# Patient Record
Sex: Female | Born: 1935 | Race: Black or African American | Hispanic: No | Smoking: Never smoker
Health system: Southern US, Community
[De-identification: ages and names within clinical notes are randomized; demographics above are authoritative.]

## PROBLEM LIST (undated history)

## (undated) DIAGNOSIS — T884XXA Failed or difficult intubation, initial encounter: Secondary | ICD-10-CM

## (undated) DIAGNOSIS — T4145XA Adverse effect of unspecified anesthetic, initial encounter: Secondary | ICD-10-CM

## (undated) DIAGNOSIS — I2510 Atherosclerotic heart disease of native coronary artery without angina pectoris: Secondary | ICD-10-CM

## (undated) DIAGNOSIS — I10 Essential (primary) hypertension: Secondary | ICD-10-CM

## (undated) DIAGNOSIS — I509 Heart failure, unspecified: Secondary | ICD-10-CM

## (undated) DIAGNOSIS — K219 Gastro-esophageal reflux disease without esophagitis: Secondary | ICD-10-CM

## (undated) DIAGNOSIS — H8110 Benign paroxysmal vertigo, unspecified ear: Secondary | ICD-10-CM

## (undated) DIAGNOSIS — E669 Obesity, unspecified: Secondary | ICD-10-CM

## (undated) DIAGNOSIS — T8859XA Other complications of anesthesia, initial encounter: Secondary | ICD-10-CM

## (undated) DIAGNOSIS — F419 Anxiety disorder, unspecified: Secondary | ICD-10-CM

## (undated) DIAGNOSIS — M47812 Spondylosis without myelopathy or radiculopathy, cervical region: Secondary | ICD-10-CM

## (undated) DIAGNOSIS — R0602 Shortness of breath: Secondary | ICD-10-CM

## (undated) HISTORY — DX: Essential (primary) hypertension: I10

## (undated) HISTORY — DX: Benign paroxysmal vertigo, unspecified ear: H81.10

## (undated) HISTORY — PX: APPENDECTOMY: SHX54

## (undated) HISTORY — DX: Atherosclerotic heart disease of native coronary artery without angina pectoris: I25.10

## (undated) HISTORY — PX: ABDOMINAL HYSTERECTOMY: SHX81

## (undated) HISTORY — DX: Obesity, unspecified: E66.9

## (undated) HISTORY — PX: OTHER SURGICAL HISTORY: SHX169

## (undated) HISTORY — PX: CATARACT EXTRACTION W/ INTRAOCULAR LENS  IMPLANT, BILATERAL: SHX1307

## (undated) HISTORY — PX: CHOLECYSTECTOMY: SHX55

## (undated) HISTORY — DX: Spondylosis without myelopathy or radiculopathy, cervical region: M47.812

## (undated) HISTORY — DX: Gastro-esophageal reflux disease without esophagitis: K21.9

---

## 1998-01-13 ENCOUNTER — Inpatient Hospital Stay (HOSPITAL_COMMUNITY): Admission: EM | Admit: 1998-01-13 | Discharge: 1998-01-15 | Payer: Self-pay | Admitting: Emergency Medicine

## 1998-08-11 ENCOUNTER — Encounter: Admission: RE | Admit: 1998-08-11 | Discharge: 1998-10-21 | Payer: Self-pay | Admitting: Anesthesiology

## 1998-11-04 ENCOUNTER — Encounter: Admission: RE | Admit: 1998-11-04 | Discharge: 1999-02-02 | Payer: Self-pay | Admitting: Anesthesiology

## 1999-01-12 ENCOUNTER — Ambulatory Visit (HOSPITAL_COMMUNITY): Admission: RE | Admit: 1999-01-12 | Discharge: 1999-01-12 | Payer: Self-pay | Admitting: Orthopedic Surgery

## 1999-01-12 ENCOUNTER — Encounter: Payer: Self-pay | Admitting: Orthopedic Surgery

## 1999-04-20 ENCOUNTER — Encounter: Admission: RE | Admit: 1999-04-20 | Discharge: 1999-06-08 | Payer: Self-pay

## 1999-05-12 ENCOUNTER — Encounter: Payer: Self-pay | Admitting: Orthopedic Surgery

## 1999-05-19 ENCOUNTER — Encounter: Payer: Self-pay | Admitting: Orthopedic Surgery

## 1999-05-19 ENCOUNTER — Inpatient Hospital Stay (HOSPITAL_COMMUNITY): Admission: RE | Admit: 1999-05-19 | Discharge: 1999-05-23 | Payer: Self-pay | Admitting: Orthopedic Surgery

## 1999-06-13 ENCOUNTER — Encounter: Admission: RE | Admit: 1999-06-13 | Discharge: 1999-09-11 | Payer: Self-pay | Admitting: Orthopedic Surgery

## 1999-09-26 ENCOUNTER — Encounter: Admission: RE | Admit: 1999-09-26 | Discharge: 1999-12-25 | Payer: Self-pay | Admitting: Orthopedic Surgery

## 2000-01-18 ENCOUNTER — Emergency Department (HOSPITAL_COMMUNITY): Admission: EM | Admit: 2000-01-18 | Discharge: 2000-01-18 | Payer: Self-pay | Admitting: Emergency Medicine

## 2000-01-18 ENCOUNTER — Encounter: Payer: Self-pay | Admitting: Emergency Medicine

## 2000-06-06 ENCOUNTER — Encounter: Admission: RE | Admit: 2000-06-06 | Discharge: 2000-06-06 | Payer: Self-pay | Admitting: Orthopedic Surgery

## 2000-06-06 ENCOUNTER — Encounter: Payer: Self-pay | Admitting: Orthopedic Surgery

## 2000-08-27 HISTORY — PX: LUMBAR LAMINECTOMY: SHX95

## 2000-09-04 ENCOUNTER — Encounter: Admission: RE | Admit: 2000-09-04 | Discharge: 2000-09-04 | Payer: Self-pay | Admitting: Family Medicine

## 2000-09-04 ENCOUNTER — Encounter: Payer: Self-pay | Admitting: Family Medicine

## 2001-05-29 ENCOUNTER — Encounter: Payer: Self-pay | Admitting: Specialist

## 2001-05-29 ENCOUNTER — Ambulatory Visit (HOSPITAL_COMMUNITY): Admission: RE | Admit: 2001-05-29 | Discharge: 2001-05-29 | Payer: Self-pay | Admitting: Specialist

## 2001-07-09 ENCOUNTER — Emergency Department (HOSPITAL_COMMUNITY): Admission: EM | Admit: 2001-07-09 | Discharge: 2001-07-09 | Payer: Self-pay | Admitting: Emergency Medicine

## 2001-07-10 ENCOUNTER — Encounter: Payer: Self-pay | Admitting: Emergency Medicine

## 2001-08-27 HISTORY — PX: ROTATOR CUFF REPAIR: SHX139

## 2001-09-21 ENCOUNTER — Inpatient Hospital Stay (HOSPITAL_COMMUNITY): Admission: RE | Admit: 2001-09-21 | Discharge: 2001-09-22 | Payer: Self-pay | Admitting: Specialist

## 2002-11-12 ENCOUNTER — Encounter: Admission: RE | Admit: 2002-11-12 | Discharge: 2002-11-12 | Payer: Self-pay | Admitting: Family Medicine

## 2002-11-12 ENCOUNTER — Encounter: Payer: Self-pay | Admitting: Family Medicine

## 2003-06-22 ENCOUNTER — Encounter
Admission: RE | Admit: 2003-06-22 | Discharge: 2003-06-22 | Payer: Self-pay | Admitting: Physical Medicine and Rehabilitation

## 2003-07-04 ENCOUNTER — Inpatient Hospital Stay (HOSPITAL_COMMUNITY): Admission: EM | Admit: 2003-07-04 | Discharge: 2003-07-06 | Payer: Self-pay | Admitting: Emergency Medicine

## 2003-07-05 ENCOUNTER — Encounter (INDEPENDENT_AMBULATORY_CARE_PROVIDER_SITE_OTHER): Payer: Self-pay | Admitting: Cardiology

## 2003-12-25 ENCOUNTER — Emergency Department (HOSPITAL_COMMUNITY): Admission: EM | Admit: 2003-12-25 | Discharge: 2003-12-25 | Payer: Self-pay

## 2004-05-15 ENCOUNTER — Encounter (HOSPITAL_BASED_OUTPATIENT_CLINIC_OR_DEPARTMENT_OTHER): Admission: RE | Admit: 2004-05-15 | Discharge: 2004-05-23 | Payer: Self-pay | Admitting: Internal Medicine

## 2004-07-11 ENCOUNTER — Encounter: Admission: RE | Admit: 2004-07-11 | Discharge: 2004-07-11 | Payer: Self-pay | Admitting: Family Medicine

## 2004-07-22 ENCOUNTER — Emergency Department (HOSPITAL_COMMUNITY): Admission: EM | Admit: 2004-07-22 | Discharge: 2004-07-22 | Payer: Self-pay | Admitting: Emergency Medicine

## 2004-07-25 ENCOUNTER — Ambulatory Visit: Payer: Self-pay | Admitting: Pulmonary Disease

## 2004-08-01 ENCOUNTER — Ambulatory Visit: Payer: Self-pay | Admitting: Pulmonary Disease

## 2004-08-07 ENCOUNTER — Encounter (HOSPITAL_BASED_OUTPATIENT_CLINIC_OR_DEPARTMENT_OTHER): Admission: RE | Admit: 2004-08-07 | Discharge: 2004-08-25 | Payer: Self-pay | Admitting: Internal Medicine

## 2004-09-14 ENCOUNTER — Ambulatory Visit: Payer: Self-pay | Admitting: Internal Medicine

## 2004-09-21 ENCOUNTER — Ambulatory Visit: Payer: Self-pay | Admitting: Internal Medicine

## 2004-10-04 ENCOUNTER — Ambulatory Visit: Payer: Self-pay | Admitting: Internal Medicine

## 2004-10-10 ENCOUNTER — Ambulatory Visit: Payer: Self-pay | Admitting: Internal Medicine

## 2004-10-17 ENCOUNTER — Ambulatory Visit (HOSPITAL_COMMUNITY): Admission: RE | Admit: 2004-10-17 | Discharge: 2004-10-17 | Payer: Self-pay | Admitting: Internal Medicine

## 2004-10-20 ENCOUNTER — Ambulatory Visit: Payer: Self-pay | Admitting: Internal Medicine

## 2004-11-03 ENCOUNTER — Ambulatory Visit: Payer: Self-pay | Admitting: Internal Medicine

## 2004-12-19 ENCOUNTER — Ambulatory Visit: Payer: Self-pay | Admitting: Internal Medicine

## 2004-12-21 ENCOUNTER — Encounter
Admission: RE | Admit: 2004-12-21 | Discharge: 2005-03-21 | Payer: Self-pay | Admitting: Physical Medicine and Rehabilitation

## 2005-01-30 ENCOUNTER — Ambulatory Visit: Payer: Self-pay | Admitting: Internal Medicine

## 2005-06-08 ENCOUNTER — Ambulatory Visit: Payer: Self-pay | Admitting: Internal Medicine

## 2005-07-24 ENCOUNTER — Ambulatory Visit: Payer: Self-pay | Admitting: Internal Medicine

## 2005-09-11 ENCOUNTER — Encounter: Admission: RE | Admit: 2005-09-11 | Discharge: 2005-09-11 | Payer: Self-pay | Admitting: Family Medicine

## 2005-10-22 ENCOUNTER — Ambulatory Visit: Payer: Self-pay | Admitting: Internal Medicine

## 2006-01-15 ENCOUNTER — Ambulatory Visit: Payer: Self-pay | Admitting: Internal Medicine

## 2006-04-16 ENCOUNTER — Ambulatory Visit: Payer: Self-pay | Admitting: Internal Medicine

## 2006-07-15 ENCOUNTER — Ambulatory Visit: Payer: Self-pay | Admitting: Internal Medicine

## 2006-11-08 ENCOUNTER — Encounter: Admission: RE | Admit: 2006-11-08 | Discharge: 2006-11-08 | Payer: Self-pay | Admitting: Family Medicine

## 2007-01-14 ENCOUNTER — Ambulatory Visit: Payer: Self-pay | Admitting: Internal Medicine

## 2007-07-06 ENCOUNTER — Emergency Department (HOSPITAL_COMMUNITY): Admission: EM | Admit: 2007-07-06 | Discharge: 2007-07-06 | Payer: Self-pay | Admitting: Family Medicine

## 2007-07-09 ENCOUNTER — Encounter: Admission: RE | Admit: 2007-07-09 | Discharge: 2007-07-09 | Payer: Self-pay | Admitting: Podiatry

## 2007-07-14 ENCOUNTER — Encounter: Admission: RE | Admit: 2007-07-14 | Discharge: 2007-07-14 | Payer: Self-pay | Admitting: Interventional Radiology

## 2007-07-15 ENCOUNTER — Encounter: Admission: RE | Admit: 2007-07-15 | Discharge: 2007-07-15 | Payer: Self-pay | Admitting: Interventional Radiology

## 2007-09-14 ENCOUNTER — Emergency Department (HOSPITAL_COMMUNITY): Admission: EM | Admit: 2007-09-14 | Discharge: 2007-09-14 | Payer: Self-pay | Admitting: Family Medicine

## 2007-12-02 ENCOUNTER — Encounter: Admission: RE | Admit: 2007-12-02 | Discharge: 2007-12-02 | Payer: Self-pay | Admitting: Family Medicine

## 2007-12-18 ENCOUNTER — Encounter: Admission: RE | Admit: 2007-12-18 | Discharge: 2007-12-18 | Payer: Self-pay | Admitting: Family Medicine

## 2008-01-07 ENCOUNTER — Ambulatory Visit: Payer: Self-pay | Admitting: Internal Medicine

## 2008-01-07 DIAGNOSIS — J45909 Unspecified asthma, uncomplicated: Secondary | ICD-10-CM

## 2008-01-23 ENCOUNTER — Ambulatory Visit: Payer: Self-pay | Admitting: Internal Medicine

## 2008-12-02 ENCOUNTER — Encounter: Payer: Self-pay | Admitting: Internal Medicine

## 2008-12-02 ENCOUNTER — Encounter: Admission: RE | Admit: 2008-12-02 | Discharge: 2008-12-02 | Payer: Self-pay | Admitting: Family Medicine

## 2009-04-12 LAB — CONVERTED CEMR LAB
ALT: 15 units/L
AST: 15 units/L
Cholesterol: 192 mg/dL
Hgb A1c MFr Bld: 7 %
Total Bilirubin: 0.4 mg/dL

## 2009-04-22 ENCOUNTER — Ambulatory Visit: Payer: Self-pay | Admitting: Internal Medicine

## 2009-06-07 ENCOUNTER — Ambulatory Visit: Payer: Self-pay | Admitting: Internal Medicine

## 2009-06-07 DIAGNOSIS — R109 Unspecified abdominal pain: Secondary | ICD-10-CM | POA: Insufficient documentation

## 2009-06-07 DIAGNOSIS — K219 Gastro-esophageal reflux disease without esophagitis: Secondary | ICD-10-CM | POA: Insufficient documentation

## 2009-06-07 DIAGNOSIS — J9610 Chronic respiratory failure, unspecified whether with hypoxia or hypercapnia: Secondary | ICD-10-CM | POA: Insufficient documentation

## 2009-06-09 ENCOUNTER — Encounter: Payer: Self-pay | Admitting: Internal Medicine

## 2009-06-16 ENCOUNTER — Encounter: Payer: Self-pay | Admitting: Internal Medicine

## 2009-06-21 ENCOUNTER — Ambulatory Visit: Payer: Self-pay | Admitting: Internal Medicine

## 2009-06-22 ENCOUNTER — Ambulatory Visit: Payer: Self-pay | Admitting: Internal Medicine

## 2009-06-22 DIAGNOSIS — E785 Hyperlipidemia, unspecified: Secondary | ICD-10-CM

## 2009-06-22 DIAGNOSIS — R5383 Other fatigue: Secondary | ICD-10-CM

## 2009-06-22 DIAGNOSIS — I10 Essential (primary) hypertension: Secondary | ICD-10-CM

## 2009-06-22 DIAGNOSIS — E119 Type 2 diabetes mellitus without complications: Secondary | ICD-10-CM | POA: Insufficient documentation

## 2009-06-22 DIAGNOSIS — R5381 Other malaise: Secondary | ICD-10-CM | POA: Insufficient documentation

## 2009-06-22 LAB — CONVERTED CEMR LAB
ALT: 13 units/L (ref 0–35)
Albumin: 4 g/dL (ref 3.5–5.2)
BUN: 12 mg/dL (ref 6–23)
CO2: 24 meq/L (ref 19–32)
Calcium: 9.7 mg/dL (ref 8.4–10.5)
Chloride: 101 meq/L (ref 96–112)
Creatinine, Ser: 1.01 mg/dL (ref 0.40–1.20)
Potassium: 3.7 meq/L (ref 3.5–5.3)

## 2009-06-27 ENCOUNTER — Encounter: Payer: Self-pay | Admitting: Internal Medicine

## 2009-07-07 ENCOUNTER — Ambulatory Visit: Payer: Self-pay | Admitting: Internal Medicine

## 2009-07-07 LAB — CONVERTED CEMR LAB
OCCULT 1: NEGATIVE
OCCULT 3: NEGATIVE

## 2009-08-10 ENCOUNTER — Ambulatory Visit: Payer: Self-pay | Admitting: Internal Medicine

## 2009-08-10 ENCOUNTER — Ambulatory Visit (HOSPITAL_COMMUNITY): Admission: RE | Admit: 2009-08-10 | Discharge: 2009-08-10 | Payer: Self-pay | Admitting: Internal Medicine

## 2009-08-10 DIAGNOSIS — R142 Eructation: Secondary | ICD-10-CM

## 2009-08-10 DIAGNOSIS — R141 Gas pain: Secondary | ICD-10-CM | POA: Insufficient documentation

## 2009-08-10 DIAGNOSIS — R143 Flatulence: Secondary | ICD-10-CM

## 2009-09-01 ENCOUNTER — Encounter: Payer: Self-pay | Admitting: Internal Medicine

## 2009-09-14 ENCOUNTER — Ambulatory Visit: Payer: Self-pay | Admitting: Internal Medicine

## 2009-09-14 ENCOUNTER — Ambulatory Visit (HOSPITAL_COMMUNITY): Admission: RE | Admit: 2009-09-14 | Discharge: 2009-09-14 | Payer: Self-pay | Admitting: Internal Medicine

## 2009-09-14 DIAGNOSIS — R0602 Shortness of breath: Secondary | ICD-10-CM

## 2009-09-14 LAB — CONVERTED CEMR LAB
Basophils Absolute: 0 10*3/uL (ref 0.0–0.1)
Basophils Relative: 1 % (ref 0–1)
Cholesterol: 193 mg/dL (ref 0–200)
Hemoglobin: 12.3 g/dL (ref 12.0–15.0)
Lymphocytes Relative: 34 % (ref 12–46)
MCHC: 31.9 g/dL (ref 30.0–36.0)
Monocytes Absolute: 0.6 10*3/uL (ref 0.1–1.0)
Neutro Abs: 4.2 10*3/uL (ref 1.7–7.7)
Neutrophils Relative %: 54 % (ref 43–77)
RDW: 15 % (ref 11.5–15.5)
Triglycerides: 95 mg/dL (ref <150)
VLDL: 19 mg/dL (ref 0–40)

## 2009-09-26 ENCOUNTER — Ambulatory Visit (HOSPITAL_COMMUNITY): Admission: RE | Admit: 2009-09-26 | Discharge: 2009-09-26 | Payer: Self-pay | Admitting: Internal Medicine

## 2009-09-28 ENCOUNTER — Ambulatory Visit (HOSPITAL_COMMUNITY): Admission: RE | Admit: 2009-09-28 | Discharge: 2009-09-28 | Payer: Self-pay | Admitting: Internal Medicine

## 2009-09-28 ENCOUNTER — Encounter: Payer: Self-pay | Admitting: Internal Medicine

## 2009-10-06 ENCOUNTER — Telehealth: Payer: Self-pay | Admitting: Internal Medicine

## 2009-11-23 ENCOUNTER — Emergency Department (HOSPITAL_COMMUNITY): Admission: EM | Admit: 2009-11-23 | Discharge: 2009-11-23 | Payer: Self-pay | Admitting: Emergency Medicine

## 2009-11-23 ENCOUNTER — Emergency Department (HOSPITAL_COMMUNITY): Admission: EM | Admit: 2009-11-23 | Discharge: 2009-11-23 | Payer: Self-pay | Admitting: Family Medicine

## 2009-12-08 ENCOUNTER — Encounter: Admission: RE | Admit: 2009-12-08 | Discharge: 2009-12-08 | Payer: Self-pay | Admitting: Endocrinology

## 2010-04-07 ENCOUNTER — Ambulatory Visit: Payer: Self-pay | Admitting: Internal Medicine

## 2010-04-21 ENCOUNTER — Ambulatory Visit: Payer: Self-pay | Admitting: Internal Medicine

## 2010-06-02 ENCOUNTER — Ambulatory Visit: Payer: Self-pay | Admitting: Internal Medicine

## 2010-06-02 DIAGNOSIS — J310 Chronic rhinitis: Secondary | ICD-10-CM

## 2010-07-05 ENCOUNTER — Ambulatory Visit: Payer: Self-pay | Admitting: Internal Medicine

## 2010-08-26 ENCOUNTER — Emergency Department (HOSPITAL_COMMUNITY)
Admission: EM | Admit: 2010-08-26 | Discharge: 2010-08-26 | Payer: Self-pay | Source: Home / Self Care | Admitting: Family Medicine

## 2010-09-11 ENCOUNTER — Ambulatory Visit: Admit: 2010-09-11 | Payer: Self-pay | Admitting: Internal Medicine

## 2010-09-17 ENCOUNTER — Encounter: Payer: Self-pay | Admitting: Family Medicine

## 2010-09-18 ENCOUNTER — Encounter: Payer: Self-pay | Admitting: Internal Medicine

## 2010-09-19 ENCOUNTER — Ambulatory Visit
Admission: RE | Admit: 2010-09-19 | Discharge: 2010-09-19 | Payer: Self-pay | Source: Home / Self Care | Attending: Internal Medicine | Admitting: Internal Medicine

## 2010-09-26 NOTE — Assessment & Plan Note (Signed)
Summary: F/U/EST/VS   Vital Signs:  Patient profile:   75 year old female Height:      67 inches Weight:      220.0 pounds BMI:     34.58 O2 Sat:      99 % on Room air Temp:     97.8 degrees F oral Pulse rate:   70 / minute BP sitting:   128 / 76  (right arm)  Vitals Entered By: Filomena Jungling NT II (September 14, 2009 9:14 AM)  O2 Flow:  Room air CC: follow-up visit  and now with cough, patient has seen Dr. Evlyn Kanner Is Patient Diabetic? Yes Did you bring your meter with you today? Yes Pain Assessment Patient in pain? no      Nutritional Status BMI of > 30 = obese  Does patient need assistance? Functional Status Self care Ambulation Normal   Primary Care Provider:  Dorothe Pea  CC:  follow-up visit  and now with cough and patient has seen Dr. Evlyn Kanner.  History of Present Illness: Patient presents for follow-up of her diabetes mellitus, hypertension, hyperlipidemia, and other chronic medical problems.  Her main complaint is dyspnea aggravated by exertion which is chronic but worse over the past few months.  She reports that she ran out of her Pulmicort about one week ago and has not yet had it refilled.  She has had no further low blood sugars since I decreased her insulin dose.  She reports that she has seen her endocrinologist Dr. Evlyn Kanner, who agreed with the reduction in the insulin dose.  She has continued to have the sensation of upper abdominal fullness and discomfort.  Preventive Screening-Counseling & Management  Alcohol-Tobacco     Alcohol drinks/day: 0     Smoking Status: never  Allergies (verified): 1)  Asa 2)  Tylox 3)  Sulfa 4)  * Meds With Red Dye  Review of Systems General:  Denies chills, fever, and sweats. CV:  Complains of shortness of breath with exertion; denies chest pain or discomfort, difficulty breathing at night, difficulty breathing while lying down, and swelling of feet. Resp:  Complains of cough, shortness of breath, and wheezing; denies sputum  productive; cough has been present for 1 week. GI:  Denies abdominal pain, bloody stools, dark tarry stools, nausea, and vomiting. GU:  Denies dysuria.  Physical Exam  General:  alert, no distress Lungs:  normal respiratory effort and no crackles; there are scattered mild expiratory wheezes   Heart:  normal rate, regular rhythm, no murmur, no gallop, and no rub.   Abdomen:  soft, normal bowel sounds, no distention, and no masses; mild right upper quadrant tenderness; no apparent hepatomegaly or splenomegaly Extremities:  no edema   Impression & Recommendations:  Problem # 1:  DYSPNEA (ICD-786.05) Patient has a history of asthma, and I think her shortness of breath is likely related to inadequate control.  I advised her to have her Pulmicort inhaler refilled and to follow-up with her pulmonologist Dr. Sherene Sires.  A chest x-ray today showed no acute process, and she has no signs of congestive heart failure by exam or chest x-ray.  Orders: T-CBC w/Diff ([id number]) CXR- 2view (CXR)  Problem # 2:  ABDOMINAL PAIN, UNSPECIFIED (ICD-789.00) Patient has continued upper abdominal discomfort and a sensation of fullness, with some mild right upper quadrant tenderness on exam today.  The plan is to obtain an abdominal ultrasound.  Labs obtained at her last prior visit to work this up were unrevealing.  Her updated  medication list for this problem includes:    Gas-x Extra Strength 125 Mg Caps (Simethicone) .Marland Kitchen... Take with meals  Orders: Ultrasound (Ultrasound)  Problem # 3:  DIABETES MELLITUS, TYPE II (ICD-250.00) Patient is doing well on her current regimen.  The plan is to obtain a copy of her last office visit from Dr. Evlyn Kanner, and continue current medication regimen.  Her updated medication list for this problem includes:    Metformin Hcl 500 Mg Tabs (Metformin hcl) .Marland Kitchen... Take 1 tablet by mouth two times a day    Diovan 320 Mg Tabs (Valsartan) .Marland Kitchen... Take 1/2 tablet by mouth once a day     Humalog Mix 75/25 75-25 % Susp (Insulin lispro prot & lispro) .Marland Kitchen... 22 units at bedtime  Orders: T-Urine Microalbumin w/creat. ratio [phone])  Labs Reviewed: Creat: 1.01 (06/22/2009)    Reviewed HgBA1c results: 7.4 (06/22/2009)  7.0 (04/12/2009)  Problem # 4:  HYPERTENSION (ICD-401.9) Patient's blood pressure is well controlled on current regimen.  Plan is to continue current antihypertensive medications, and follow up blood pressure at next visit.   Her updated medication list for this problem includes:    Hydrochlorothiazide 25 Mg Tabs (Hydrochlorothiazide) .Marland Kitchen... Take 1 tablet by mouth once a day    Diovan 320 Mg Tabs (Valsartan) .Marland Kitchen... Take 1/2 tablet by mouth once a day  BP today: 128/76 Prior BP: 146/79 (08/10/2009)  Labs Reviewed: K+: 3.7 (06/22/2009) Creat: : 1.01 (06/22/2009)   Chol: 192 (04/12/2009)   HDL: 63 (04/12/2009)   LDL: 109 (04/12/2009)   TG: 98 (04/12/2009)  Problem # 5:  HYPERLIPIDEMIA (ICD-272.4) Patient is doing well on her statin medication with no apparent side effects.  The plan is to check a fasting lipid profile.  Her updated medication list for this problem includes:    Simvastatin 40 Mg Tabs (Simvastatin) .Marland Kitchen... Take 1 tab by mouth at bedtime  Orders: T-Lipid Profile [id number])  Labs Reviewed: SGOT: 12 (06/22/2009)   SGPT: 13 (06/22/2009)   HDL:63 (04/12/2009)  LDL:109 (04/12/2009)  Chol:192 (04/12/2009)  Trig:98 (04/12/2009)  Complete Medication List: 1)  Metformin Hcl 500 Mg Tabs (Metformin hcl) .... Take 1 tablet by mouth two times a day 2)  Calcium Carbonate-vitamin D 600-400 Mg-unit Tabs (Calcium carbonate-vitamin d) .... Take 1 tablet by mouth two times a day 3)  Hydrochlorothiazide 25 Mg Tabs (Hydrochlorothiazide) .... Take 1 tablet by mouth once a day 4)  Diovan 320 Mg Tabs (Valsartan) .... Take 1/2 tablet by mouth once a day 5)  Oxygen 2 Liters  .... At bedtime 6)  Humalog Mix 75/25 75-25 % Susp (Insulin lispro prot & lispro)  .... 22 units at bedtime 7)  Clonazepam 1 Mg Tabs (Clonazepam) .... Take 1 tablet by mouth two times a day 8)  Symbicort 80-4.5 Mcg/act Aero (Budesonide-formoterol fumarate) .... Inhale 2 puffs two times a day 9)  Dexilant 60 Mg Cpdr (Dexlansoprazole) .Marland Kitchen.. 1 before meal once daily 10)  Omeprazole 20 Mg Cpdr (Omeprazole) .Marland Kitchen.. 1 before meal once daily 11)  Vitamin D (ergocalciferol) 50000 Unit Caps (Ergocalciferol) .Marland Kitchen.. 1 capsule every tuesday and friday 12)  Simvastatin 40 Mg Tabs (Simvastatin) .... Take 1 tab by mouth at bedtime 13)  Citrucel 500 Mg Tabs (Methylcellulose (laxative)) .... Once daily 14)  Proair Hfa 108 (90 Base) Mcg/act Aers (Albuterol sulfate) .... 2 puffs every 4-6 hours as needed 15)  Mucinex Dm 30-600 Mg Tb12 (Dextromethorphan-guaifenesin) .Marland Kitchen.. 1-2 every 12 hours as needed 16)  Tramadol Hcl 50 Mg Tabs (Tramadol hcl) .Marland KitchenMarland KitchenMarland Kitchen  1 every 4 hours as needed 17)  Stool Softener 100 Mg Caps (Docusate sodium) .... Take 1 capsule by mouth at bedtime 18)  Miralax Powd (Polyethylene glycol 3350) .Marland Kitchen.. 1 capful at night as needed 19)  Gas-x Extra Strength 125 Mg Caps (Simethicone) .... Take with meals  Other Orders: Dexa scan (Dexa scan)  Patient Instructions: 1)  Please schedule a follow-up appointment in 1 month. 2)  Please refill your Pulmicort and use it as prescribed. 3)  Please make a follow-up appointment with Dr. Sherene Sires. 4)  Please keep appointments for abdominal ultrasound and DEXA bone density scan. Process Orders Check Orders Results:     [facility]: Check successful Tests Sent for requisitioning (September 15, 2009 7:26 PM):     09/14/2009: [facility] -- T-Urine Microalbumin w/creat. ratio [[id number]] (signed)     09/14/2009: [facility] -- T-Lipid Profile [id number] (signed)     09/14/2009: [facility] -- T-CBC w/Diff [[id number]] (signed)    Prevention & Chronic Care Immunizations   Influenza  vaccine: Fluvax 3+  (06/21/2009)   Influenza vaccine due: 04/27/2010    Tetanus booster: Not documented    Pneumococcal vaccine: Historical  (08/28/2007)    H. zoster vaccine: Not documented  Colorectal Screening   Hemoccult: Negative X3  (07/07/2009)   Hemoccult action/deferral: Ordered  (06/22/2009)    Colonoscopy: Not documented   Colonoscopy action/deferral: Deferred  (06/22/2009)  Other Screening   Pap smear: Not documented   Pap smear action/deferral: Not indicated S/P hysterectomy  (06/22/2009)    Mammogram: No specific mammographic evidence of malignancy. Assessment: Negative - BIRADS 1.   (12/02/2008)   Mammogram due: 11/2009    DXA bone density scan: Not documented   DXA bone density action/deferral: Ordered  (09/14/2009)   Smoking status: never  (09/14/2009)  Diabetes Mellitus   HgbA1C: 7.4  (06/22/2009)   HgbA1C action/deferral: Ordered  (06/22/2009)    Eye exam: Not documented    Foot exam: yes  (06/22/2009)   Foot exam action/deferral: Do today   High risk foot: No  (06/22/2009)   Foot care education: Done  (06/22/2009)    Urine microalbumin/creatinine ratio: Not documented   Urine microalbumin action/deferral: Ordered    Diabetes flowsheet reviewed?: Yes   Progress toward A1C goal: Unchanged  Lipids   Total Cholesterol: 192  (04/12/2009)   Lipid panel action/deferral: Lipid Panel ordered   LDL: 109  (04/12/2009)   LDL Direct: Not documented   HDL: 63  (04/12/2009)   Triglycerides: Not documented    SGOT (AST): 12  (06/22/2009)   SGPT (ALT): 13  (06/22/2009)   Alkaline phosphatase: 94  (06/22/2009)   Total bilirubin: 0.3  (06/22/2009)    Lipid flowsheet reviewed?: Yes   Progress toward LDL goal: Unchanged  Hypertension   Last Blood Pressure: 128 / 76  (09/14/2009)   Serum creatinine: 1.01  (06/22/2009)   Serum potassium 3.7  (06/22/2009)    Hypertension flowsheet reviewed?: Yes   Progress toward BP goal: At goal  Self-Management  Support :   Personal Goals (by the next clinic visit) :     Personal A1C goal: 7  (06/22/2009)     Personal blood pressure goal: 130/80  (06/22/2009)     Personal LDL goal: 100  (06/22/2009)    Patient will work on the following items until the next clinic visit to reach self-care goals:     Medications and monitoring: take my medicines every day, check my blood sugar  (  09/14/2009)     Eating: drink diet soda or water instead of juice or soda, eat more vegetables, use fresh or frozen vegetables, eat foods that are low in salt, eat baked foods instead of fried foods, eat fruit for snacks and desserts  (09/14/2009)     Home glucose monitoring frequency: 3 times a day  (06/22/2009)    Diabetes self-management support: Copy of home glucose meter record, Written self-care plan  (09/14/2009)   Diabetes care plan printed    Hypertension self-management support: Written self-care plan  (09/14/2009)   Hypertension self-care plan printed.    Lipid self-management support: Written self-care plan  (09/14/2009)   Lipid self-care plan printed.   Nursing Instructions: Schedule screening DXA bone density scan (see order)

## 2010-09-26 NOTE — Assessment & Plan Note (Signed)
Summary: Pulmonary/ acute ext ov with hfa now 90%   Primary Provider/Referring Provider:  Dorothe [name]  CC:  Acute visit.  Pt c/o chest tightness and cough x 1 wk- cough is prod with yellow sputum.  Has had chills but no fever.  She wheezes when lies down and sometimes with exertion and .  History of Present Illness: 75 yobf never smoker with  known history of  " Difficult t-to-control asthma," dating all the way back to the late 1990's typically flares with non-adherence with GERD rx  April 22, 2009 ov not seen > year doing well on symbicort as needed maybe once daily but once the weather got hot (x sev months)  started using  symbicort 80 4 puffs in 24 hours and just one albuterol. dyspnea and cough more daytime than night but sometimes it  wakes her  up at night recently also, no excess mucus.  rec Work on inhaler technique:  relax and blow all the way out then take a nice smooth deep breath back in, triggering the inhaler at same time you start breathing in  symbicort 80 2 puffs first thing  in am and 2 puffs again in pm about 12 hours later if needed (the second dose is optional) prevacid 15 mg Take one 30-60 min before first and last meals of the day automatically   June 21, 2009--Returns for follow up and med review. Last visit recommended to add citrucel to regimen however she forgot to go get. Finances play a big role into her meds, she can get generics rx for $5. She was started on prevacid but it is not working. Continues to have upper abd fullness, bloating, and constipation. Her meds were reviewed and pt education was given. no sob or cough  April 07, 2010  Acute visit. Pt c/o increased SOB since the Spring 2011- states that this has been worse x 1 wk with heavy feeling in chest. She gets SOB walking from one room to the next.  Heat makes breathing worse.  She also c/o dry cough- mainly at bedtime but not waking up once asleep or early in am with resp c/o's . --tx w/ steroid taper.    April 21, 2010 --Presents for follow up and med review. We reviewed his meds and updated calendar.  .  states SOB has improved since last OV, having prod cough at bedtime.Marland Kitchen Has some intermittent abdominal discomfort w/ bloating and gas. Last visit w/ asthmatic flare tx w/ steroids, she is improving w/ decreased dyspnea/cough but not back to baseline yet.   June 02, 2010 Acute visit.  Pt c/o chest tightness and cough x 1 wk- cough is prod with yellow sputum.  Has had chills but no fever.  She wheezes when lies down and sometimes with exertion.  all this occurred when ran out of ppi assoc with sensation of pnds but no noct awakening or perceived increase need for saba.  Pt denies any significant sore throat, dysphagia, itching, sneezing,  nasal congestion or excess secretions,  fever, chills, sweats, unintended wt loss, pleuritic or exertional cp, hempoptysis, change in activity tolerance  orthopnea pnd or leg swelling.  Pt also denies any obvious fluctuation in symptoms with weather or environmental change or other alleviating or aggravating factors.       Current Medications (verified): 1)  Metformin Hcl 500 Mg  Tabs (Metformin Hcl) .... Take 1 Tablet By Mouth Two Times A Day 2)  Calcium-Vitamin D 500-200 Mg-Unit Tabs (Calcium Carbonate-Vitamin D) .Marland KitchenMarland KitchenMarland Kitchen  Take One By Mouth Two Times A Day 3)  Hydrochlorothiazide 25 Mg  Tabs (Hydrochlorothiazide) .... Take 1 Tablet By Mouth Once A Day 4)  Diovan 160 Mg Tabs (Valsartan) .... Take One By Mouth Once Daily 5)  Oxygen 2 Liters .... At Bedtime 6)  Humalog Mix 75/25 75-25 % Susp (Insulin Lispro Prot & Lispro) .... 24 Units At Bedtime 7)  Clonazepam 2 Mg Tabs (Clonazepam) .... Take One By Mouth Two Times A Day 8)  Symbicort 80-4.5 Mcg/act  Aero (Budesonide-Formoterol Fumarate) .... Inhale 2 Puffs Two Times A Day 9)  Aciphex 20 Mg  Tbec (Rabeprazole Sodium) .... Take  One 30-60 Min Before First Meal of The Day Ran Out of Samples 10)  Vitamin D  (Ergocalciferol) 50000 Unit Caps (Ergocalciferol) .Marland Kitchen.. 1 Capsule Every Tuesday and Friday 11)  Simvastatin 40 Mg Tabs (Simvastatin) .... Take 1 Tab By Mouth At Bedtime 12)  Citrucel 500 Mg Tabs (Methylcellulose (Laxative)) .... Once Daily 13)  Pepcid Ac Maximum Strength 20 Mg Tabs (Famotidine) .... One At Bedtime- Not Taking 14)  Probiotic  Caps (Probiotic Product) .... Take One By Mouth Once Daily 15)  Proair Hfa 108 (90 Base) Mcg/act  Aers (Albuterol Sulfate) .... 2 Puffs Every 4-6 Hours As Needed 16)  Mucinex Dm 30-600 Mg  Tb12 (Dextromethorphan-Guaifenesin) .Marland Kitchen.. 1-2 Every 12 Hours As Needed 17)  Tramadol Hcl 50 Mg Tabs (Tramadol Hcl) .... Take One Every 4 Hrs As Needed "never Taken This- Never Called To Pharm" 18)  Stool Softener 100 Mg Caps (Docusate Sodium) .... Take 1 Capsule By Mouth At Bedtime 19)  Miralax  Powd (Polyethylene Glycol 3350) .Marland Kitchen.. 1 Capful At Night As Needed 20)  Gas-X Extra Strength 125 Mg Caps (Simethicone) .... Take With Meals 21)  Fluticasone Propionate 50 Mcg/act Susp (Fluticasone Propionate) .... 2 Sprays Each Nostril Once Daily  Allergies (verified): 1)  Asa 2)  Tylox 3)  Sulfa 4)  * Meds With Red Dye  Past History:  Past Medical History: ASTHMA (ICD-493.90)    - HFA  25% June 07, 2009  > 75% April 08, 2010 > 90% June 02, 2010      -overnight ox 10/21 2% O2 <88%, cont on on nocturnal O2 GERD  clinical dx OBESITY Meds reviewed with pt education and computerized med calendar completed/adjusted. redone June 21, 2009  Diabetes mellitus, type II Hypertension Noncritical coronary artery disease by cardiac catheterization in 1999, with branch vessel coronary artery disease affecting the first obtuse marginal branch.  Vital Signs:  Patient profile:   75 year old female Weight:      224 pounds O2 Sat:      97 % on Room air Temp:     98.3 degrees F oral Pulse rate:   66 / minute BP sitting:   108 / 64  (left arm)  Vitals Entered By: Vernie Murders  (June 02, 2010 10:30 AM)  O2 Flow:  Room air  Physical Exam  Additional Exam:  wt 222 April 22, 2009  > 224 June 07, 2009 > 218 April 08, 2010 >>221 April 21, 2010 > 224 June 02, 2010  amb hoarse bf nad  pseudowheeze resolves with purse lip maneuver  HEENT: nl dentition, turbinates, and orophanx. Nl external ear canals without cough reflex NECK :  without JVD/Nodes/TM/ nl carotid upstrokes bilaterally LUNGS: no acc muscle use, clear to A and P bilaterally without cough on insp or exp maneuvers CV:  RRR  no s3 or murmur  or increase in P2, no edema   ABD:  soft and nontender with nl excursion in the supine position. No bruits or organomegaly, bowel sounds nl, no guarding or rebound.  MS:  warm without deformities, calf tenderness, cyanosis or clubbing     Impression & Recommendations:  Problem # 1:  ASTHMA (ICD-493.90)   DDX of  difficult airways managment all start with A and  include Adherence, Ace Inhibitors, Acid Reflux, Active Sinus Disease, Alpha 1 Antitripsin deficiency, Anxiety masquerading as Airways dz,  ABPA,  allergy(esp in young), Aspiration (esp in elderly), Adverse effects of DPI,  Active smokers, plus one B  = Beta blocker use..   Adherence better to symbicort but not to ppi.  This has been a recurrent theme so try to move the mountain to [facility] and leave off ppi and try h2 two times a day on a trial basis only  I spent extra time with the patient today explaining optimal mdi  technique.  This improved from  75-90%   Each maintenance medication was reviewed in detail including most importantly the difference between maintenance and as needed and under what circumstances the prns are to be used - This was done in the context of a medication calendar review which provided the patient with a user-friendly unambiguous mechanism for medication administration and reconciliation and provides an action plan for all active problems. It is critical that this be shown to  every doctor  for modification during the office visit if necessary so the patient can use it as a working document.    Problem # 2:  GERD (ICD-530.81)  The following medications were removed from the medication list:    Aciphex 20 Mg Tbec (Rabeprazole sodium) .Marland Kitchen... Take  one 30-60 min before first meal of the day ran out of samples Her updated medication list for this problem includes:    Pepcid Ac Maximum Strength 20 Mg Tabs (Famotidine) ..... One after breakfast and one at bedtime  Diet also reviewed.   Orders: Est. Patient Level IV (41324)  Problem # 3:  CHRONIC RHINITIS (ICD-472.0)  On flonase trial for what amounts to a form of what is likely Upper airway cough syndrome, so named because it's frequently impossible to sort out how much is  CR/sinusitis with freq throat clearing (which can be related to primary GERD)   vs  causing  secondary extra esophageal GERD from wide swings in gastric pressure that occur with throat clearing, promoting self use of mint and menthol lozenges that reduce the lower esophageal sphincter tone and exacerbate the problem further These are the same pts who not infrequently have failed to tolerate ace inhibitors,  dry powder inhalers or biphosphonates or report having reflux symptoms that don't respond to standard doses of PPI   For now leave on nasal steroids with low threshold to treat gerd more aggressively if symptoms of pnds persist on pepcid plus flonase  Orders: Est. Patient Level IV (40102)  Medications Added to Medication List This Visit: 1)  Aciphex 20 Mg Tbec (Rabeprazole sodium) .... Take  one 30-60 min before first meal of the day ran out of samples 2)  Pepcid Ac Maximum Strength 20 Mg Tabs (Famotidine) .... One at bedtime- not taking 3)  Pepcid Ac Maximum Strength 20 Mg Tabs (Famotidine) .... One after breakfast and one at bedtime 4)  Tramadol Hcl 50 Mg Tabs (Tramadol hcl) .Marland Kitchen.. 1 - 2 every 4 hours for cough 5)  Tramadol Hcl 50 Mg Tabs  (  Tramadol hcl) .... Take one every 4 hrs as needed "never taken this- never called to pharm" 6)  Fluticasone Propionate 50 Mcg/act Susp (Fluticasone propionate) .... 2 sprays each nostril once daily 7)  Prednisone 10 Mg Tabs (Prednisone) .... 4 each am x 2days, 2x2days, 1x2days and stop  Other Orders: Prescription Created Electronically [phone]) HFA Instruction [phone])  Patient Instructions: 1)  Ok to leave off aciphex 2)  Increase pepcid to 20 mg twice daily 3)  Continue flonase one twice daily 4)  for cough mucinex dm up 2  every 12 hours and if still coughing use tramadol 5)  Prednisone 4 each am x 2days, 2x2days, 1x2days and stop 6)  See calendar for specific medication instructions and bring it back for each and every office visit for every healthcare provider you see.  Without it,  you may not receive the best quality medical care that we feel you deserve.  7)  See Tammy NP w/in 4 weeks with all your medications, even over the counter meds, separated in two separate bags, the ones you take no matter what vs the ones you stop once you feel better and take only as needed.  She will generate for you a new user friendly medication calendar that will put [place] all on the same page re: your medication use.  Prescriptions: PREDNISONE 10 MG  TABS (PREDNISONE) 4 each am x 2days, 2x2days, 1x2days and stop  #14 x 0   Entered and Authorized by:   Nyoka Cowden MD   Signed by:   Nyoka Cowden MD on 06/02/2010   Method used:   Electronically to        [facility]  [address] [phone]* (retail)       [address]       [place], [place]  [id number]       Ph: [phone] or [phone]       Fax: [phone]   RxID:   [id number] TRAMADOL HCL 50 MG TABS (TRAMADOL HCL) 1 - 2 every 4 hours for cough  #40 x 0   Entered and Authorized by:   Nyoka Cowden MD   Signed by:   Nyoka Cowden MD on 06/02/2010   Method used:   [facility] to        [facility]  [address]  [phone]* (retail)       [address]       [place], [place]  [id number]       Ph: [phone] or [phone]       Fax: [phone]   RxID:   [id number]

## 2010-09-26 NOTE — Progress Notes (Signed)
Summary: TEST RESULTS  Phone Note Call from Patient Call back at Home Phone [phone]   Reason for Call: Talk to Doctor, Lab or Test Results Summary of Call: MRS. Tzeng CALLED WOULD LIKE FOR DR.Evanny Ellerbe TO CALL HER ABOUT TEST RESULTS THAT SHE HAD DONE. I WILL GET IN TOUCH WITH DR. Meredith Pel FOR THE PATIENT. Initial call taken by: Filomena Jungling NT II,  October 06, 2009 4:15 PM  Follow-up for Phone Call        I called patient and discussed the results of her recent DEXA scan and abdominal ultrasound.  She will follow up with me on February 23rd. Follow-up by: Margarito Liner MD,  October 06, 2009 5:05 PM

## 2010-09-26 NOTE — Assessment & Plan Note (Signed)
Summary: Pulmonary/ acute ext ov with hfa 75% effective   Primary Annikah Lovins/Referring Pericles Carmicheal:  Dorothe Pea  CC:  Acute visit. Pt c/o increased SOB since the Spring 2011- states that this has been worse x 1 wk with heavy feeling in chest. She gets SOB walking from one room to the next.  Heat makes breathing worse.  She also c/o dry cough- mainly at bedtime.  Marland Kitchen  History of Present Illness: 75 yobf never smoker with  known history of  " Difficult t-to-control asthma," dating all the way back to the late 1990's typically flares with non-adherence with GERD rx  April 22, 2009 ov not seen > year doing well on symbicort as needed maybe once daily but once the weather got hot (x sev months)  started using  symbicort 80 4 puffs in 24 hours and just one albuterol. dyspnea and cough more daytime than night but sometimes it  wakes her  up at night recently also, no excess mucus.  rec Work on inhaler technique:  relax and blow all the way out then take a nice smooth deep breath back in, triggering the inhaler at same time you start breathing in  symbicort 80 2 puffs first thing  in am and 2 puffs again in pm about 12 hours later if needed (the second dose is optional) prevacid 15 mg Take one 30-60 min before first and last meals of the day automatically   June 21, 2009--Returns for follow up and med review. Last visit recommended to add citrucel to regimen however she forgot to go get. Finances play a big role into her meds, she can get generics rx for $5. She was started on prevacid but it is not working. Continues to have upper abd fullness, bloating, and constipation. Her meds were reviewed and pt education was given. no sob or cough  April 07, 2010  Acute visit. Pt c/o increased SOB since the Spring 2011- states that this has been worse x 1 wk with heavy feeling in chest. She gets SOB walking from one room to the next.  Heat makes breathing worse.  She also c/o dry cough- mainly at bedtime but not waking  up once asleep or early in am with resp c/o's .  no purulent secretions. confused again with meds, lost calendar. Not able to confirm med rec    Current Medications (verified): 1)  Metformin Hcl 500 Mg  Tabs (Metformin Hcl) .... Take 1 Tablet By Mouth Two Times A Day 2)  Hydrochlorothiazide 25 Mg  Tabs (Hydrochlorothiazide) .... Take 1 Tablet By Mouth Once A Day 3)  Diovan 320 Mg Tabs (Valsartan) .... Take 1/2 Tablet By Mouth Once A Day 4)  Oxygen 2 Liters .... At Bedtime 5)  Humalog Mix 75/25 75-25 % Susp (Insulin Lispro Prot & Lispro) .... 24 Units At Bedtime 6)  Clonazepam 1 Mg  Tabs (Clonazepam) .... Take 1 Tablet By Mouth Two Times A Day 7)  Symbicort 80-4.5 Mcg/act  Aero (Budesonide-Formoterol Fumarate) .... Inhale 2 Puffs Two Times A Day 8)  Vitamin D (Ergocalciferol) 50000 Unit Caps (Ergocalciferol) .Marland Kitchen.. 1 Capsule Every Tuesday and Friday 9)  Simvastatin 40 Mg Tabs (Simvastatin) .... Take 1 Tab By Mouth At Bedtime 10)  Citrucel 500 Mg Tabs (Methylcellulose (Laxative)) .... Once Daily 11)  Proair Hfa 108 (90 Base) Mcg/act  Aers (Albuterol Sulfate) .... 2 Puffs Every 4-6 Hours As Needed 12)  Mucinex Dm 30-600 Mg  Tb12 (Dextromethorphan-Guaifenesin) .Marland Kitchen.. 1-2 Every 12 Hours As Needed 13)  Stool Softener 100 Mg Caps (Docusate Sodium) .... Take 1 Capsule By Mouth At Bedtime 14)  Miralax  Powd (Polyethylene Glycol 3350) .Marland Kitchen.. 1 Capful At Night As Needed 15)  Gas-X Extra Strength 125 Mg Caps (Simethicone) .... Take With Meals  Allergies (verified): 1)  Asa 2)  Tylox 3)  Sulfa 4)  * Meds With Red Dye  Past History:  Past Medical History: ASTHMA (ICD-493.90)    - HFA  25% June 07, 2009  > 75% April 08, 2010      -overnight ox 10/21 2% O2 <88%, cont on on nocturnal O2.  GERD  clinical dx OBESITY Meds reviewed with pt education and computerized med calendar completed/adjusted. redone June 21, 2009 Flu shot June 21, 2009  Diabetes mellitus, type II Hypertension Noncritical  coronary artery disease by cardiac catheterization in 1999, with branch vessel coronary artery disease affecting the first obtuse marginal branch.  Vital Signs:  Patient profile:   75 year old female Weight:      218.25 pounds O2 Sat:      97 % on Room air Temp:     98.2 degrees F oral Pulse rate:   85 / minute BP sitting:   122 / 62  (left arm)  Vitals Entered By: Vernie Murders (April 07, 2010 10:08 AM)  O2 Flow:  Room air  Physical Exam  Additional Exam:  wt 222 April 22, 2009  > 224 June 07, 2009 > 218 April 08, 2010  amb hoarse bf nad  pseudowheeze resolves with purse lip maneuver  HEENT: nl dentition, turbinates, and orophanx. Nl external ear canals without cough reflex NECK :  without JVD/Nodes/TM/ nl carotid upstrokes bilaterally LUNGS: no acc muscle use, clear to A and P bilaterally without cough on insp or exp maneuvers CV:  RRR  no s3 or murmur or increase in P2, no edema   ABD:  soft and nontender with nl excursion in the supine position. No bruits or organomegaly, bowel sounds nl, no guarding or rebound.  MS:  warm without deformities, calf tenderness, cyanosis or clubbing     Impression & Recommendations:  Problem # 1:  ASTHMA (ICD-493.90) DDX of  difficult airways managment all start with A and  include Adherence, Ace Inhibitors, Acid Reflux, Active Sinus Disease, Alpha 1 Antitripsin deficiency, Anxiety masquerading as Airways dz,  ABPA,  allergy(esp in young), Aspiration (esp in elderly), Adverse effects of DPI,  Active smokers, plus one B  = Beta blocker use..   Acid reflux has always played a major role in her symptoms and typically once this is addressed she is no longer "difficult to control"  Adherence clearly the greatest concern. I spent extra time with the patient today explaining optimal mdi  technique.  This improved from  50-75% Each maintenance medication was reviewed in detail including most importantly the difference between maintenance and as  needed and under what circumstances the prns are to be used.  To keep things simple, I have asked the patient to first separate medicines that are perceived as maintenance, that is to be taken daily "no matter what", from those medicines that are taken on only on an as-needed basis and I have given the patient examples of both, and then return to see our NP to generate a yet another  medication calendar which should be followed until the next physician sees the patient and updates it.   Once we're sure that we're all reading from the same page in terms of medication  admiistration, she needs to be scheduled to follow up with me with pft's.  Medications Added to Medication List This Visit: 1)  Humalog Mix 75/25 75-25 % Susp (Insulin lispro prot & lispro) .... 24 units at bedtime 2)  Aciphex 20 Mg Tbec (Rabeprazole sodium) .... Take  one 30-60 min before first meal of the day 3)  Pepcid Ac Maximum Strength 20 Mg Tabs (Famotidine) .... One at bedtime 4)  Prednisone 10 Mg Tabs (Prednisone) .... 4 each am x 2days, 2x2days, 1x2days and stop  Other Orders: Est. Patient Level IV (14431) HFA Instruction [phone])  Patient Instructions: 1)  Prednisone 4 each am x 2days, 2x2days, 1x2days and stop 2)  Aciphex 20 mg Take  one 30-60 min before first meal of the day and pepcid 20 mg at bedtime 3)  GERD (REFLUX)  is a common cause of respiratory symptoms. It commonly presents without heartburn and can be treated with medication, but also with lifestyle changes including avoidance of late meals, excessive alcohol, smoking cessation, and avoid fatty foods, chocolate, peppermint, colas, red wine, and acidic juices such as orange juice. NO MINT OR MENTHOL PRODUCTS SO NO COUGH DROPS  4)  USE SUGARLESS CANDY INSTEAD (jolley ranchers)  5)  NO OIL BASED VITAMINS  6)  Work on inhaler technique:  relax and blow all the way out then take a nice smooth deep breath back in, triggering the inhaler at same time you start breathing  in and hold a few seconds 7)  See Tammy NP w/in 2 weeks with all your medications, even over the counter meds, separated in two separate bags, the ones you take no matter what vs the ones you stop once you feel better and take only as needed.  She will generate for you a new user friendly medication calendar that will put [place] all on the same page re: your medication use.  Prescriptions: PREDNISONE 10 MG  TABS (PREDNISONE) 4 each am x 2days, 2x2days, 1x2days and stop  #14 x 0   Entered and Authorized by:   Nyoka Cowden MD   Signed by:   Nyoka Cowden MD on 04/07/2010   Method used:   [facility]        [facility]  [address] [phone][facility] (retail)       [address]       [place], [place]  [id number]       Ph: [phone] or [phone]       Fax: [phone]   RxID:   [id number]

## 2010-09-26 NOTE — Assessment & Plan Note (Signed)
Summary: NP follow up - med calendar   Primary Provider/Referring Provider:  Dorothe Pea  CC:  est med calendar - pt brought all meds with her today.  c/o poss thrush from symbicort and sttaes rinses her mouth "most of the time.".  History of Present Illness: 3 yobf never smoker with  known history of  " Difficult t-to-control asthma," dating all the way back to the late 1990's typically flares with non-adherence with GERD rx  April 22, 2009 ov not seen > year doing well on symbicort as needed maybe once daily but once the weather got hot (x sev months)  started using  symbicort 80 4 puffs in 24 hours and just one albuterol. dyspnea and cough more daytime than night but sometimes it  wakes her  up at night recently also, no excess mucus.  rec Work on inhaler technique:  relax and blow all the way out then take a nice smooth deep breath back in, triggering the inhaler at same time you start breathing in  symbicort 80 2 puffs first thing  in am and 2 puffs again in pm about 12 hours later if needed (the second dose is optional) prevacid 15 mg Take one 30-60 min before first and last meals of the day automatically   June 21, 2009--Returns for follow up and med review. Last visit recommended to add citrucel to regimen however she forgot to go get. Finances play a big role into her meds, she can get generics rx for $5. She was started on prevacid but it is not working. Continues to have upper abd fullness, bloating, and constipation. Her meds were reviewed and pt education was given. no sob or cough  April 07, 2010  Acute visit. Pt c/o increased SOB since the Spring 2011- states that this has been worse x 1 wk with heavy feeling in chest. She gets SOB walking from one room to the next.  Heat makes breathing worse.  She also c/o dry cough- mainly at bedtime but not waking up once asleep or early in am with resp c/o's . --tx w/ steroid taper.   PAGE 2>>>>>>>>>April 21, 2010 --Presents for follow up  and med review. We reviewed his meds and updated calendar.  .  states SOB has improved since last OV, having prod cough at bedtime.Marland Kitchen Has some intermittent abdominal discomfort w/ bloating and gas. Last visit w/ asthmatic flare tx w/ steroids, she is improving w/ decreased dyspnea/cough but not back to baseline yet.   June 02, 2010 Acute visit.  Pt c/o chest tightness and cough x 1 wk- cough is prod with yellow sputum.  Has had chills but no fever.  She wheezes when lies down and sometimes with exertion.  all this occurred when ran out of ppi assoc with sensation of pnds but no noct awakening or perceived increase need for saba.   >>tx steroid taper for exacerbation. and pepcid increased.   July 05, 2010 --Presents for a follow up and med review. She is feeling much better from asthma flare last ov. She has finished her steroid taper. No overt reflux since increasing pepcid two times a day , no longer on PPI. Rare use of Proair since last ov.  Her rx coverage is okay , she has not filled rx for diovan or symbicort we sent rx to pharm today to make sure she can afford copay.  We reviewed all her meds and organized her med calendar. She uses her calendar and takes meds  from bottle each day. Does not use pill box. Denies chest pain, orthopnea, hemoptysis, fever, n/v/d, edema, headache.      Medications Prior to Update: 1)  Metformin Hcl 500 Mg  Tabs (Metformin Hcl) .... Take 1 Tablet By Mouth Two Times A Day 2)  Calcium-Vitamin D 500-200 Mg-Unit Tabs (Calcium Carbonate-Vitamin D) .... Take One By Mouth Two Times A Day 3)  Hydrochlorothiazide 25 Mg  Tabs (Hydrochlorothiazide) .... Take 1 Tablet By Mouth Once A Day 4)  Diovan 160 Mg Tabs (Valsartan) .... Take One By Mouth Once Daily 5)  Oxygen 2 Liters .... At Bedtime 6)  Humalog Mix 75/25 75-25 % Susp (Insulin Lispro Prot & Lispro) .... 24 Units At Bedtime 7)  Clonazepam 2 Mg Tabs (Clonazepam) .... Take One By Mouth Two Times A Day 8)  Symbicort  80-4.5 Mcg/act  Aero (Budesonide-Formoterol Fumarate) .... Inhale 2 Puffs Two Times A Day 9)  Vitamin D (Ergocalciferol) 50000 Unit Caps (Ergocalciferol) .Marland Kitchen.. 1 Capsule Every Tuesday and Friday 10)  Simvastatin 40 Mg Tabs (Simvastatin) .... Take 1 Tab By Mouth At Bedtime 11)  Citrucel 500 Mg Tabs (Methylcellulose (Laxative)) .... Once Daily 12)  Pepcid Ac Maximum Strength 20 Mg Tabs (Famotidine) .... One After Breakfast and One At Bedtime 13)  Probiotic  Caps (Probiotic Product) .... Take One By Mouth Once Daily 14)  Proair Hfa 108 (90 Base) Mcg/act  Aers (Albuterol Sulfate) .... 2 Puffs Every 4-6 Hours As Needed 15)  Mucinex Dm 30-600 Mg  Tb12 (Dextromethorphan-Guaifenesin) .Marland Kitchen.. 1-2 Every 12 Hours As Needed 16)  Tramadol Hcl 50 Mg Tabs (Tramadol Hcl) .Marland Kitchen.. 1 - 2 Every 4 Hours For Cough 17)  Stool Softener 100 Mg Caps (Docusate Sodium) .... Take 1 Capsule By Mouth At Bedtime 18)  Miralax  Powd (Polyethylene Glycol 3350) .Marland Kitchen.. 1 Capful At Night As Needed 19)  Gas-X Extra Strength 125 Mg Caps (Simethicone) .... Take With Meals 20)  Fluticasone Propionate 50 Mcg/act Susp (Fluticasone Propionate) .... 2 Sprays Each Nostril Once Daily 21)  Prednisone 10 Mg  Tabs (Prednisone) .... 4 Each Am X 2days, 2x2days, 1x2days and Stop  Allergies (verified): 1)  Asa 2)  Tylox 3)  Sulfa 4)  * Meds With Red Dye  Past History:  Past Medical History: Last updated: 06/02/2010 ASTHMA (ICD-493.90)    - HFA  25% June 07, 2009  > 75% April 08, 2010 > 90% June 02, 2010      -overnight ox 10/21 2% O2 <88%, cont on on nocturnal O2 GERD  clinical dx OBESITY Meds reviewed with pt education and computerized med calendar completed/adjusted. redone June 21, 2009  Diabetes mellitus, type II Hypertension Noncritical coronary artery disease by cardiac catheterization in 1999, with branch vessel coronary artery disease affecting the first obtuse marginal branch.  Past Surgical History: Last updated:  06/22/2009 Cholecystectomy Right rotator cuff repair in 2003 Appendectomy Hysterectomy Lumbar laminectomy in 2002.  She reports chronic left leg numbness and tingling since then.  Family History: Last updated: 06/22/2009 Family history of asthma, heart diease, clotting diorders, and cancer Mother deceased at age 68 due to cancer (unsure what type) Father deceased at age 53 due to heart disease Sister had breast cancer in her 30s.  Social History: Last updated: 06/22/2009 Patient is retired Married with 2 children Never smoked Diabetic  Risk Factors: Smoking Status: never (09/14/2009)  Review of Systems      See HPI  Vital Signs:  Patient profile:   75 year old female  Height:      67 inches Weight:      222.25 pounds BMI:     34.94 O2 Sat:      97 % on Room air Temp:     98.9 degrees F oral Pulse rate:   71 / minute BP sitting:   138 / 78  (left arm) Cuff size:   large  Vitals Entered By: Boone Master CNA/MA (July 05, 2010 9:14 AM)  O2 Flow:  Room air CC: est med calendar - pt brought all meds with her today.  c/o poss thrush from symbicort, sttaes rinses her mouth "most of the time." Is Patient Diabetic? Yes Comments Medications reviewed with patient Daytime contact number verified with patient. Boone Master CNA/MA  July 05, 2010 9:14 AM    Physical Exam  Additional Exam:  wt 222 April 22, 2009  > 224 June 07, 2009 > 218 April 08, 2010 >>221 April 21, 2010 > 224 June 02, 2010 >>222 July 05, 2010  amb hoarse bf nad  HEENT: nl dentition, turbinates, and orophanx. Nl external ear canals without cough reflex NECK :  without JVD/Nodes/TM/ nl carotid upstrokes bilaterally LUNGS: no acc muscle use, clear to A and P bilaterally without cough on insp or exp maneuvers CV:  RRR  no s3 or murmur or increase in P2, no edema   ABD:  soft and nontender with nl excursion in the supine position. No bruits or organomegaly, bowel sounds nl, no guarding or  rebound.  MS:  warm without deformities, calf tenderness, cyanosis or clubbing     Impression & Recommendations:  Problem # 1:  ASTHMA (ICD-493.90) Recent flare now resolved  Meds reviewed with pt education and computerized med calendar adjusted.   Problem # 2:  GERD (ICD-530.81)  controlled on rx  Her updated medication list for this problem includes:    Pepcid Ac Maximum Strength 20 Mg Tabs (Famotidine) ..... One after breakfast and one at bedtime  Orders: Est. Patient Level III (42706)  Patient Instructions: 1)  Continue on same meds.  2)  Follow med calendar closely and bring to each visit.  3)  follow up Dr. Sherene Sires in 3 months and as needed  Prescriptions: SYMBICORT 80-4.5 MCG/ACT  AERO (BUDESONIDE-FORMOTEROL FUMARATE) Inhale 2 puffs two times a day  #1 x 5   Entered and Authorized by:   Rubye Oaks NP   Signed by:   Rubye Oaks NP on 07/05/2010   Method used:   [facility] [phone][facility] (retail)       [address]       [place], [place]  [address]       Ph: [phone] or [phone]       Fax: [phone]   RxID:   [id number] DIOVAN 160 MG TABS (VALSARTAN) Take one by mouth once daily  #30 x 5   Entered and Authorized by:   Rubye Oaks NP   Signed by:   Rubye Oaks NP on 07/05/2010   Method used:   [facility]  [address] [phone]* (retail)       [address]       [place], [place]  [id number]       Ph: [phone] or [phone]  Fax: [phone]   RxID:   [id number]    Immunization History:  Influenza Immunization History:    Influenza:  historical (05/27/2010)   Appended Document: med calendar update    Clinical Lists Changes  Medications: Changed medication from HYDROCHLOROTHIAZIDE 25 MG  TABS (HYDROCHLOROTHIAZIDE) Take 1 tablet by mouth once a day to HYDROCHLOROTHIAZIDE 50 MG TABS (HYDROCHLOROTHIAZIDE) 1/2 tab by  mouth once daily Changed medication from CLONAZEPAM 2 MG TABS (CLONAZEPAM) Take one by mouth two times a day to CLONAZEPAM 2 MG TABS (CLONAZEPAM) Take one by mouth  every morning and at bedtime Changed medication from FLUTICASONE PROPIONATE 50 MCG/ACT SUSP (FLUTICASONE PROPIONATE) 2 sprays each nostril once daily to FLUTICASONE PROPIONATE 50 MCG/ACT SUSP (FLUTICASONE PROPIONATE) 1 puff each nostril  every morning and at bedtime Changed medication from PROAIR HFA 108 (90 BASE) MCG/ACT  AERS (ALBUTEROL SULFATE) 2 puffs every 4-6 hours as needed to PROAIR HFA 108 (90 BASE) MCG/ACT  AERS (ALBUTEROL SULFATE) 1-2 puffs every 4-6 hours as needed Changed medication from STOOL SOFTENER 100 MG CAPS (DOCUSATE SODIUM) Take 1 capsule by mouth at bedtime to STOOL SOFTENER 100 MG CAPS (DOCUSATE SODIUM) 1-2 capsules by mouth at bedtime as needed Changed medication from MIRALAX  POWD (POLYETHYLENE GLYCOL 3350) 1 capful at night as needed to MIRALAX  POWD (POLYETHYLENE GLYCOL 3350) 1 capful two times a day as needed Removed medication of PREDNISONE 10 MG  TABS (PREDNISONE) 4 each am x 2days, 2x2days, 1x2days and stop

## 2010-09-26 NOTE — Consult Note (Signed)
Summary: Ophthalmology  Ophthalmology   Imported By: Florinda Marker 09/01/2009 08:43:53  _____________________________________________________________________  External Attachment:    Type:   Image     Comment:   External Document  Appended Document: Ophthalmology   Diabetic Eye Exam  Procedure date:  10/11/2008  Findings:      Exam by Dr. Elmer Picker (see report)  Procedures Next Due Date:    Diabetic Eye Exam: 02/2009

## 2010-09-26 NOTE — Assessment & Plan Note (Signed)
Summary: NP follow up - med calendar   Primary Provider/Referring Provider:  Dorothe Pea  CC:  follow up and med review. .  History of Present Illness: 89 yobf never smoker with  known history of  " Difficult t-to-control asthma," dating all the way back to the late 1990's typically flares with non-adherence with GERD rx  April 22, 2009 ov not seen > year doing well on symbicort as needed maybe once daily but once the weather got hot (x sev months)  started using  symbicort 80 4 puffs in 24 hours and just one albuterol. dyspnea and cough more daytime than night but sometimes it  wakes her  up at night recently also, no excess mucus.  rec Work on inhaler technique:  relax and blow all the way out then take a nice smooth deep breath back in, triggering the inhaler at same time you start breathing in  symbicort 80 2 puffs first thing  in am and 2 puffs again in pm about 12 hours later if needed (the second dose is optional) prevacid 15 mg Take one 30-60 min before first and last meals of the day automatically   June 21, 2009--Returns for follow up and med review. Last visit recommended to add citrucel to regimen however she forgot to go get. Finances play a big role into her meds, she can get generics rx for $5. She was started on prevacid but it is not working. Continues to have upper abd fullness, bloating, and constipation. Her meds were reviewed and pt education was given. no sob or cough  April 07, 2010  Acute visit. Pt c/o increased SOB since the Spring 2011- states that this has been worse x 1 wk with heavy feeling in chest. She gets SOB walking from one room to the next.  Heat makes breathing worse.  She also c/o dry cough- mainly at bedtime but not waking up once asleep or early in am with resp c/o's . --tx w/ steroid taper.    April 21, 2010 --Presents for follow up and med review. We reviewed his meds and updated calendar.  .  states SOB has improved since last OV, having prod cough at  bedtime.Marland Kitchen Has some intermittent abdominal discomfort w/ bloating and gas. Last visit w/ asthmatic flare tx w/ steroids, she is improving w/ decreased dyspnea/cough but not back to baseline yet. Denies chest pain, orthopnea, hemoptysis, fever, n/v/d, edema, headache.   Medications Prior to Update: 1)  Metformin Hcl 500 Mg  Tabs (Metformin Hcl) .... Take 1 Tablet By Mouth Two Times A Day 2)  Hydrochlorothiazide 25 Mg  Tabs (Hydrochlorothiazide) .... Take 1 Tablet By Mouth Once A Day 3)  Diovan 320 Mg Tabs (Valsartan) .... Take 1/2 Tablet By Mouth Once A Day 4)  Oxygen 2 Liters .... At Bedtime 5)  Humalog Mix 75/25 75-25 % Susp (Insulin Lispro Prot & Lispro) .... 24 Units At Bedtime 6)  Clonazepam 1 Mg  Tabs (Clonazepam) .... Take 1 Tablet By Mouth Two Times A Day 7)  Symbicort 80-4.5 Mcg/act  Aero (Budesonide-Formoterol Fumarate) .... Inhale 2 Puffs Two Times A Day 8)  Aciphex 20 Mg  Tbec (Rabeprazole Sodium) .... Take  One 30-60 Min Before First Meal of The Day 9)  Vitamin D (Ergocalciferol) 50000 Unit Caps (Ergocalciferol) .Marland Kitchen.. 1 Capsule Every Tuesday and Friday 10)  Simvastatin 40 Mg Tabs (Simvastatin) .... Take 1 Tab By Mouth At Bedtime 11)  Citrucel 500 Mg Tabs (Methylcellulose (Laxative)) .... Once Daily 12)  Pepcid Ac Maximum Strength 20 Mg Tabs (Famotidine) .... One At Bedtime 13)  Proair Hfa 108 (90 Base) Mcg/act  Aers (Albuterol Sulfate) .... 2 Puffs Every 4-6 Hours As Needed 14)  Mucinex Dm 30-600 Mg  Tb12 (Dextromethorphan-Guaifenesin) .Marland Kitchen.. 1-2 Every 12 Hours As Needed 15)  Stool Softener 100 Mg Caps (Docusate Sodium) .... Take 1 Capsule By Mouth At Bedtime 16)  Miralax  Powd (Polyethylene Glycol 3350) .Marland Kitchen.. 1 Capful At Night As Needed 17)  Gas-X Extra Strength 125 Mg Caps (Simethicone) .... Take With Meals 18)  Prednisone 10 Mg  Tabs (Prednisone) .... 4 Each Am X 2days, 2x2days, 1x2days and Stop  Current Medications (verified): 1)  Metformin Hcl 500 Mg  Tabs (Metformin Hcl) .... Take  1 Tablet By Mouth Two Times A Day 2)  Calcium-Vitamin D 500-200 Mg-Unit Tabs (Calcium Carbonate-Vitamin D) .... Take One By Mouth Two Times A Day 3)  Hydrochlorothiazide 25 Mg  Tabs (Hydrochlorothiazide) .... Take 1 Tablet By Mouth Once A Day 4)  Diovan 160 Mg Tabs (Valsartan) .... Take One By Mouth Once Daily 5)  Oxygen 2 Liters .... At Bedtime 6)  Humalog Mix 75/25 75-25 % Susp (Insulin Lispro Prot & Lispro) .... 24 Units At Bedtime 7)  Clonazepam 2 Mg Tabs (Clonazepam) .... Take One By Mouth Two Times A Day 8)  Symbicort 80-4.5 Mcg/act  Aero (Budesonide-Formoterol Fumarate) .... Inhale 2 Puffs Two Times A Day 9)  Aciphex 20 Mg  Tbec (Rabeprazole Sodium) .... Take  One 30-60 Min Before First Meal of The Day 10)  Vitamin D (Ergocalciferol) 50000 Unit Caps (Ergocalciferol) .Marland Kitchen.. 1 Capsule Every Tuesday and Friday 11)  Simvastatin 40 Mg Tabs (Simvastatin) .... Take 1 Tab By Mouth At Bedtime 12)  Citrucel 500 Mg Tabs (Methylcellulose (Laxative)) .... Once Daily 13)  Pepcid Ac Maximum Strength 20 Mg Tabs (Famotidine) .... One At Bedtime 14)  Probiotic  Caps (Probiotic Product) .... Take One By Mouth Once Daily 15)  Proair Hfa 108 (90 Base) Mcg/act  Aers (Albuterol Sulfate) .... 2 Puffs Every 4-6 Hours As Needed 16)  Mucinex Dm 30-600 Mg  Tb12 (Dextromethorphan-Guaifenesin) .Marland Kitchen.. 1-2 Every 12 Hours As Needed 17)  Tramadol Hcl 50 Mg Tabs (Tramadol Hcl) .... Take One Every 4 Hrs As Needed 18)  Stool Softener 100 Mg Caps (Docusate Sodium) .... Take 1 Capsule By Mouth At Bedtime 19)  Miralax  Powd (Polyethylene Glycol 3350) .Marland Kitchen.. 1 Capful At Night As Needed 20)  Gas-X Extra Strength 125 Mg Caps (Simethicone) .... Take With Meals  Allergies (verified): 1)  Asa 2)  Tylox 3)  Sulfa 4)  * Meds With Red Dye  Past History:  Past Medical History: Last updated: 04/07/2010 ASTHMA (ICD-493.90)    - HFA  25% June 07, 2009  > 75% April 08, 2010      -overnight ox 10/21 2% O2 <88%, cont on on nocturnal  O2.  GERD  clinical dx OBESITY Meds reviewed with pt education and computerized med calendar completed/adjusted. redone June 21, 2009 Flu shot June 21, 2009  Diabetes mellitus, type II Hypertension Noncritical coronary artery disease by cardiac catheterization in 1999, with branch vessel coronary artery disease affecting the first obtuse marginal branch.  Past Surgical History: Last updated: 06/22/2009 Cholecystectomy Right rotator cuff repair in 2003 Appendectomy Hysterectomy Lumbar laminectomy in 2002.  She reports chronic left leg numbness and tingling since then.  Family History: Last updated: 06/22/2009 Family history of asthma, heart diease, clotting diorders, and cancer Mother deceased at  age 8 due to cancer (unsure what type) Father deceased at age 29 due to heart disease Sister had breast cancer in her 75s.  Social History: Last updated: 06/22/2009 Patient is retired Married with 2 children Never smoked Diabetic  Risk Factors: Alcohol Use: 0 (09/14/2009)  Risk Factors: Smoking Status: never (09/14/2009)  Review of Systems      See HPI  Vital Signs:  Patient profile:   75 year old female Height:      67 inches Weight:      221 pounds BMI:     34.74 O2 Sat:      97 % on Room air Temp:     98.2 degrees F oral Pulse rate:   87 / minute BP sitting:   132 / 72  (left arm) Cuff size:   large  Vitals Entered By: Boone Master CNA/MA (April 21, 2010 10:09 AM)  O2 Flow:  Room air CC: follow up and med review.  Is Patient Diabetic? Yes Comments Medications reviewed with patient Daytime contact number verified with patient. Boone Master CNA/MA  April 21, 2010 10:09 AM    Physical Exam  Additional Exam:  wt 222 April 22, 2009  > 224 June 07, 2009 > 218 April 08, 2010 >>221 April 21, 2010 amb hoarse bf nad  pseudowheeze resolves with purse lip maneuver  HEENT: nl dentition, turbinates, and orophanx. Nl external ear canals without cough  reflex NECK :  without JVD/Nodes/TM/ nl carotid upstrokes bilaterally LUNGS: no acc muscle use, clear to A and P bilaterally without cough on insp or exp maneuvers CV:  RRR  no s3 or murmur or increase in P2, no edema   ABD:  soft and nontender with nl excursion in the supine position. No bruits or organomegaly, bowel sounds nl, no guarding or rebound.  MS:  warm without deformities, calf tenderness, cyanosis or clubbing     Impression & Recommendations:  Problem # 1:  ASTHMA (ICD-493.90) Slow to resolve flare.  Plan:  Increase Symbicort 160/4.56mcg 2 puffs two times a day for 2 weeks--I have given you a sample --then return to 80/4.42mcg --brush/rinse/gargle after use.  Please contact office for sooner follow up if symptoms do not improve or worsen   Problem # 2:  GERD (ICD-530.81)  advised on gerd diet.  Plan  Eat 6 small meals a day. lots of fluids.  Add Probiotic, (Align, Acidophyllis, etc) once daily  Follow med calendar closley and bring to each visit.  Please contact office for sooner follow up if symptoms do not improve or worsen  follow up 3 months Dr. Sherene Sires  Her updated medication list for this problem includes:    Aciphex 20 Mg Tbec (Rabeprazole sodium) .Marland Kitchen... Take  one 30-60 min before first meal of the day    Pepcid Ac Maximum Strength 20 Mg Tabs (Famotidine) ..... One at bedtime  Orders: Est. Patient Level III (16109)  Medications Added to Medication List This Visit: 1)  Calcium-vitamin D 500-200 Mg-unit Tabs (Calcium carbonate-vitamin d) .... Take one by mouth two times a day 2)  Diovan 160 Mg Tabs (Valsartan) .... Take one by mouth once daily 3)  Clonazepam 2 Mg Tabs (Clonazepam) .... Take one by mouth two times a day 4)  Probiotic Caps (Probiotic product) .... Take one by mouth once daily 5)  Tramadol Hcl 50 Mg Tabs (Tramadol hcl) .... Take one every 4 hrs as needed  Complete Medication List: 1)  Metformin Hcl 500 Mg Tabs (Metformin hcl) .Marland KitchenMarland KitchenMarland Kitchen  Take 1 tablet by  mouth two times a day 2)  Calcium-vitamin D 500-200 Mg-unit Tabs (Calcium carbonate-vitamin d) .... Take one by mouth two times a day 3)  Hydrochlorothiazide 25 Mg Tabs (Hydrochlorothiazide) .... Take 1 tablet by mouth once a day 4)  Diovan 160 Mg Tabs (Valsartan) .... Take one by mouth once daily 5)  Oxygen 2 Liters  .... At bedtime 6)  Humalog Mix 75/25 75-25 % Susp (Insulin lispro prot & lispro) .... 24 units at bedtime 7)  Clonazepam 2 Mg Tabs (Clonazepam) .... Take one by mouth two times a day 8)  Symbicort 80-4.5 Mcg/act Aero (Budesonide-formoterol fumarate) .... Inhale 2 puffs two times a day 9)  Aciphex 20 Mg Tbec (Rabeprazole sodium) .... Take  one 30-60 min before first meal of the day 10)  Vitamin D (ergocalciferol) 50000 Unit Caps (Ergocalciferol) .Marland Kitchen.. 1 capsule every tuesday and friday 11)  Simvastatin 40 Mg Tabs (Simvastatin) .... Take 1 tab by mouth at bedtime 12)  Citrucel 500 Mg Tabs (Methylcellulose (laxative)) .... Once daily 13)  Pepcid Ac Maximum Strength 20 Mg Tabs (Famotidine) .... One at bedtime 14)  Probiotic Caps (Probiotic product) .... Take one by mouth once daily 15)  Proair Hfa 108 (90 Base) Mcg/act Aers (Albuterol sulfate) .... 2 puffs every 4-6 hours as needed 16)  Mucinex Dm 30-600 Mg Tb12 (Dextromethorphan-guaifenesin) .Marland Kitchen.. 1-2 every 12 hours as needed 17)  Tramadol Hcl 50 Mg Tabs (Tramadol hcl) .... Take one every 4 hrs as needed 18)  Stool Softener 100 Mg Caps (Docusate sodium) .... Take 1 capsule by mouth at bedtime 19)  Miralax Powd (Polyethylene glycol 3350) .Marland Kitchen.. 1 capful at night as needed 20)  Gas-x Extra Strength 125 Mg Caps (Simethicone) .... Take with meals  Patient Instructions: 1)  Increase Symbicort 160/4.29mcg 2 puffs two times a day for 2 weeks--I have given you a sample --then return to 80/4.67mcg --brush/rinse/gargle after use.  2)  Eat 6 small meals a day. lots of fluids.  3)  Add Probiotic, (Align, Acidophyllis, etc) once daily  4)  Follow  med calendar closley and bring to each visit.  5)  Please contact office for sooner follow up if symptoms do not improve or worsen  6)  follow up 3 months Dr. Sherene Sires

## 2010-09-26 NOTE — Letter (Signed)
Summary: Pharmacologist   Imported By: Florinda Marker 09/15/2009 15:47:16  _____________________________________________________________________  External Attachment:    Type:   Image     Comment:   External Document

## 2010-09-28 NOTE — Assessment & Plan Note (Signed)
Summary: Pulmonary/ ext ov with hfa 90%   Primary Provider/Referring Provider:  [place]  CC:  Increased dyspnea x 3 wks.  History of Present Illness: 85 yobf never smoker with  known history of  " Difficult t-to-control asthma," dating all the way back to the late 1990's typically flares with non-adherence with GERD rx but remains ICS dependent.  April 22, 2009 ov not seen > year doing well on symbicort as needed maybe once daily but once the weather got hot (x sev months)  started using  symbicort 80 4 puffs in 24 hours and just one albuterol. dyspnea and cough more daytime than night but sometimes it  wakes her  up at night recently also, no excess mucus.  rec Work on inhaler technique:  relax and blow all the way out then take a nice smooth deep breath back in, triggering the inhaler at same time you start breathing in  symbicort 80 2 puffs first thing  in am and 2 puffs again in pm about 12 hours later if needed (the second dose is optional) prevacid 15 mg Take one 30-60 min before first and last meals of the day automatically   June 21, 2009--Returns for follow up and med review. Last visit recommended to add citrucel to regimen however she forgot to go get. Finances play a big role into her meds, she can get generics rx for $5. She was started on prevacid but it is not working. Continues to have upper abd fullness, bloating, and constipation. Her meds were reviewed and pt education was given. no sob or cough  April 07, 2010  Acute visit. Pt c/o increased SOB since the Spring 2011- states that this has been worse x 1 wk with heavy feeling in chest. She gets SOB walking from one room to the next.  Heat makes breathing worse.  She also c/o dry cough- mainly at bedtime but not waking up once asleep or early in am with resp c/o's . --tx w/ steroid taper.   PAGE 2>>>>>>>>> June 02, 2010 Acute visit.  Pt c/o chest tightness and cough x 1 wk- cough is prod with yellow sputum.  Has had chills  but no fever.  She wheezes when lies down and sometimes with exertion.  all this occurred when ran out of ppi assoc with sensation of pnds but no noct awakening or perceived increase need for saba.   >>tx steroid taper for exacerbation. and pepcid increased.   July 05, 2010 --Presents for a follow up and med review. She is feeling much better from asthma flare last ov. She has finished her steroid taper. No overt reflux since increasing pepcid two times a day , no longer on PPI. Rare use of Proair since last ov.  Her rx coverage is okay , she has not filled rx for diovan or symbicort we sent rx to pharm today to make sure she can afford copay. rec no change in rx  September 20, 2010 ov much worse since ran out of symbicort, didn't know we called it into pharmacy, thinks xopenex twice daily was a reasonable substitute and didn't realize it was already entered on the med cal as a as needed to take in place of proaire, both as needed. Has increase sob, cough and need for saba x several weeks, no purulent sputum. Pt denies any significant sore throat, dysphagia, itching, sneezing,  nasal congestion or excess secretions,  fever, chills, sweats, unintended wt loss, pleuritic or exertional cp, hempoptysis,  change in activity tolerance  orthopnea pnd or leg swelling Pt also denies any obvious fluctuation in symptoms with weather or environmental change or other alleviating or aggravating factors.          Current Medications (verified): 1)  Metformin Hcl 500 Mg  Tabs (Metformin Hcl) .... Take 1 Tablet By Mouth Two Times A Day 2)  Calcium-Vitamin D 500-200 Mg-Unit Tabs (Calcium Carbonate-Vitamin D) .... Take One By Mouth Two Times A Day 3)  Hydrochlorothiazide 50 Mg Tabs (Hydrochlorothiazide) .... 1/2 Tab By Mouth Once Daily 4)  Diovan 160 Mg Tabs (Valsartan) .... Take One By Mouth Once Daily 5)  Oxygen 2 Liters .... At Bedtime 6)  Humalog Mix 75/25 75-25 % Susp (Insulin Lispro Prot & Lispro) .... 24 Units  At Bedtime 7)  Clonazepam 2 Mg Tabs (Clonazepam) .... Take One By Mouth  Every Morning and At Bedtime 8)  Symbicort 80-4.5 Mcg/act  Aero (Budesonide-Formoterol Fumarate) .... Inhale 2 Puffs Two Times A Day 9)  Fluticasone Propionate 50 Mcg/act Susp (Fluticasone Propionate) .Marland Kitchen.. 1 Puff Each Nostril  Every Morning and At Bedtime 10)  Vitamin D (Ergocalciferol) 50000 Unit Caps (Ergocalciferol) .Marland Kitchen.. 1 Capsule Every Tuesday and Friday 11)  Simvastatin 40 Mg Tabs (Simvastatin) .... Take 1 Tab By Mouth At Bedtime 12)  Citrucel 500 Mg Tabs (Methylcellulose (Laxative)) .... Once Daily 13)  Pepcid Ac Maximum Strength 20 Mg Tabs (Famotidine) .... One After Breakfast and One At Bedtime 14)  Probiotic  Caps (Probiotic Product) .... Take One By Mouth Once Daily 15)  Proair Hfa 108 (90 Base) Mcg/act  Aers (Albuterol Sulfate) .Marland Kitchen.. 1-2 Puffs Every 4-6 Hours As Needed 16)  Mucinex Dm 30-600 Mg  Tb12 (Dextromethorphan-Guaifenesin) .Marland Kitchen.. 1-2 Every 12 Hours As Needed 17)  Tramadol Hcl 50 Mg Tabs (Tramadol Hcl) .Marland Kitchen.. 1 - 2 Every 4 Hours For Cough 18)  Stool Softener 100 Mg Caps (Docusate Sodium) .Marland Kitchen.. 1-2 Capsules By Mouth At Bedtime As Needed 19)  Miralax  Powd (Polyethylene Glycol 3350) .Marland Kitchen.. 1 Capful Two Times A Day As Needed 20)  Gas-X Extra Strength 125 Mg Caps (Simethicone) .... Take With Meals  Allergies (verified): 1)  Asa 2)  Tylox 3)  Sulfa 4)  * Meds With Red Dye  Past History:  Past Medical History: ASTHMA (ICD-493.90)    - HFA  25% June 07, 2009  > 75% April 08, 2010 > 90% June 02, 2010 > 90% September 19, 2010      -overnight ox 10/21 2% O2 <88%, cont on on nocturnal O2 GERD  clinical dx OBESITY Meds reviewed with pt education and computerized med calendar completed/adjusted. redone June 21, 2009  Diabetes mellitus, type II Hypertension Noncritical coronary artery disease by cardiac catheterization in 1999, with branch vessel coronary artery disease affecting the first obtuse marginal  branch.  Vital Signs:  Patient profile:   75 year old female Weight:      221 pounds O2 Sat:      96 % on Room air Temp:     98.2 degrees F oral Pulse rate:   94 / minute BP sitting:   140 / 78  (left arm)  Vitals Entered By: Vernie Murders (September 19, 2010 2:16 PM)  O2 Flow:  Room air  Physical Exam  Additional Exam:  wt 222 April 22, 2009  > 224 June 07, 2009 > 218 April 08, 2010 >>221 April 21, 2010 > 224 June 02, 2010 >>222 July 05, 2010  > 221  January 245, 2012  amb hoarse anxious  bf nad  HEENT: nl dentition, turbinates, and orophanx. Nl external ear canals without cough reflex NECK :  without JVD/Nodes/TM/ nl carotid upstrokes bilaterally LUNGS: no acc muscle use, clear to A and P bilaterally without cough on insp or exp maneuvers CV:  RRR  no s3 or murmur or increase in P2, no edema   ABD:  soft and nontender with nl excursion in the supine position. No bruits or organomegaly, bowel sounds nl, no guarding or rebound.  MS:  warm without deformities, calf tenderness, cyanosis or clubbing     Impression & Recommendations:  Problem # 1:  ASTHMA (ICD-493.90)  DDX of  difficult airways managment all start with A and  include Adherence, Ace Inhibitors, Acid Reflux, Active Sinus Disease, Alpha 1 Antitripsin deficiency, Anxiety masquerading as Airways dz,  ABPA,  allergy(esp in young), Aspiration (esp in elderly), Adverse effects of DPI,  Active smokers, plus two Bs  = Bronchiectasis and Beta blocker use..and one C= CHF    In this case Adherence is the biggest issue and starts with  inability to use HFA effectively and also  understand that SABA treats the symptoms but doesn't get to the underlying problem (inflammation).  I used  the ananology of putting steroid cream on a rash to help explain the meaning of topical therapy and the need to get the drug to the target tissue.  I spent extra time with the patient today explaining optimal mdi  technique.  This improved from   75-90% with coaching.  ? acid reflux:  rx/ diet reviewed  I had an extended discussion with the patient today lasting 15 to 20 minutes of a 25 minute visit on the following issues:  Each maintenance medication was reviewed in detail including most importantly the difference between maintenance and as needed and under what circumstances the prns are to be used. This was done in the context of a medication calendar review which provided the patient with a user-friendly unambiguous mechanism for medication administration and reconciliation and provides an action plan for all active problems. It is critical that this be shown to every doctor  for modification during the office visit if necessary so the patient can use it as a working document. Explained if the med is listed she'll need to keep up with refills or risk a poor outcome and poor quality to her outpt care when we repeatedly see variations on use of her user friendly med calendar  Medications Added to Medication List This Visit: 1)  Oxygen 2 Liters  .... At bedtime and as needed during the day  Other Orders: Est. Patient Level IV (16109) HFA Instruction [phone])  Patient Instructions: 1)  Please schedule a follow-up appointment in 6 weeks, sooner if needed to See Tammy with all medicines in two bags  2)  Fill the prescription for symbicort 160 and use it exactly as per med calendar

## 2010-09-28 NOTE — Miscellaneous (Signed)
Summary: Order /  Patsy Lager  Order /  [facility]   Imported By: Lennie Odor 09/21/2010 15:23:30  _____________________________________________________________________  External Attachment:    Type:   Image     Comment:   External Document

## 2010-10-03 ENCOUNTER — Ambulatory Visit: Payer: Self-pay | Admitting: Internal Medicine

## 2010-10-31 ENCOUNTER — Encounter (INDEPENDENT_AMBULATORY_CARE_PROVIDER_SITE_OTHER): Payer: Medicare Other | Admitting: Adult Health

## 2010-10-31 ENCOUNTER — Encounter: Payer: Self-pay | Admitting: Adult Health

## 2010-10-31 DIAGNOSIS — J310 Chronic rhinitis: Secondary | ICD-10-CM

## 2010-11-07 NOTE — Assessment & Plan Note (Signed)
Summary: NP follow up - med calendar   Vital Signs:  Patient profile:   75 year old female Height:      67 inches Weight:      217.13 pounds BMI:     34.13 O2 Sat:      97 % on Room air Temp:     96.4 degrees F oral Pulse rate:   91 / minute BP sitting:   118 / 68  (left arm) Cuff size:   large  Vitals Entered By: Boone Master CNA/MA (October 31, 2010 9:00 AM)  O2 Flow:  Room air  Primary Provider/Referring Provider:  [facility]  CC:  est med calendar - pt brought all meds with her today.  c/o dry cough in the mornings x3weeks that causes tightness in chest.  History of Present Illness: 64 yobf never smoker with  known history of  " Difficult t-to-control asthma," dating all the way back to the late 1990's typically flares with non-adherence with GERD rx but remains ICS dependent.  April 22, 2009 ov not seen > year doing well on symbicort as needed maybe once daily but once the weather got hot (x sev months)  started using  symbicort 80 4 puffs in 24 hours and just one albuterol. dyspnea and cough more daytime than night but sometimes it  wakes her  up at night recently also, no excess mucus.  rec Work on inhaler technique:  relax and blow all the way out then take a nice smooth deep breath back in, triggering the inhaler at same time you start breathing in  symbicort 80 2 puffs first thing  in am and 2 puffs again in pm about 12 hours later if needed (the second dose is optional) prevacid 15 mg Take one 30-60 min before first and last meals of the day automatically   June 21, 2009--Returns for follow up and med review. Last visit recommended to add citrucel to regimen however she forgot to go get. Finances play a big role into her meds, she can get generics rx for $5. She was started on prevacid but it is not working. Continues to have upper abd fullness, bloating, and constipation. Her meds were reviewed and pt education was given. no sob or cough  April 07, 2010  Acute visit. Pt  c/o increased SOB since the Spring 2011- states that this has been worse x 1 wk with heavy feeling in chest. She gets SOB walking from one room to the next.  Heat makes breathing worse.  She also c/o dry cough- mainly at bedtime but not waking up once asleep or early in am with resp c/o's . --tx w/ steroid taper.   PAGE 2>>>>>>>>> June 02, 2010 Acute visit.  Pt c/o chest tightness and cough x 1 wk- cough is prod with yellow sputum.  Has had chills but no fever.  She wheezes when lies down and sometimes with exertion.  all this occurred when ran out of ppi assoc with sensation of pnds but no noct awakening or perceived increase need for saba.   >>tx steroid taper for exacerbation. and pepcid increased.   July 05, 2010 --Presents for a follow up and med review. She is feeling much better from asthma flare last ov. She has finished her steroid taper. No overt reflux since increasing pepcid two times a day , no longer on PPI. Rare use of Proair since last ov.  Her rx coverage is okay , she has not filled  rx for diovan or symbicort we sent rx to pharm today to make sure she can afford copay. rec no change in rx  September 20, 2010 ov much worse since ran out of symbicort, didn't know we called it into pharmacy, thinks xopenex twice daily was a reasonable substitute and didn't realize it was already entered on the med cal as a as needed to take in place of proaire, both as needed. Has increase sob, cough and need for saba x several weeks, no purulent sputum.>>no changes, reinforced [facility]   October 31, 2010 --Returns for follow up and med review. Doing well except for tickle in throat w/ drainge also dry cough  x 3 days.  We reviewd all meds and updated med calendar. Denies chest pain, orthopnea, hemoptysis, fever, n/v/d, edema, headache.  No discolored mucus, no otc used. Worse at night and in am with cough and throat ticlke. Wind makes worse.      Impression & Recommendations:  Problem # 1:  CHRONIC  RHINITIS (ICD-472.0)  Meds reviewed with pt education and computerized med calendar completed/adjusted.    May add Zyrtec 10mg  at bedtime as needed for drainage and throat clearing.  Saline nasal rinse as needed for stuffiness Follow med calendar closely and bring to each visit.  Please contact office for sooner follow up  as needed  follow up Dr. Sherene Sires in 3 months   Orders: Est. Patient Level III (04540)  Medications Added to Medication List This Visit: 1)  Oxygen 2 Liters  .... Wear at bedtime 2)  Tramadol Hcl 50 Mg Tabs (Tramadol hcl) .Marland Kitchen.. 1 every 4 hours as needed for cough 3)  Zyrtec Allergy 10 Mg Tabs (Cetirizine hcl) .... Take 1 tab by mouth at bedtime as needed drainage / throat clearing 4)  Ayr Saline Nasal Rinse 1.57 Gm Pack (Sodium chloride-sodium bicarb) .... Use as needed for nasal stuffiness   Physical Exam  Additional Exam:  wt 222 April 22, 2009  > 224 June 07, 2009 > 218 April 08, 2010 >>221 April 21, 2010 > 224 June 02, 2010 >>222 July 05, 2010  > 221 January 245, 2012 >>217 10/31/10  amb hoarse anxious  bf nad  HEENT: nl dentition, turbinates, and orophanx. Nl external ear canals without cough reflex NECK :  without JVD/Nodes/TM/ nl carotid upstrokes bilaterally LUNGS: no acc muscle use, clear to A and P bilaterally without cough on insp or exp maneuvers CV:  RRR  no s3 or murmur or increase in P2, no edema   ABD:  soft and nontender with nl excursion in the supine position. No bruits or organomegaly, bowel sounds nl, no guarding or rebound.  MS:  warm without deformities, calf tenderness, cyanosis or clubbing     Review of Systems      See HPI   Patient Instructions: 1)  May add Zyrtec 10mg  at bedtime as needed for drainage and throat clearing.  2)  Saline nasal rinse as needed for stuffiness 3)  Follow med calendar closely and bring to each visit.  4)  Please contact office for sooner follow up  as needed  5)  follow up Dr. Sherene Sires in 3 months    CC: est med calendar - pt brought all meds with her today.  c/o dry cough in the mornings x3weeks that causes tightness in chest Is Patient Diabetic? Yes Comments Medications reviewed with patient Daytime contact number verified with patient. Boone Master CNA/MA  October 31, 2010 9:04 AM  Medications Prior to Update: 1)  Metformin Hcl 500 Mg  Tabs (Metformin Hcl) .... Take 1 Tablet By Mouth Two Times A Day 2)  Calcium-Vitamin D 500-200 Mg-Unit Tabs (Calcium Carbonate-Vitamin D) .... Take One By Mouth Two Times A Day 3)  Hydrochlorothiazide 50 Mg Tabs (Hydrochlorothiazide) .... 1/2 Tab By Mouth Once Daily 4)  Diovan 160 Mg Tabs (Valsartan) .... Take One By Mouth Once Daily 5)  Oxygen 2 Liters .... At Bedtime and As Needed During The Day 6)  Humalog Mix 75/25 75-25 % Susp (Insulin Lispro Prot & Lispro) .... 24 Units At Bedtime 7)  Clonazepam 2 Mg Tabs (Clonazepam) .... Take One By Mouth  Every Morning and At Bedtime 8)  Symbicort 80-4.5 Mcg/act  Aero (Budesonide-Formoterol Fumarate) .... Inhale 2 Puffs Two Times A Day 9)  Fluticasone Propionate 50 Mcg/act Susp (Fluticasone Propionate) .Marland Kitchen.. 1 Puff Each Nostril  Every Morning and At Bedtime 10)  Vitamin D (Ergocalciferol) 50000 Unit Caps (Ergocalciferol) .Marland Kitchen.. 1 Capsule Every Tuesday and Friday 11)  Simvastatin 40 Mg Tabs (Simvastatin) .... Take 1 Tab By Mouth At Bedtime 12)  Citrucel 500 Mg Tabs (Methylcellulose (Laxative)) .... Once Daily 13)  Pepcid Ac Maximum Strength 20 Mg Tabs (Famotidine) .... One After Breakfast and One At Bedtime 14)  Probiotic  Caps (Probiotic Product) .... Take One By Mouth Once Daily 15)  Proair Hfa 108 (90 Base) Mcg/act  Aers (Albuterol Sulfate) .Marland Kitchen.. 1-2 Puffs Every 4-6 Hours As Needed 16)  Mucinex Dm 30-600 Mg  Tb12 (Dextromethorphan-Guaifenesin) .Marland Kitchen.. 1-2 Every 12 Hours As Needed 17)  Tramadol Hcl 50 Mg Tabs (Tramadol Hcl) .Marland Kitchen.. 1 - 2 Every 4 Hours For Cough 18)  Stool Softener 100 Mg Caps (Docusate Sodium) .Marland Kitchen..  1-2 Capsules By Mouth At Bedtime As Needed 19)  Miralax  Powd (Polyethylene Glycol 3350) .Marland Kitchen.. 1 Capful Two Times A Day As Needed 20)  Gas-X Extra Strength 125 Mg Caps (Simethicone) .... Take With Meals  Current Medications (verified): 1)  Metformin Hcl 500 Mg  Tabs (Metformin Hcl) .... Take 1 Tablet By Mouth Two Times A Day 2)  Calcium-Vitamin D 500-200 Mg-Unit Tabs (Calcium Carbonate-Vitamin D) .... Take One By Mouth Two Times A Day 3)  Hydrochlorothiazide 50 Mg Tabs (Hydrochlorothiazide) .... 1/2 Tab By Mouth Once Daily 4)  Diovan 160 Mg Tabs (Valsartan) .... Take One By Mouth Once Daily 5)  Oxygen 2 Liters .... Wear At Bedtime 6)  Humalog Mix 75/25 75-25 % Susp (Insulin Lispro Prot & Lispro) .... 24 Units At Bedtime 7)  Clonazepam 2 Mg Tabs (Clonazepam) .... Take One By Mouth  Every Morning and At Bedtime 8)  Symbicort 80-4.5 Mcg/act  Aero (Budesonide-Formoterol Fumarate) .... Inhale 2 Puffs Two Times A Day 9)  Fluticasone Propionate 50 Mcg/act Susp (Fluticasone Propionate) .Marland Kitchen.. 1 Puff Each Nostril  Every Morning and At Bedtime 10)  Vitamin D (Ergocalciferol) 50000 Unit Caps (Ergocalciferol) .Marland Kitchen.. 1 Capsule Every Tuesday and Friday 11)  Simvastatin 40 Mg Tabs (Simvastatin) .... Take 1 Tab By Mouth At Bedtime 12)  Citrucel 500 Mg Tabs (Methylcellulose (Laxative)) .... Once Daily 13)  Pepcid Ac Maximum Strength 20 Mg Tabs (Famotidine) .... One After Breakfast and One At Bedtime 14)  Probiotic  Caps (Probiotic Product) .... Take One By Mouth Once Daily 15)  Proair Hfa 108 (90 Base) Mcg/act  Aers (Albuterol Sulfate) .Marland Kitchen.. 1-2 Puffs Every 4-6 Hours As Needed 16)  Mucinex Dm 30-600 Mg  Tb12 (Dextromethorphan-Guaifenesin) .Marland Kitchen.. 1-2 Every 12 Hours As Needed 17)  Tramadol Hcl 50 Mg Tabs (Tramadol Hcl) .Marland Kitchen.. 1 Every 4 Hours As Needed For Cough 18)  Stool Softener 100 Mg Caps (Docusate Sodium) .Marland Kitchen.. 1-2 Capsules By Mouth At Bedtime As Needed 19)  Miralax  Powd (Polyethylene Glycol 3350) .Marland Kitchen.. 1 Capful Two  Times A Day As Needed 20)  Gas-X Extra Strength 125 Mg Caps (Simethicone) .... Take With Meals 21)  Zyrtec Allergy 10 Mg Tabs (Cetirizine Hcl) .... Take 1 Tab By Mouth At Bedtime As Needed Drainage / Throat Clearing 22)  Ayr Saline Nasal Rinse 1.57 Gm Pack (Sodium Chloride-Sodium Bicarb) .... Use As Needed For Nasal Stuffiness  Allergies (verified): 1)  Asa 2)  Tylox 3)  Sulfa 4)  * Meds With Red Dye  Past History:  Past Surgical History: Last updated: 06/22/2009 Cholecystectomy Right rotator cuff repair in 2003 Appendectomy Hysterectomy Lumbar laminectomy in 2002.  She reports chronic left leg numbness and tingling since then.  Family History: Last updated: 06/22/2009 Family history of asthma, heart diease, clotting diorders, and cancer Mother deceased at age 49 due to cancer (unsure what type) Father deceased at age 29 due to heart disease Sister had breast cancer in her 21s.  Social History: Last updated: 06/22/2009 Patient is retired Married with 2 children Never smoked Diabetic  Risk Factors: Smoking Status: never (09/14/2009)  Past Medical History: ASTHMA (ICD-493.90)    - HFA  25% June 07, 2009  > 75% April 08, 2010 > 90% June 02, 2010 > 90% September 19, 2010      -overnight ox 10/21 2% O2 <88%, cont on on nocturnal O2 GERD  clinical dx OBESITY Meds reviewed with pt education and computerized med calendar completed/adjusted. redone June 21, 2009  Diabetes mellitus, type II Hypertension Noncritical coronary artery disease by cardiac catheterization in 1999, with branch vessel coronary artery disease affecting the first obtuse marginal branch. Complex med regimen--Meds reviewed with pt education and computerized med calendar adjusted October 31, 2010    Orders Added: 1)  Est. Patient Level III [99213] Prescriptions: TRAMADOL HCL 50 MG TABS (TRAMADOL HCL) 1 - 2 every 4 hours for cough  #40 x 1   Entered and Authorized by:   Rubye Oaks NP   Signed  by:   Rubye Oaks NP on 10/31/2010   Method used:   Electronically to        [facility] [phone]* (retail)       [address]       [place], [place]  [id number]       Ph: [phone] or [phone]       Fax: [phone]   RxID:   [id number]

## 2010-11-20 LAB — GLUCOSE, CAPILLARY

## 2010-12-21 ENCOUNTER — Other Ambulatory Visit: Payer: Self-pay | Admitting: Endocrinology

## 2010-12-21 DIAGNOSIS — Z1231 Encounter for screening mammogram for malignant neoplasm of breast: Secondary | ICD-10-CM

## 2010-12-27 ENCOUNTER — Ambulatory Visit
Admission: RE | Admit: 2010-12-27 | Discharge: 2010-12-27 | Disposition: A | Discharge status: Home / Self Care | Payer: Medicare Other | Source: Ambulatory Visit | Attending: Endocrinology | Admitting: Endocrinology

## 2010-12-27 DIAGNOSIS — Z1231 Encounter for screening mammogram for malignant neoplasm of breast: Secondary | ICD-10-CM

## 2011-01-09 ENCOUNTER — Emergency Department (HOSPITAL_COMMUNITY): Payer: Medicare Other

## 2011-01-09 ENCOUNTER — Emergency Department (HOSPITAL_COMMUNITY)
Admission: EM | Admit: 2011-01-09 | Discharge: 2011-01-09 | Disposition: A | Discharge status: Home / Self Care | Payer: Medicare Other | Attending: Emergency Medicine | Admitting: Emergency Medicine

## 2011-01-09 DIAGNOSIS — Z794 Long term (current) use of insulin: Secondary | ICD-10-CM | POA: Insufficient documentation

## 2011-01-09 DIAGNOSIS — Y9229 Other specified public building as the place of occurrence of the external cause: Secondary | ICD-10-CM | POA: Insufficient documentation

## 2011-01-09 DIAGNOSIS — W19XXXA Unspecified fall, initial encounter: Secondary | ICD-10-CM | POA: Insufficient documentation

## 2011-01-09 DIAGNOSIS — I10 Essential (primary) hypertension: Secondary | ICD-10-CM | POA: Insufficient documentation

## 2011-01-09 DIAGNOSIS — S93409A Sprain of unspecified ligament of unspecified ankle, initial encounter: Secondary | ICD-10-CM | POA: Insufficient documentation

## 2011-01-09 DIAGNOSIS — E119 Type 2 diabetes mellitus without complications: Secondary | ICD-10-CM | POA: Insufficient documentation

## 2011-01-09 DIAGNOSIS — M25579 Pain in unspecified ankle and joints of unspecified foot: Secondary | ICD-10-CM | POA: Insufficient documentation

## 2011-01-09 LAB — GLUCOSE, CAPILLARY: Glucose-Capillary: 165 mg/dL — ABNORMAL HIGH (ref 70–99)

## 2011-01-09 NOTE — Assessment & Plan Note (Signed)
[facility]                             PULMONARY OFFICE NOTE   NAME:[name] [name], [name] [name]            MRN:          [id number]  DATE:01/14/2007                            DOB:          1936/04/21    PULMONARY AND ACUTE EXTENDED OFFICE VISIT:   HISTORY:  A 75 year old black female with difficult-to-control asthma,  doing great on the combination of QVAR 40 two puffs b.i.d. on her last  visit dated Jan 14, 2007, with minimal need for albuterol.  However,  within the last several weeks to about a month she has noticed increased  dyspnea on exertion associated with chest tightness that does not  necessarily improve with albuterol.  For that reason, she underwent  evaluation by Dr. Evlyn Kanner and Dr. Amil Amen, and the bottom line is she has  been told she needs a heart catheterization, and the plan is to do it  next week.  She has minimal itching, sneezing, subjective wheezing, and  does not really notice any classic orthopnea or nocturnal exacerbation.  She denies any resting chest discomfort or paroxysms of dyspnea that  occur either at rest or nocturnally and denies any overt reflux  symptoms, having stopped reflux empiric medicines over 6 months ago.   MEDICATIONS:  For full information on medications, please see face sheet  dated Jan 14, 2007, but note the patient did not bring her medication  calendar with her today as requested.  She says, all  the medicines are  the same, and that Dr. Amil Amen did not change any other than to add  p.r.n. nitroglycerin.   PHYSICAL EXAMINATION:  GENERAL:  She is a pleasant, ambulatory, obese,  black female in no acute distress.  VITAL SIGNS:  She has gained another 7 pounds and is at 227 now with  blood pressure 130/84, pulse rate 60 and regular.  HEENT:  Unremarkable.  Oropharynx clear.  NECK:  Supple without adenopathy or tenderness.  Trachea is midline.  CHEST:  Lung fields are clear bilaterally to auscultation  and  percussion.  There is no wheeze, pseudo or otherwise.  CARDIAC:  Regular rate and rhythm without murmur, rub, or gallop.  ABDOMEN:  Obese, benign.  EXTREMITIES:  Warm without calf tenderness, cyanosis, or clubbing.   MDI technique was reviewed, and to my dismay, I realize she is only  about 25% effective.  Even with coaching, she only approaches 50%.   IMPRESSION:  Chest tightness with exertion, may or may not represent  angina but is unlikely to represent asthma because she is not having  nocturnal exacerbations.  On the other hand even if she were, she is not  getting enough albuterol down to establish whether or not there is a  benefit to using this drug.   Because there is, of course, a danger in overusing albuterol in the  setting of ischemic heart disease, I have advised her, therefore, only  to use albuterol under rescue circumstances and to continue the Qvar 40  two puffs b.i.d. with continued effort to optimize delivery.   The next issue is medication reconciliation.  I spent extra time with  this patient again today  trying to help her understand that the  instructions she gets from doctors need to be on a single sheet of  paper, creating a paper trail if you will of maintenance versus p.r.n.  medications.  If she cannot do this consistently, then there is very  little I can do to optimize her outpatient management and will advise  Dr. Evlyn Kanner and Dr. Amil Amen of this as well.   The only other issue I might consider is adding back empiric proton pump  inhibitor therapy because some of her chest tightness and dyspnea may  very well represent reflux given the fact that she gained weight mostly  in the abdominal compartment.  However, I will await Dr. Amil Amen'  evaluation before adding back this empirically.   I have arranged to see her back in 6 weeks but, of course, will see her  sooner if she has any increased symptoms suggesting asthma, and will  anxiously await the  cardiac evaluation planned by Dr. Amil Amen in regards  to whether or not it helps her symptoms with activity at this point.     Charlaine Dalton. Sherene Sires, MD, [facility]  Electronically Signed    MBW/MedQ  DD: 01/14/2007  DT: 01/14/2007  Job #: [id number]   cc:   Jethro Bastos, M.D.  Francisca December, M.D.  Tera Mater. Evlyn Kanner, M.D.

## 2011-01-12 NOTE — Consult Note (Signed)
[facility]  Patient:    [name] [name], [name] [name] Visit Number: [id number] MRN: [id number]          Service Type: SUR Location: 3W [id number] 01 Attending Physician:  Erasmo Leventhal Dictated by:   Barry Dienes Eloise Harman, M.D. Proc. Date: 09/20/01 Admit Date:  09/19/2001   CC:         Jeannett Senior A. Evlyn Kanner, M.D.   Consultation Report  PHYSICIAN REQUESTING CONSULTATION:  Hayden Rasmussen, M.D.  REASON FOR CONSULTATION:  Management of diabetes mellitus type II and hypertension.  HISTORY OF PRESENT ILLNESS:  The patient is a 75 year old black female who had redo right shoulder surgery yesterday.  She has multiple medical problems as described below.  Currently her pain control is fair but she notes decreased appetite and no bowel movement since admission.  She had substernal chest pain yesterday but none today and denies shortness of breath.  PAST MEDICAL HISTORY: 1. Hypertension. 2. Diabetes mellitus type II since 1999. 3. Peripheral neuropathy. 4. Gastroesophageal reflux disease. 5. Branch vessel coronary artery disease affecting the first obtuse marginal    branch. 6. Hyperlipidemia. 7. Osteoarthritis of the cervical spine and lumbar spine.  ALLERGIES:  SULFA and TYLOX.  PAST SURGICAL HISTORY: 1. C-sections x2. 2. Bilateral ear operations. 3. Cholecystectomy. 4. Appendectomy. 5. Total abdominal hysterectomy. 6. In May 1999, cardiac catheterization showing 70% stenosis of the proximal    first OMB and nonobstructive coronary artery disease. 7. Operation of the right rotator cuff in 2002.  CURRENT MEDICATIONS: 1. Tiazac 120 mg p.o. q.d. 2. Sliding scale regular insulin as needed. 3. Colace 100 mg p.o. b.i.d. 4. Glucophage 500 mg p.o. b.i.d. 5. Altace 2.5 mg p.o. q.d. 6. 70/30 insulin 25 units subcutaneous q.p.m. 7. Ancef. 8. Reglan 10 mg p.o. q.6h. p.r.n. nausea.  PHYSICAL EXAMINATION:  VITAL SIGNS:  Blood pressure 126/57, pulse 80,  respiratory rate 24, temperature 101.5.  Capillary blood glucose levels have been 120, 127, 165, 156.  GENERAL:  She is a mildly overweight black female in no apparent distress.  CHEST:  Clear to auscultation.  HEART:  Regular rate and rhythm, S1 and S2 without murmur, gallop, or rub. ABDOMEN:  Normal bowel sounds with no hepatosplenomegaly or tenderness.  EXTREMITIES:  Warm feet with good pulses and decreased light touch in both feet with claw toe deformities.  LABORATORY STUDIES:  CPK levels were 145, 146, and 109.  Troponin I levels x3 were less than 0.01.  CHEST X-RAY:  Within normal limits.  IMPRESSION/PLAN: 1. Diabetes mellitus type II.  This is well controlled on her current medical    regimen. 2. Fever.  This is likely secondary to her postoperative state, possibly due    to postoperative atelectasis.  There are no focal signs of infection.  Plan    to check a urinalysis, urine culture, blood cultures x2 sets, and a CBC.    At this time, there will be no change in her Ancef treatment. 3. Hypertension.  This is well controlled on her current regimen. Dictated by:   Barry Dienes. Eloise Harman, M.D. Attending Physician:  Erasmo Leventhal DD:  09/20/01 TD:  09/21/01 Job: [id number]

## 2011-01-12 NOTE — H&P (Signed)
[facility]  Patient:    [name] [name], [name] [name] Visit Number: [id number] MRN: [id number]          Service Type: Attending:  R. Valma Cava, M.D. Dictated by:   Irena Cords, P.A.C. Adm. Date:  09/19/01                           History and Physical  DATE OF BIRTH:  [birth year]  SOCIAL SECURITY #:  [id number]  CHIEF COMPLAINT:  Right shoulder pain.  HISTORY OF PRESENT ILLNESS:  [name] [name] is a 75 year old right-hand dominant female who presents with complaints of recurrent right shoulder pain. She had previously undergone an arthroscopy rotator cuff repair by Dr. Thomasena Edis in May 2000.  She had done well, but over the last few months she has had increasing right shoulder pain.  It now hurts more than it did before her original surgery.  She reports pain with attempts at combing her hair as well as difficulty sleeping on the shoulder at night.  She underwent an arthrogram which demonstrated a recurrent complete rotator cuff tear.  She has elected to undergo surgical intervention at this time.  REVIEW OF SYSTEMS:  She denies any recent fever or chills.  No rhinorrhea, sore throat, or earache.  No chest pain, shortness of breath, or cough.  Does report an occasional stomach ache.  No nausea, vomiting, diarrhea, or constipation.  No melena or bright red blood per rectum.  Some urinary nocturia secondary to her diabetes, but otherwise no urinary frequency, hematuria, or dysuria.  She does have some mild symptoms of peripheral neuropathy secondary to her diabetes.  PAST MEDICAL HISTORY: 1. Hypertension. 2. Asthma. 3. Diabetes. 4. She denies strokes, seizures, cancer, kidney disease, or coronary artery    disease.  PAST SURGICAL HISTORY: 1. C-section x 2. 2. Bilateral ear surgery. 3. Appendectomy. 4. Cholecystectomy. 5. Hysterectomy. 6. Lumbar laminectomy September 2000. 7. Rotator cuff repair May 2000. 8. Negative cardiac  catheterization.  ALLERGIES:  SULFA, causing swelling.  TYLOX, causing difficulty breathing.  CURRENT MEDICATIONS: 1. Glucophage 500 mg b.i.d. 2. Insulin 25 units NovoLog 70/30 at suppertime. 3. Arthrotec. 4. Vicodin. 5. Aspirin, which she is to stop five days before surgery. 6. Altace, the dose is questionable.  It is probably 2.5 mg a day.  She will    bring her medicine with her to the hospital to clarify.  FAMILY HISTORY:  Significant for MI, hypertension, coronary artery disease in her father.  SOCIAL HISTORY:  She is married.  She has two adult children who are alive and well.  She is currently out of work due to her workmens compensation injury. She denies tobacco or alcohol use.  PHYSICAL EXAMINATION:  VITAL SIGNS:  Afebrile, pulse 72, respiratory rate 14, blood pressure 144/88.  GENERAL:  Well-developed, well-nourished female in no acute distress.  HEENT:  Head atraumatic, normocephalic.  Pupils are equal, round, and reactive to light.  Extraocular movements grossly intact.  Oropharynx clear, without redness, exudate, or lesions.  NECK:  Supple.  No cervical lymphadenopathy.  CHEST:  Clear to auscultation bilaterally.  No wheezes or crackles.  HEART:  Regular rate and rhythm.  No murmurs, rubs, or gallops.  ABDOMEN:  Soft, nontender, nondistended.  No masses, no hepatosplenomegaly. Positive bowel sounds.  BREASTS, GENITOURINARY:  Not examined, not pertinent to present illness.  SKIN:  Intact.  Without rashes or lesions.  EXTREMITIES:  With 2+ radial pulses on the affected side.  Motor and sensory exams grossly intact on the affected side.  Negative tenderness at [facility] joint. She is tender, though, over the anterior and lateral aspects of the shoulder. She has weak external rotators.  Forward flexes to 140 degrees and abducts to 140 degrees.  IMPRESSION: 1. Right shoulder recurrent rotator cuff tear and bursitis. 2. Diabetes. 3. Asthma. 4.  Hypertension.  PLAN:  The patient will be admitted to [facility] to undergo a right shoulder open rotator cuff repair and restore patch with a possible biceps tenodesis by Dr. Thomasena Edis on September 19, 2001.  Preoperative laboratories and signed surgical consents will be obtained.  All questions have been encouraged and answered. Dictated by:   Irena Cords, P.A.C. Attending:  R. Valma Cava, M.D. DD:  09/09/01 TD:  09/09/01 Job: [id number]

## 2011-01-12 NOTE — H&P (Signed)
NAME:  [name] [name], [name] [name]               ACCOUNT NO.:  [id number]   MEDICAL RECORD NO.:  [id number]                   PATIENT TYPE:  EMS   LOCATION:  [facility]                                 FACILITY:  [facility]   PHYSICIAN:  Sherin Quarry, MD                   DATE OF BIRTH:  01/26/1936   DATE OF ADMISSION:  07/03/2003  DATE OF DISCHARGE:                                HISTORY & PHYSICAL   HISTORY OF PRESENT ILLNESS:  Stepfanie Yott is a 75 year old lady who  presents to the [facility] Emergency Room with a history of onset of  shortness of breath about six weeks prior to presentation.  She presented  with this complaint to Dr. Jethro Bastos and was given a tapering  schedule of prednisone over seven days.  This seemed to give her transient  improvement, but then her condition seemed to worsen in the last week.  At  this point, she reports that she becomes short of breath with walking about  50 feet and that she has difficulty sleeping secondary to persistent  coughing.  She has not noted any exertional chest pain or chest pain at  rest.  There has been no ankle edema or paroxysmal nocturnal dyspnea.  Of  note is that Dr. Lyn Records did a cardiac catheterization on the patient  in 1999, which was reported to be normal.  On presentation to the emergency  room, a chest x-ray was obtained which was interpreted as showing increased  interstitial edema consistent with congestive heart failure.  For this  reason, admission was recommended.   PAST MEDICAL HISTORY:  Past medical history is reviewed.   MEDICATIONS:  The patient takes:  1. Glucophage 500 mg b.i.d.  2. Insulin 70/30 -- 23 units at suppertime daily.  3. Hydrocodone p.r.n.  4. Altace 2.5 mg one tablet daily.  5. Toprol-XL 50 mg daily.   ALLERGIES:  She is allergic to SULFA DRUGS and ASPIRIN.   OPERATIONS:  Operations have included:  1. C-section x2.  2. Previous ear surgery.  3. Appendectomy.  4.  Cholecystectomy.  5. Hysterectomy.  6. Lumbar laminectomy, September 2000.  7. Three previous rotator cuff surgeries.   FAMILY HISTORY:  Family history is remarkable for coronary artery disease  and hypertension in the patient's father.   SOCIAL HISTORY:  The patient is currently disabled secondary to a work-  related injury.  She denies alcohol or drug abuse.  She does not smoke.  She  has two adult children who assist in her care.   REVIEW OF SYSTEMS:  HEAD:  She denies headache or dizziness.  EYES:  She  denies visual blurring or diplopia.  EAR, NOSE AND THROAT:  Denies earache,  sinus pain or sore throat.  CHEST:  See above.  CARDIOVASCULAR:  See above.  GI:  She denies nausea, vomiting, abdominal pain, change in bowel habits,  melena or hematochezia.  GU:  She denies  dysuria, urinary frequency,  hesitancy or nocturia.  RHEUMATOLOGIC:  She denies back pain or joint pain.  HEMATOLOGIC:  She denies easy bleeding or bruising.  NEUROLOGIC:  There is  no history of syncope.  There is no history of seizure or stroke.   PHYSICAL EXAMINATION:  GENERAL:  On physical exam, she is a relatively  comfortable, well-appearing lady who exhibits no respiratory distress.  HEENT:  Exam is within normal limits.  CHEST:  The chest is quite clear to auscultation.  I do not hear any  wheezing.  There are no rales or rhonchi.  CARDIOVASCULAR:  Exam reveals normal S1 and S2.  There are no rubs, murmurs  or gallops.  ABDOMEN:  The abdomen is benign.  There are normal bowel sounds, without  masses, tenderness or organomegaly.  NEUROLOGIC:  Neurologic testing is within normal limits.  EXTREMITIES:  Examination of extremities revealed no evidence of cyanosis or  edema.  DP and PT pulses were normal.   LABORATORY AND ACCESSORY CLINICAL DATA:  I reviewed the patient's chest x-  ray, which appears to show evidence of borderline cardiomegaly.  It would be  my interpretation that the patient's interstitial  markings are not  especially prominent.   Laboratory studies included a CBC which revealed a white count of 9200.  Hemoglobin is 12.7.  CMET was remarkable for normal renal function and liver  profile.   PLAN:  In light of the patient's dyspnea, we will admit her to the hospital  for further evaluation.  We will rule out MI with serial cardiac enzymes.  A  BNP and 2-D echocardiogram will be obtained to evaluate cardiac function.  Although the patient's cough is of relatively recent onset, we will hold her  ACE inhibitor and have her use an ARB product instead.  We will continue her  beta blocker.  If these studies are unrevealing, conceivably it might be  reasonable to have Dr. Katrinka Blazing reevaluate her with an eye toward doing a  Cardiolite study.                                                Sherin Quarry, MD    SY/MEDQ  D:  07/04/2003  T:  07/04/2003  Job:  [id number]   cc:   Jethro Bastos, M.D.  [address]  [place]  [place] [address]  Fax: [phone]   Lyn Records III, M.D.  [address]  [address]  [place]  [place] [address]  Fax: [phone]

## 2011-01-12 NOTE — Consult Note (Signed)
[facility]  Patient:    [name] [name], [name] [name] Visit Number: [id number] MRN: [id number]          Service Type: SUR Location: 3W 0353 01 Attending Physician:  Erasmo Leventhal Dictated by:   Meade Maw, M.D. Proc. Date: 09/19/01 Admit Date:  09/19/2001   CC:         Francisca December, M.D.   Consultation Report  REFERRING PHYSICIAN:  R. Valma Cava, M.D.  REASON FOR CONSULTATION:  Chest pain.  HISTORY:  [name] [name] is a 75 year old female who was admitted and has recently underwent a rotator cuff repair of her right shoulder. Following surgery, she developed an episode of substernal chest pain described as a heaviness, scored as a 5/10, and relieved with one sublingual nitroglycerin. There was no associated nausea, vomiting, diaphoresis; the pain was nonradiating. ECG was obtained and ECG in recovery room revealed a sinus rhythm. There was biphasic T wave noted in the precordial lead as well as nonspecific ST changes in the inferior leads. This was compared to her previous EKG of November 2002. This ECG revealed nonspecific ST depression in the inferior leads and slight biphasic T waves. Previous cardiac workup as performed in 1999 by Dr. Katrinka Blazing revealed normal ventriculogram, no coronary arteries with nominal irregularities in the left anterior descending. There was a 70% stenosis noted in the first obtuse marginal.  REVIEW OF SYSTEMS:  On review of symptoms, Tine has had recurrent chest pain since her heart catheterization. Her activity has been limited by her back pain. She will note the chest pain with rest and with exertion. She has two to three frequency per month. She has had no change in her bowel habits. Review of systems otherwise negative.  PAST MEDICAL HISTORY: 1. Hypertension. 2. Asthma. 3. Diabetes. 4. Noncritical coronary artery disease as noted above. 5. Anxiety associated with depression.  PAST SURGICAL  HISTORY: 1. Cesarean section x 2. 2. Bilateral ear surgery. 3. Appendectomy. 4. Cholecystectomy. 5. Hysterectomy. 6. Lumbar laminectomy in September of 2002. 7. Rotator cuff repair, May of 2002.  SOCIAL HISTORY:  She is a nonsmoker, married, works for a [employer]. Primary physician is Dr. Modena Jansky.  FAMILY HISTORY:  Father passed at age 41 from myocardial infarction. Mother is 33. She has four sisters.  ALLERGIES:  No known drug allergies.  CURRENT MEDICATIONS: 1. Glucotrol 500 mg b.i.d. 2. Sliding scale insulin. 3. Arthrotec. 4. Vicodin. 5. Aspirin, which was stopped five days prior to surgery. 6. Altace 2.5 mg p.o. q.d.  PHYSICAL EXAMINATION:  GENERAL:  Middle aged female in mild distress from right shoulder pain.  VITAL SIGNS:  Blood pressure is 147/86, heart rate is 76, respiratory rate 15, O2 saturation is 96%.  HEENT:  Unremarkable.  NECK:  There is no evidence of neck vein distention. There are no carotid bruits noted.  PULMONARY:  Breath sounds equal and clear to auscultation. No use of accessory muscles.  CARDIOVASCULAR:  Irregular rate and rhythm. Normal S1, normal S2. No rubs, murmurs, or gallops noted.  ABDOMEN:  Soft, benign, nontender. No hepatosplenomegaly. Abdomen is distended. There are currently no bowel sounds noted.  EXTREMITIES:  PTs which are 2+ and palpable. Her DPs are 1+. There is no peripheral edema.  NEUROLOGICAL:  Nonfocal. Motor is not assessed.  LABORATORY DATA:  Sodium is 137, potassium is 3.6, chloride is 106, glucose is 136, BUN 13, creatinine 0.9. Creatinine kinase is 109. The MB fraction is negative at 1.5. Troponin I is 0.01.  IMPRESSION:  Substernal chest pain which has been ongoing and chronic in this 75 year old female. Coronary angiography in 1999 revealed noncritical coronary artery disease. Chest pain was promptly relieved with one sublingual nitroglycerin.  PLAN:  At this point, in view of her recent surgery, I  would not initiate heparin. I would restart aspirin in the a.m. Etiologies for chest pain may be related to coronary spasms, esophageal spasms, or gastrointestinal etiology. At this point, I would initiate Protonix, telemetry monitoring overnight. If no further chest pain, no further cardiac workup is indicated. May use sublingual nitroglycerin for treatment of possible esophageal spasms. Would change her hypertensive medication from Altace to Cardizem CD 180 mg daily as an empiric treatment for possible coronary spasms. If there is no improvement in her chest pain, would switch back to Altace and this would be a better antihypertensive medication in this diabetic female. Dictated by:   Meade Maw, M.D. Attending Physician:  Erasmo Leventhal DD:  09/19/01 TD:  09/20/01 Job: [id number]

## 2011-01-12 NOTE — Assessment & Plan Note (Signed)
[facility]                               PULMONARY OFFICE NOTE   NAME:Blixt, RODOLFO NOTARO            MRN:          [id number]  DATE:04/16/2006                            DOB:          [birth year]    HISTORY OF PRESENT ILLNESS:  This is a 75 year old, black female with  documented nonadherence who comes in today again without the medication she  was provided.  She has done very well, however, symptomatically on QVAR at  40 mg, 2 puffs b.i.d. which she says she still takes consistently and has  improved activity tolerance, no nocturnal wakening and has only used  albuterol maybe three times since her last visit 3 months ago.   PHYSICAL EXAMINATION:  GENERAL:  She is an obese, ambulatory, black female  in no acute distress.  VITAL SIGNS:  Weight 225 pounds, up another 7 pounds from previous visit.  HEENT:  Unremarkable.  Oropharynx clear.  LUNGS:  Lung fields are completely clear bilaterally, clear to auscultation  and percussion.  HEART:  Regular rate and rhythm without murmurs, rubs or gallops.  ABDOMEN:  Soft, benign.  EXTREMITIES:  Warm without calf tenderness, clubbing, cyanosis or edema.   PFTs from July 24, 2005, show minimal air flow obstruction compared to  marked air flow obstruction on previous study dated September 21, 2004, which  at that point also showed a 26% improvement after bronchodilators.   IMPRESSION:  This patient clearly has asthma and also documented  nonadherence.  Hopefully, she sees the light at this point and is  evidenced by the fact that she has basically normalized her function and  minimized her albuterol use while maintaining QVAR at 40 two puffs b.i.d.  I  emphasized to the patient that it is unlikely that she will be able to  maintain her same level of function without the QVAR and gave her samples  and a prescription to refill.  It remains to be seen whether she will  actually follow the instructions she  has been given.   I did emphasize to her that I would like her to show the medication calendar  that we provided her here to all doctors she sees to have a paper trail of  all of the recommendations that were made and to keep up with them better  than she has in the past.  However, we have had the same conversation before  without much luck in this regard.  I did spend extra time also making sure  that she could use MDI technique adequately (she improved from 50% to near  90% with instruction on optimal MDI technique.   PLAN:  Followup will be in 3 months with PFTs, sooner if needed.                                   Charlaine Dalton. Sherene Sires, MD, [facility]   MBW/MedQ  DD:  04/16/2006  DT:  04/16/2006  Job #:  [id number]   cc:   Jethro Bastos, MD

## 2011-01-12 NOTE — Assessment & Plan Note (Signed)
[facility]                               PULMONARY OFFICE NOTE   NAME:[name], [name] BARBIER            MRN:          [id number]  DATE:07/15/2006                            DOB:          Jan 13, 1936    HISTORY:  This is a 75 year old black female with a history of difficult to  control asthma and documented nonadherence who comes in today stating she  stopped her Lipitor and reduced her Qvar from 40, 2 puffs b.i.d. down to 1  puff b.i.d. because you said to. The record indicates actually I went over  line by line her medication calendar when she was here before and emphasized  that she was to stay on the 2 puffs b.i.d. Serendipitously however, she has  done well on the 1 puff b.i.d. with rare need for albuterol and comes back  today for PFTs as requested. She denies any nocturnal wheeze or need for  albuterol since her previous visit. For full inventory of medications,  please see face sheet dated July 15, 2006.   PHYSICAL EXAMINATION:  GENERAL:  She is a pleasant, ambulatory, obese, black  female in no acute distress.  VITAL SIGNS:  Stable. Weight of 220 pounds which is down 5 pounds.  HEENT:  Unremarkable. Pharynx clear.  LUNGS:  Fields are clear bilaterally to auscultation and percussion.  HEART:  Regular rate and rhythm without murmur, gallop or rub.  ABDOMEN:  Soft, benign, but obese.  EXTREMITIES:  Warm without calf tenderness, cyanosis, clubbing or edema.   Spirometry today reveals an FEV1 of 1.64 which is 70% of predicted with a  ratio of 64% consistent with mild air flow obstruction. This has really not  changed from previous visit and shows no additional improvement after  bronchodilators.   IMPRESSION:  Difficult to control asthma. It is only difficult because the  patient fails to follow the instructions she is given. Consistently fails to  follow them even when they are given in a very user friendly and unambiguous  fashion.  In this particular case, I am going to move the mountain to  [place] and emphasize to the patient for cost saving purposes if she wants  to reduce the Qvar down to 1 puff twice a day, she can do this as long as  she is having no breakthrough symptoms or increased need for albuterol and  nocturnal awakening.   However, I emphasized to her that she should keep up with the medication  calendar in detail because it reflects my most recent recommendations and I  put today's date at the top to emphasize to her that it was reviewed and  then went over it line by line with her.   I also documented that her MDI technique is exceptionally good now whereas  previously it was poor. This may be why she has been able to reduce the Qvar  without a flare-up.   Followup will be in 6 months. We need to make sure she is not having  progressive remodeling or reduction in best day function related to under  treatment of airway inflammation and if so push the Qvar harder  if she will  allow [place] too.     Charlaine Dalton. Sherene Sires, MD, [facility]  Electronically Signed    MBW/MedQ  DD: 07/15/2006  DT: 07/15/2006  Job #: [id number]   cc:   Jethro Bastos, M.D.

## 2011-01-12 NOTE — Discharge Summary (Signed)
[facility]  Patient:    [name] [name], [name] [name] Visit Number: [id number] MRN: [id number]          Service Type: SUR Location: 3W 0353 01 Attending Physician:  Erasmo Leventhal Dictated by:   Marcie Bal Velna Ochs, P.A.-C Admit Date:  09/19/2001 Discharge Date: 09/22/2001   CC:         Meade Maw, M.D.  Barry Dienes Eloise Harman, M.D.  Tera Mater. Evlyn Kanner, M.D.   Discharge Summary  PRINCIPAL DIAGNOSES: 1. Right shoulder recurrent rotator cuff tear and bursitis. 2. Chest pain.  SECONDARY DIAGNOSES: 1. Hypertension. 2. Diabetes mellitus type 2. 3. Peripheral neuropathy. 4. Gastroesophageal reflux disease. 5. Hyperlipidemia. 6. Branch vessel coronary artery disease affecting the first obtuse marginal    branch.  SURGICAL PROCEDURE:  Right shoulder open exploration with rotator cuff repair and augmentation with Restore patch by Dr. Thomasena Edis with the assistance of Haze Rushing, P.A.-C on September 19, 2001.  Please see operative summary for further details.  CONSULTATIONS: 1. Dr. Jarome Matin in medicine. 2. Dr. Meade Maw in cardiology.  LABORATORY DATA:  Preoperative hemoglobin and hematocrit were 12.9 and 38.3, respectively.  Postoperatively on September 20, 2001, her hemoglobin and hematocrit were 11.6 and 34.2, respectively.  Routine chemistries preoperatively were all within normal limits with the exception that glucose was elevated at 136.  Her BUN and creatinine were 13 and 0.9, and sodium and potassium were 137 and 3.6, respectively.  Urinalysis preoperatively was all within normal limits with negative parameters.  A repeat on September 21, 2001, also was all within normal limits with negative parameters.  Blood culture from September 20, 2001, showed no growth and no organisms.  Cardiac enzymes on September 19, 2001, and then continued on September 20, 2001, showed a CK ranging from 109 to 146.  Troponin-I all three were less than 0.01.   CK-MB ranged from 1.4 to 2.1, with a relative index of 1.0 to 1.4.  EKG on September 19, 2001, showed normal sinus rhythm with sinus arrhythmia.  There was no significant change since the prior tracing.  CHIEF COMPLAINT:  Right shoulder pain.  HISTORY OF PRESENT ILLNESS:  [name] [name] is a 75 year old right-hand dominant female who had previously undergo right shoulder rotator cuff repair in May 2000.  She had done fairly well, but over the last few months had increasing right shoulder pain.  An arthrogram was obtained which demonstrated recurrent complete rotator cuff tear.  She elected to undergo surgical intervention on that shoulder.  HOSPITAL COURSE:  Following the surgical procedure, the patient was taken to the PACU in stable condition, transferred to the orthopedic floor in good condition.  On the first operative night she developed an episode of chest pain.  Dr. Meade Maw in cardiology was consulted.  The patient was transferred to the telemetry unit.  She had serial EKGs as well as serial cardiac enzymes, all of which showed no evidence of cardiac ischemia.  It was felt this was probably coronary spasm.  Dr. Fraser Din recommended no further cardiac workup.  Dr. Eloise Harman in medicine was also consulted for management of the patients diabetes and hypertension.  Occupational therapy was consulted for pendulum exercises and activities of daily living.  Wound was examined on postoperative day #2, and subsequently found to be clean, dry, and intact without signs or symptoms of infection.  She had no further episodes of chest pain or shortness of breath.  By postoperative day #3, on September 22, 2001, she was stable and ready  for discharge home.  DISPOSITION:  The patient is being discharged home.  DIET:  Diabetic.  FOLLOWUP: 1. Dr. Thomasena Edis in 10 days, call [phone] for an appointment. 2. Follow up with her medical physician and cardiologist as needed.  ACTIVITY:  Gentle  pendulum exercises to the shoulder.  Remain in the immobilizer at all other times.  No lifting with the shoulder.  Continue finger and elbow range of motion.  WOUND CARE:  Once daily dressing changes to the shoulder.  She may begin showering once daily starting on postoperative day #5.  DISCHARGE MEDICATIONS: 1. Robaxin 500 mg p.o. q.8h. p.r.n. spasm. 2. Mepergan Fortis one or two q.4-6h. p.r.n. pain. 3. Glucophage 500 mg p.o. b.i.d. 4. Insulin 25 units Novolog 70/30 at suppertime. 5. Altace 2.5 mg p.o. q.d. 6. Cardizem CD 120 mg p.o. q.d. 7. Protonix 40 mg p.o. q.d.  CONDITION ON DISCHARGE:  Good and improved. Dictated by:   Marcie Bal Velna Ochs, P.A.-C Attending Physician:  Erasmo Leventhal DD:  09/22/01 TD:  09/22/01 Job: [id number]

## 2011-01-12 NOTE — Op Note (Signed)
[facility]  Patient:    [name] [name], [name] [name] Visit Number: [id number] MRN: [id number]          Service Type: SUR Location: 3W [id number] [id number] Attending Physician:  Erasmo Leventhal Dictated by:   R. Valma Cava, M.D. Proc. Date: 09/19/01 Admit Date:  09/19/2001                             Operative Report  PREOPERATIVE DIAGNOSES:  Right shoulder probable recurrent rotator cuff tear versus incomplete healing of initial repair.  POSTOPERATIVE DIAGNOSES:  Right shoulder 1.5 cm recurrent rotator cuff tear versus incomplete healing of primary repair.  PROCEDURE:  Right shoulder open exploration with rotator cuff repair, augmentation with Restore patch.  SURGEON:  R. Valma Cava, M.D.  ASSISTANT:  Marcie Bal. Troncale, P.A.C.  ANESTHESIA:  Interscalene block, general.  ESTIMATED BLOOD LOSS:  Less than 10 cc.  DRAINS:  None.  COMPLICATIONS:   None.  DISPOSITION:  PACU stable.  DESCRIPTION OF PROCEDURE:  The patient was counseled in the holding area, correct size identified. Chart reviewed and signed appropriately. IV started and antibiotics were given. Block was administered. The patient was taken to the OR, placed in supine position, general anesthesia and then placed in modified beach-chair position. The right shoulder examined and a full range of motion and stable. Prepped with Duraprep and arm draped in a sterile fashion.  One of the lateral portal sites was continued anteriorly and posteriorly and Langers lines for lateral approach. The skin flaps were mobilized at the appropriate level. At this time, the deltoid was split for a total of 3 cm, a tag suture was placed there, axillary node was ______ off and protected throughout the entire case. The deltoid was split but it was not significantly detached from the acromion. It was then opened. The subacromial region had a few adhesions being released with my index finger. The  rotator cuff now was thoroughly inspected. The majority of the repair had healed but there was a 1.5 cm area just posterior to the biceps tendon where it had not healed completely. This was then freshened back to healthy tissue with a 15 blade. Old sutures were removed.  The biceps tendon was inspected and found to be satisfactory and did not require biceps tendon releases. The bone was roughened at the insertion site and an Arthrocare bioabsorbable anchor was placed and a corkscrew anchor was placed there. The rotator cuff initially underwent a repair with this anchor, given a nice tight repair. However, to increase the healing response, I felt it would be best to add a Restore patch to this and I had discussed this with the patient preoperatively, the graft, the nature of the graft being porcine, etc., and she had wished to proceed. At this point in time, a DePuy Restore patch had been taken, appropriately handled and cut to fit the area and then sewn down with a running 2-0 PDS suture following standard surgical protocol. This gave an excellent augmentation to the repair circumferentially. Shoulder put through a range of motion. There was on suture impingement. The area of the subacromial region had been well decompressed. There were no other abnormality. After a thorough inspection, the wound were copiously irrigated. The tag suture was removed, the deltoid fascia was repaired with Vicryl, subcu Vicryl, skin with subcuticular Monocryl suture. After conferring with anesthesia, another 10 cc of 0.5% Marcaine with epinephrine was placed within the skin edges  to supplement the block to get an approval from anesthesia. Benzoin and Steri-Strips, dry sterile compressive dressing, and placed into a sling. She was also given another gram of Ancef intravenously. There were no complications. Sponge and needle count were correct. We were meticulous with the patients positioning done throughout the  entire procedure. She was then awakened and extubated and taken from the operating room to PACU in stable condition. Sponge and needle count were correct. No complications. Dictated by:   R. Valma Cava, M.D. Attending Physician:  Erasmo Leventhal DD:  09/19/01 TD:  09/20/01 Job: [id number]

## 2011-01-12 NOTE — Discharge Summary (Signed)
NAME:  [name] [name], [name] [name]               ACCOUNT NO.:  [id number]   MEDICAL RECORD NO.:  [id number]                   PATIENT TYPE:  INP   LOCATION:  4714                                 FACILITY:  [facility]   PHYSICIAN:  Melissa L. Ladona Ridgel, MD               DATE OF BIRTH:  23-Nov-1935   DATE OF ADMISSION:  07/03/2003  DATE OF DISCHARGE:  07/06/2003                                 DISCHARGE SUMMARY   PHYSICIAN:  Dr. Jethro Bastos, M.D., primary care physician at [facility].   DISCHARGE DIAGNOSES:  1. Respiratory distress secondary to mild congestive heart failure/reactive     airway disease, improved.  2. Type 2 diabetes, controlled.  3. Hypertension, controlled.   DISCHARGE MEDICATIONS:  1. Altace 2.5 mg q.d.  2. Toprol 50 mg q.d.  3. Glucophage 500 mg b.i.d.  4. Novolog insulin 70/30 with 23 units supper time.  5. HCTZ 25 mg q.d.  6. Azithromycin 500 mg p.o. x 1 and then 250 mg p.o. x 4 days.  7. Albuterol and Atrovent nebulizers q.6h. for the next week and then     Combivent inhaler 2 puffs q.i.d. p.r.n.   ALLERGIES:  TYLOX, SULFA, and ASPIRIN.   PROCEDURE:  None.   HISTORY OF PRESENT ILLNESS:  A 75 year old black female presents to the ER  with history of shortness of breath x 6 weeks which has been worse for one  week.  She can only walk approximately 50 feet before she is short of breath  and has to stop.  She coughs when she lies down and has to sleep propped up  on pillows.  She denies chest pain, edema, or PND.  Of note, is that Dr.  Katrinka Blazing did a cardiac catheterization in 1999 which was normal according to  the patient.   On presentation, she was noted to have chest x-ray interpreted as showing  mild congestive heart failure and admission was recommended.  The patient  was admitted for further evaluation and treatment.   HOSPITAL COURSE:  The patient was admitted to telemetry bed.  A 12-lead EKG  shows normal sinus rhythm with a left axis  deviation and incomplete right  bundle branch block.  There are no acute changes on EKG from prior studies.  She is maintained on her outpatient medication regimen and started on  nebulizers.  The patient ruled out for MI by EKG and cardiac enzymes.  Her  BNP was not elevated during this admission.  She did undergo a 2-D  echocardiogram which is very limited but revealed no evidence of CHF.  They  were not able to obtain ejection fraction from the study due to its limits.   The patient's blood sugars remained under good control during this  hospitalization and she was discharged on her medication regimen as ordered  prior to admission.   The patient's shortness of breath is greatly improved at the time of  discharge.  She does  still have some end expiratory wheezing and therefore  will be discharged home on nebulizers as noted.  She is able to ambulate  without dyspnea and denies any further complaints.   PHYSICAL EXAMINATION:  VITAL SIGNS:  Temperature 98, blood pressure 132/84,  pulse rate 91, respirations 20.  Room air saturations are 99%.   LABORATORY DATA:  Total cholesterol 199, triglycerides 40, HDL 81, LDL 110,  TSH 0.826.  Sodium 138, potassium 3.8, glucose 323, BUN 13, creatinine 1.1.  BNP 42.2.  White blood cell count 9.2, hemoglobin 12.7, hematocrit 38.3,  platelet count 339,000.   CONSULTATIONS:  None.   CONDITION ON DISCHARGE:  Good.   DISPOSITION:  Discharge to home with home health for nebulizer treatments.   FOLLOW UP:  Discharge to home with home health for nebulizer treatments.   The patient is scheduled for follow up with Dr. Jethro Bastos on  July 12, 2003 at 3 p.m.      Ellender Hose. Davis, N.P.                    Melissa L. Ladona Ridgel, MD    SMD/MEDQ  D:  07/06/2003  T:  07/06/2003  Job:  [id number]   cc:   Jethro Bastos, M.D.  [address]  [place]  [place] [address]  Fax: [phone]

## 2011-01-17 ENCOUNTER — Ambulatory Visit (INDEPENDENT_AMBULATORY_CARE_PROVIDER_SITE_OTHER): Payer: Medicare Other | Admitting: Internal Medicine

## 2011-01-17 VITALS — BP 133/75 | HR 77 | Temp 97.9°F | Ht 67.0 in | Wt 219.8 lb

## 2011-01-17 DIAGNOSIS — J45909 Unspecified asthma, uncomplicated: Secondary | ICD-10-CM

## 2011-01-17 DIAGNOSIS — E119 Type 2 diabetes mellitus without complications: Secondary | ICD-10-CM

## 2011-01-17 DIAGNOSIS — I10 Essential (primary) hypertension: Secondary | ICD-10-CM

## 2011-01-17 DIAGNOSIS — K219 Gastro-esophageal reflux disease without esophagitis: Secondary | ICD-10-CM

## 2011-01-17 DIAGNOSIS — J310 Chronic rhinitis: Secondary | ICD-10-CM

## 2011-01-17 LAB — POCT GLYCOSYLATED HEMOGLOBIN (HGB A1C): Hemoglobin A1C: 7.4

## 2011-01-17 LAB — GLUCOSE, CAPILLARY: Glucose-Capillary: 219 mg/dL — ABNORMAL HIGH (ref 70–99)

## 2011-01-17 MED ORDER — HYDROCHLOROTHIAZIDE 25 MG PO TABS: 25.0000 mg | ORAL_TABLET | Freq: Every day | ORAL | Status: DC

## 2011-01-17 MED ORDER — VALSARTAN 160 MG PO TABS: 160.0000 mg | ORAL_TABLET | Freq: Every day | ORAL | Status: DC

## 2011-01-17 MED ORDER — INSULIN LISPRO PROT & LISPRO (75-25 MIX) 100 UNIT/ML ~~LOC~~ SUSP: 24.0000 [IU] | Freq: Every day | SUBCUTANEOUS | Status: DC

## 2011-01-17 MED ORDER — SIMVASTATIN 40 MG PO TABS: 40.0000 mg | ORAL_TABLET | Freq: Every day | ORAL | Status: DC

## 2011-01-17 MED ORDER — METFORMIN HCL 850 MG PO TABS: 850.0000 mg | ORAL_TABLET | Freq: Two times a day (BID) | ORAL | Status: DC

## 2011-01-17 MED ORDER — BUDESONIDE-FORMOTEROL FUMARATE 80-4.5 MCG/ACT IN AERO: 2.0000 | INHALATION_SPRAY | Freq: Two times a day (BID) | RESPIRATORY_TRACT | Status: DC

## 2011-01-17 MED ORDER — CETIRIZINE HCL 10 MG PO CAPS: 10.0000 mg | ORAL_CAPSULE | Freq: Every day | ORAL | Status: DC

## 2011-01-17 MED ORDER — FAMOTIDINE 20 MG PO TABS: 20.0000 mg | ORAL_TABLET | Freq: Two times a day (BID) | ORAL | Status: DC

## 2011-01-17 MED ORDER — CLONAZEPAM 1 MG PO TABS: 2.0000 mg | ORAL_TABLET | Freq: Two times a day (BID) | ORAL | Status: DC | PRN

## 2011-01-17 MED ORDER — FLUTICASONE PROPIONATE 50 MCG/ACT NA SUSP: 1.0000 | Freq: Two times a day (BID) | NASAL | Status: DC

## 2011-01-17 MED ORDER — ALBUTEROL SULFATE HFA 108 (90 BASE) MCG/ACT IN AERS: 2.0000 | INHALATION_SPRAY | Freq: Four times a day (QID) | RESPIRATORY_TRACT | Status: DC | PRN

## 2011-01-17 NOTE — Patient Instructions (Signed)
AT NEXT REFILL YOUR METFORMIN PILL FOR DIABETES WILL BE STRONGER--850 MG TWICE A DAY  KEEP WEARING YOUR ANKLE BRACE ONLY IF IT HURTS TO WALK OR IF IT FEELS UNSTEADY TO WALK  FOLLOW UP WITH DR Meredith Pel IN 3 MONTHS

## 2011-01-17 NOTE — Progress Notes (Signed)
  Subjective:    Patient ID: [name] [name], female    DOB: Mar 22, 1936, 75 y.o.   MRN: [id number]  HPI Here on follow up from emergency room visit for fall-lost her balance and fell-no prodrome, LOC, or vertigo. Twisted her Left ankle. Went to ER  By EMS- CT of Head, C Spine and ankle films all normal. Her CBG also ok by her report.  Larey Seat in March of 2011-similar story, but no other episodes.  Medication list reviwed, updated, and loaded into Epic today by me.     Review of Systems Some ongoing anle tenderness, ow negative    Objective:   Physical Exam  Constitutional: She is oriented to person, place, and time. She appears well-developed and well-nourished.  HENT:  Head: Normocephalic and atraumatic.  Eyes: Conjunctivae are normal.  Neck: Normal range of motion. Neck supple. No thyromegaly present.  Cardiovascular: Normal rate, normal heart sounds and intact distal pulses.   Pulmonary/Chest: Breath sounds normal. She exhibits no tenderness.  Musculoskeletal: Normal range of motion.  Lymphadenopathy:    She has no cervical adenopathy.  Neurological: She is alert and oriented to person, place, and time.      Posterior portion of lat malleolus on left slightly tender and a bit puffy. Good passive ROM Able to bear full weight with a bit of guarding-gait is stable     Assessment & Plan:  Fall after loss of balance, with history not worrisome for any additional etiologies We discussed need for ongoing activity, such as brisk walking, that would in fact reduce her risk of falls from improved conditioning and balance  AIC is 7.4 today, and we are increasing her metformin-which she tolerates-to 850 mg  Continue antihypertensives as previously

## 2011-01-30 ENCOUNTER — Encounter: Payer: Self-pay | Admitting: Internal Medicine

## 2011-02-16 ENCOUNTER — Encounter: Payer: Self-pay | Admitting: Internal Medicine

## 2011-02-19 ENCOUNTER — Ambulatory Visit (INDEPENDENT_AMBULATORY_CARE_PROVIDER_SITE_OTHER): Payer: Medicare Other | Admitting: Internal Medicine

## 2011-02-19 ENCOUNTER — Encounter: Payer: Self-pay | Admitting: Internal Medicine

## 2011-02-19 VITALS — BP 114/60 | HR 82 | Temp 97.7°F | Ht 67.0 in | Wt 221.8 lb

## 2011-02-19 DIAGNOSIS — J45909 Unspecified asthma, uncomplicated: Secondary | ICD-10-CM

## 2011-02-19 MED ORDER — PREDNISONE (PAK) 10 MG PO TABS: ORAL_TABLET | ORAL | Status: AC

## 2011-02-19 NOTE — Progress Notes (Addendum)
Subjective:     Patient ID: [name] [name], female   DOB: [birth year], 75 y.o.   MRN: [id number]  HPI  History of Present Illness:  45 yobf never smoker with known history of " Difficult t-to-control asthma," dating all the way back to the late 1990's typically flares with non-adherence with GERD rx but remains ICS dependent.  April 22, 2009 ov not seen > year doing well on symbicort as needed maybe once daily but once the weather got hot (x sev months) started using symbicort 80 4 puffs in 24 hours and just one albuterol. dyspnea and cough more daytime than night but sometimes it wakes her up at night recently also, no excess mucus.  rec Work on inhaler technique: relax and blow all the way out then take a nice smooth deep breath back in, triggering the inhaler at same time you start breathing in  symbicort 80 2 puffs first thing in am and 2 puffs again in pm about 12 hours later if needed (the second dose is optional)  prevacid 15 mg Take one 30-60 min before first and last meals of the day automatically  June 21, 2009--Returns for follow up and med review. Last visit recommended to add citrucel to regimen however she forgot to go get. Finances play a big role into her meds, she can get generics rx for $5. She was started on prevacid but it is not working. Continues to have upper abd fullness, bloating, and constipation. Her meds were reviewed and pt education was given. no sob or cough  April 07, 2010 Acute visit. Pt c/o increased SOB since the Spring 2011- states that this has been worse x 1 wk with heavy feeling in chest. She gets SOB walking from one room to the next. Heat makes breathing worse. She also c/o dry cough- mainly at bedtime but not waking up once asleep or early in am with resp c/o's . --tx w/ steroid taper.  PAGE 2>>>>>>>>> June 02, 2010 Acute visit. Pt c/o chest tightness and cough x 1 wk- cough is prod with yellow sputum. Has had chills but no fever. She wheezes when lies  down and sometimes with exertion. all this occurred when ran out of ppi assoc with sensation of pnds but no noct awakening or perceived increase need for saba. >>tx steroid taper for exacerbation. and pepcid increased.  July 05, 2010 --Presents for a follow up and med review. She is feeling much better from asthma flare last ov. She has finished her steroid taper. No overt reflux since increasing pepcid two times a day , no longer on PPI. Rare use of Proair since last ov. Her rx coverage is okay , she has not filled rx for diovan or symbicort we sent rx to pharm today to make sure she can afford copay. rec no change in rx  September 20, 2010 ov much worse since ran out of symbicort, didn't know we called it into pharmacy, thinks xopenex twice daily was a reasonable substitute and didn't realize it was already entered on the med cal as a as needed to take in place of proaire, both as needed. Has increase sob, cough and need for saba x several weeks, no purulent sputum.>>no changes, reinforced [facility]  October 31, 2010 --Returns for follow up and med review. Doing well except for tickle in throat w/ drainge also dry cough x 3 days. We reviewd all meds and updated med calendar. Denies chest pain, orthopnea, hemoptysis, fever, n/v/d, edema, headache.  No discolored mucus, no otc used. Worse at night and in am with cough and throat ticlke. Wind makes worse.   02/19/2011 ov/Cniyah Sproull  Cc noct sob, some cough esp w/in a few hours of going to sleep Impression & Recommendations:  Problem # 1: CHRONIC RHINITIS (ICD-472.0)  Meds reviewed with pt education and computerized med calendar completed/adjusted.  May add Zyrtec 10mg  at bedtime as needed for drainage and throat clearing.  Saline nasal rinse as needed for stuffiness  Follow med calendar closely and bring to each visit.  Please contact office for sooner follow up as needed  follow up Dr. Sherene Sires in 3 months  Orders:  Est. Patient Level III (95621)  Medications Added to  Medication List This Visit:  1) Oxygen 2 Liters .... Wear at bedtime  2) Tramadol Hcl 50 Mg Tabs (Tramadol hcl) .Marland Kitchen.. 1 every 4 hours as needed for cough  3) Zyrtec Allergy 10 Mg Tabs (Cetirizine hcl) .... Take 1 tab by mouth at bedtime as needed drainage / throat clearing  4) Ayr Saline Nasal Rinse 1.57 Gm Pack (Sodium chloride-sodium bicarb) .... Use as needed for nasal stuffiness  Physical Exam  Additional Exam: Review of Systems  See HPI  Patient Instructions:  1) May add Zyrtec 10mg  at bedtime as needed for drainage and throat clearing.  2) Saline nasal rinse as needed for stuffiness  3) Follow med calendar closely and bring to each visit.  4) Please contact office for sooner follow up as needed  5) follow up Dr. Sherene Sires in 3 months   Allergies (verified):  1) Asa  2) Tylox  3) Sulfa  4) * Meds With Red Dye  Past History:  Past Surgical History:  Last updated: 06/22/2009  Cholecystectomy  Right rotator cuff repair in 2003  Appendectomy  Hysterectomy  Lumbar laminectomy in 2002. She reports chronic left leg numbness and tingling since then.  Family History:  Last updated: 06/22/2009  Family history of asthma, heart diease, clotting diorders, and cancer  Mother deceased at age 48 due to cancer (unsure what type)  Father deceased at age 36 due to heart disease  Sister had breast cancer in her 22s.  Social History:  Last updated: 06/22/2009  Patient is retired  Married with 2 children  Never smoked  Diabetic  Risk Factors:  Smoking Status: never (09/14/2009)  Past Medical History:  ASTHMA (ICD-493.90)  - HFA 25% June 07, 2009 > 75% April 08, 2010 > 90% June 02, 2010 > 90% September 19, 2010 > 90% 02/19/2011  -overnight ox 10/21 2% O2 <88%, cont on on nocturnal O2  GERD clinical dx  OBESITY  Meds reviewed with pt education and computerized med calendar completed/adjusted. redone June 21, 2009  Diabetes mellitus, type II  Hypertension  Noncritical coronary artery  disease by cardiac catheterization in 1999, with branch vessel coronary artery disease affecting the first obtuse marginal branch.  Complex med regimen--Meds reviewed with pt education and computerized med calendar adjusted October 31, 2010    Review of Systems     Objective:   Physical Exam  wt 222 April 22, 2009 > 224 June 07, 2009 > 218 April 08, 2010 >>221 April 21, 2010 > 224 June 02, 2010 >>222 July 05, 2010 > 221 January 245, 2012 >>217 10/31/10  > 221  02/19/2011  amb hoarse anxious bf nad  HEENT: nl dentition, turbinates, and orophanx. Nl external ear canals without cough reflex  NECK : without JVD/Nodes/TM/ nl carotid upstrokes bilaterally  LUNGS:  no acc muscle use, clear to A and P bilaterally without cough on insp or exp maneuvers  CV: RRR no s3 or murmur or increase in P2, no edema  ABD: soft and nontender with nl excursion in the supine position. No bruits or organomegaly, bowel sounds nl, no guarding or rebound.  MS: warm without deformities, calf tenderness, cyanosis or clubbing     Assessment:         Plan:

## 2011-02-19 NOTE — Patient Instructions (Signed)
Prednisone 10 mg take  4 each am x 2 days,   2 each am x 2 days,  1 each am x2days and stop   Remember if not doing great with breathing then dose of symbicort is Take 2 puffs first thing in am and then another 2 puffs about 12 hours later.   See calendar for specific medication instructions and bring it back for each and every office visit for every healthcare provider you see.  Without it,  you may not receive the best quality medical care that we feel you deserve.  You will note that the calendar groups together  your maintenance  medications that are timed at particular times of the day.  Think of this as your checklist for what your doctor has instructed you to do until your next evaluation to see what benefit  there is  to staying on a consistent group of medications intended to keep you well.  The other group at the bottom is entirely up to you to use as you see fit  for specific symptoms that may arise between visits that require you to treat them on an as needed basis.  Think of this as your action plan or "what if" list.   Separating the top medications from the bottom group is fundamental to providing you adequate care going forward.     If you are satisfied with your treatment plan let your doctor know and he/she can either refill your medications or you can return here when your prescription runs out.     If in any way you are not 100% satisfied,  please tell us.  If 100% better, tell your friends!

## 2011-03-13 ENCOUNTER — Telehealth: Payer: Self-pay | Admitting: Internal Medicine

## 2011-03-13 NOTE — Telephone Encounter (Signed)
We do not have any Diovan samples in the pt strength. I [id number] to advise pt to contact PCP.Carron Curie, CMA

## 2011-03-14 NOTE — Telephone Encounter (Signed)
lmtcb

## 2011-03-15 NOTE — Telephone Encounter (Signed)
lmomtcb  

## 2011-03-16 NOTE — Telephone Encounter (Signed)
[id number].  This is our 4th attempt to contact pt. Per protocol, will sign off on message and wait for pt to return our calls.

## 2011-03-21 ENCOUNTER — Ambulatory Visit: Payer: Medicare Other | Admitting: Internal Medicine

## 2011-05-16 ENCOUNTER — Ambulatory Visit: Payer: Medicare Other | Admitting: Internal Medicine

## 2011-06-01 ENCOUNTER — Ambulatory Visit (INDEPENDENT_AMBULATORY_CARE_PROVIDER_SITE_OTHER): Payer: Medicare Other | Admitting: *Deleted

## 2011-06-01 DIAGNOSIS — Z23 Encounter for immunization: Secondary | ICD-10-CM

## 2011-07-11 ENCOUNTER — Encounter: Payer: Self-pay | Admitting: *Deleted

## 2011-07-11 NOTE — Progress Notes (Deleted)
  Subjective:    Patient ID: [name] [name], female    DOB: [birth year], 75 y.o.   MRN: [id number]  HPI    Review of Systems     Objective:   Physical Exam        Assessment & Plan:

## 2011-07-11 NOTE — Progress Notes (Signed)
I saw patient and examined her ears prior to cerumen removal.  Patient had no ear pain, no tenderness on exam, and no exudate.  I advised patient to schedule a regular followup appointment with me, since she has not been seen in well over a year.

## 2011-07-11 NOTE — Progress Notes (Signed)
Patient accompanied husband to his appointment with Dr. Meredith Pel and complained about not being able hear that good out of right ear.  Ear was irrigated with warm water and peroxide.  Cerumen was removed from right ear.  Pt now states that hearing back to normal.Jelina Paulsen Cassady11/14/20125:00 PM

## 2011-07-13 ENCOUNTER — Encounter: Payer: Self-pay | Admitting: *Deleted

## 2011-07-13 NOTE — Progress Notes (Signed)
  Subjective:    Patient ID: [name] [name], female    DOB: 1936-07-15, 75 y.o.   MRN: [id number]  HPI    Review of Systems     Objective:   Physical Exam        Assessment & Plan:  Patient submitted stool cards for guaiac to [facility] colon cancer study that pt obtained 6/17, 6/19 and 02/15/2011. Study personnel did not develop cards pt submitted and were discovered by RN on 07/13/2011, un tested. This note is to document that pt did submit cards but results cannot be determined due to delay by study personnel. Trixie Dredge, 07/13/2011, 11A

## 2011-09-12 ENCOUNTER — Ambulatory Visit (INDEPENDENT_AMBULATORY_CARE_PROVIDER_SITE_OTHER): Payer: Medicare Other | Admitting: Internal Medicine

## 2011-09-12 ENCOUNTER — Encounter: Payer: Self-pay | Admitting: Internal Medicine

## 2011-09-12 VITALS — BP 137/80 | HR 80 | Temp 97.1°F | Ht 67.0 in | Wt 218.8 lb

## 2011-09-12 DIAGNOSIS — R8281 Pyuria: Secondary | ICD-10-CM

## 2011-09-12 DIAGNOSIS — E119 Type 2 diabetes mellitus without complications: Secondary | ICD-10-CM

## 2011-09-12 DIAGNOSIS — R42 Dizziness and giddiness: Secondary | ICD-10-CM | POA: Insufficient documentation

## 2011-09-12 DIAGNOSIS — I10 Essential (primary) hypertension: Secondary | ICD-10-CM

## 2011-09-12 DIAGNOSIS — E785 Hyperlipidemia, unspecified: Secondary | ICD-10-CM

## 2011-09-12 DIAGNOSIS — Z1211 Encounter for screening for malignant neoplasm of colon: Secondary | ICD-10-CM

## 2011-09-12 LAB — COMPLETE METABOLIC PANEL WITH GFR
ALT: 10 U/L (ref 0–35)
Albumin: 4.2 g/dL (ref 3.5–5.2)
CO2: 32 mEq/L (ref 19–32)
Calcium: 10.3 mg/dL (ref 8.4–10.5)
Chloride: 99 mEq/L (ref 96–112)
Creat: 0.89 mg/dL (ref 0.50–1.10)
GFR, Est African American: 74 mL/min
Potassium: 4.2 mEq/L (ref 3.5–5.3)
Total Protein: 7.4 g/dL (ref 6.0–8.3)

## 2011-09-12 LAB — CBC WITH DIFFERENTIAL/PLATELET
HCT: 43.5 % (ref 36.0–46.0)
Hemoglobin: 13.4 g/dL (ref 12.0–15.0)
Lymphocytes Relative: 34 % (ref 12–46)
Lymphs Abs: 2.7 10*3/uL (ref 0.7–4.0)
Monocytes Absolute: 0.5 10*3/uL (ref 0.1–1.0)
Monocytes Relative: 7 % (ref 3–12)
Neutro Abs: 4.5 10*3/uL (ref 1.7–7.7)
Neutrophils Relative %: 57 % (ref 43–77)
RBC: 4.93 MIL/uL (ref 3.87–5.11)
WBC: 7.9 10*3/uL (ref 4.0–10.5)

## 2011-09-12 LAB — LIPID PANEL
LDL Cholesterol: 58 mg/dL (ref 0–99)
Triglycerides: 92 mg/dL (ref <150)

## 2011-09-12 LAB — GLUCOSE, CAPILLARY: Glucose-Capillary: 143 mg/dL — ABNORMAL HIGH (ref 70–99)

## 2011-09-12 LAB — TSH: TSH: 2.461 u[IU]/mL (ref 0.350–4.500)

## 2011-09-12 NOTE — Assessment & Plan Note (Signed)
Lab Results  Component Value Date   HGBA1C 7.3 09/12/2011     Assessment: Diabetes control: A1c is near goal Progress toward goals: unchanged Barriers to meeting goals: no barriers identified  Plan: Diabetes treatment: continue current medications; patient will follow up with endocrinologist Dr. Evlyn Kanner.  I will request copies of medical records from Dr. Evlyn Kanner.  Will check labs today. Refer to: none Instruction/counseling given: reminded to bring medications to each visit

## 2011-09-12 NOTE — Patient Instructions (Signed)
Please bring all of your medications in to clinic within one week so we can update your medication list; the list shown on this summary is not up to date. Please complete and return the Hemoccult cards as instructed.

## 2011-09-12 NOTE — Assessment & Plan Note (Signed)
Patient was on simvastatin in the past, but she did not bring her medications so I could not confirm that she is still on simvastatin.  She will bring her medications for review.  Will check a metabolic panel and lipid panel today.

## 2011-09-12 NOTE — Assessment & Plan Note (Signed)
Patient reports brief episodes of dizziness that occur mainly when she is lying down, with some associated bilateral decrease in hearing.  She reports a remote history of bilateral ear surgery, which she describes as having "pins" placed in her ears surgically.  Exam is notable for bilateral cerumen partially obscuring her tympanic membranes.  Plan is ENT referral; I will try to obtain records of her past ear surgery, but these may not be available.

## 2011-09-12 NOTE — Progress Notes (Signed)
  Subjective:    Patient ID: [name] [name], female    DOB: 1935-09-01, 76 y.o.   MRN: [id number]  HPI Patient returns for followup of her diabetes mellitus, hypertension, hyperlipidemia, and other chronic medical problems; her last visit to see me was in January of 2011.  She reports that she still sees her endocrinologist Dr. Evlyn Kanner for management of her diabetes; she apparently saw her pulmonologist Dr. Sherene Sires about 7 months ago.  Her only complaint today is occasional dizziness that occurs when she lies down; the episodes last for a few seconds and then resolve.  She also reports some decrease in her hearing bilaterally.  She denies vertigo or ear pain.  She checks her blood sugar twice a day.  She has home oxygen, but says she has does not use it regularly.  She did not bring her medications to clinic today, so I could not reconcile her medication list.  She agreed to bring her medications in to clinic within one week so we can review them and update her medication list.  Review of Systems  Constitutional: Negative for fever, chills and diaphoresis.  Respiratory: Positive for shortness of breath (Dyspnea with exertion, relieved by rest; chronic and stable.) and wheezing (Occasional, none recently.). Negative for cough.   Cardiovascular: Negative for chest pain and leg swelling.  Gastrointestinal: Positive for constipation (Has resumed stool softeners.). Negative for nausea, vomiting, abdominal pain and blood in stool.  Genitourinary: Negative for dysuria and frequency.  Musculoskeletal: Positive for arthralgias (Right knee pain, chronic). Negative for myalgias.  Psychiatric/Behavioral: Negative for dysphoric mood. The patient is not nervous/anxious.        Objective:   Physical Exam  Constitutional: No distress.  HENT:       Tympanic membranes are largely obscured by cerumen bilaterally; there is no exudate; there is no pain on manipulation of her external ear.  Cardiovascular: Normal  rate, regular rhythm and normal heart sounds.  Exam reveals no gallop and no friction rub.   No murmur heard. Pulmonary/Chest: Effort normal and breath sounds normal. No respiratory distress. She has no wheezes. She has no rales.  Abdominal: Bowel sounds are normal. She exhibits no distension. There is no tenderness.  Musculoskeletal: She exhibits no edema.     Hemoccult cards with instructions were provided today.    Assessment & Plan:

## 2011-09-12 NOTE — Assessment & Plan Note (Signed)
  BP Readings from Last 3 Encounters:  09/12/11 137/80  02/19/11 114/60  01/17/11 133/75    Assessment: Hypertension control:  controlled  Progress toward goals:  at goal Barriers to meeting goals:  Patient reports that she is unable to afford her Diovan, and has been out of this for 2 days  Plan: Hypertension treatment:  continue current medications; check labs today; request office records from Dr. Evlyn Kanner.

## 2011-09-13 ENCOUNTER — Encounter: Payer: Self-pay | Admitting: Internal Medicine

## 2011-09-13 DIAGNOSIS — R8281 Pyuria: Secondary | ICD-10-CM | POA: Insufficient documentation

## 2011-09-13 LAB — URINALYSIS, MICROSCOPIC ONLY

## 2011-09-13 LAB — URINALYSIS, ROUTINE W REFLEX MICROSCOPIC
Glucose, UA: NEGATIVE mg/dL
Nitrite: NEGATIVE
pH: 5.5 (ref 5.0–8.0)

## 2011-09-13 LAB — MICROALBUMIN / CREATININE URINE RATIO
Microalb Creat Ratio: 16.4 mg/g (ref 0.0–30.0)
Microalb, Ur: 3.33 mg/dL — ABNORMAL HIGH (ref 0.00–1.89)

## 2011-09-13 NOTE — Progress Notes (Signed)
Addended by: Margarito Liner on: 09/13/2011 09:13 AM   Modules accepted: Orders

## 2011-09-13 NOTE — Assessment & Plan Note (Signed)
Routine urinalysis done 09/12/2011 showed pyuria; patient was asymptomatic.  Plan is to repeat urinalysis and culture urine.

## 2011-09-13 NOTE — Progress Notes (Signed)
Routine urinalysis done 09/12/2011 showed pyuria; patient was asymptomatic.  Plan is to repeat urinalysis and culture urine. 

## 2011-10-08 ENCOUNTER — Encounter: Payer: Self-pay | Admitting: Internal Medicine

## 2011-11-30 ENCOUNTER — Other Ambulatory Visit: Payer: Self-pay | Admitting: Endocrinology

## 2011-11-30 DIAGNOSIS — Z1231 Encounter for screening mammogram for malignant neoplasm of breast: Secondary | ICD-10-CM

## 2011-12-28 ENCOUNTER — Ambulatory Visit: Payer: Medicare Other

## 2011-12-28 ENCOUNTER — Ambulatory Visit
Admission: RE | Admit: 2011-12-28 | Discharge: 2011-12-28 | Disposition: A | Discharge status: Home / Self Care | Payer: Medicare Other | Source: Ambulatory Visit | Attending: Endocrinology | Admitting: Endocrinology

## 2011-12-28 DIAGNOSIS — Z1231 Encounter for screening mammogram for malignant neoplasm of breast: Secondary | ICD-10-CM

## 2012-01-01 ENCOUNTER — Other Ambulatory Visit: Payer: Self-pay | Admitting: Endocrinology

## 2012-01-01 DIAGNOSIS — R928 Other abnormal and inconclusive findings on diagnostic imaging of breast: Secondary | ICD-10-CM

## 2012-01-02 ENCOUNTER — Ambulatory Visit
Admission: RE | Admit: 2012-01-02 | Discharge: 2012-01-02 | Disposition: A | Discharge status: Home / Self Care | Payer: Medicare Other | Source: Ambulatory Visit | Attending: Endocrinology | Admitting: Endocrinology

## 2012-01-02 DIAGNOSIS — R928 Other abnormal and inconclusive findings on diagnostic imaging of breast: Secondary | ICD-10-CM

## 2012-02-04 ENCOUNTER — Encounter (HOSPITAL_COMMUNITY): Payer: Self-pay | Admitting: Emergency Medicine

## 2012-02-04 ENCOUNTER — Emergency Department (HOSPITAL_COMMUNITY)
Admission: EM | Admit: 2012-02-04 | Discharge: 2012-02-04 | Disposition: A | Discharge status: Home / Self Care | Payer: Medicare Other | Attending: Emergency Medicine | Admitting: Emergency Medicine

## 2012-02-04 DIAGNOSIS — M545 Low back pain, unspecified: Secondary | ICD-10-CM | POA: Insufficient documentation

## 2012-02-04 DIAGNOSIS — M549 Dorsalgia, unspecified: Secondary | ICD-10-CM

## 2012-02-04 DIAGNOSIS — I2510 Atherosclerotic heart disease of native coronary artery without angina pectoris: Secondary | ICD-10-CM | POA: Insufficient documentation

## 2012-02-04 DIAGNOSIS — Z794 Long term (current) use of insulin: Secondary | ICD-10-CM | POA: Insufficient documentation

## 2012-02-04 DIAGNOSIS — J45909 Unspecified asthma, uncomplicated: Secondary | ICD-10-CM | POA: Insufficient documentation

## 2012-02-04 DIAGNOSIS — N390 Urinary tract infection, site not specified: Secondary | ICD-10-CM

## 2012-02-04 DIAGNOSIS — E119 Type 2 diabetes mellitus without complications: Secondary | ICD-10-CM | POA: Insufficient documentation

## 2012-02-04 DIAGNOSIS — I10 Essential (primary) hypertension: Secondary | ICD-10-CM | POA: Insufficient documentation

## 2012-02-04 DIAGNOSIS — K219 Gastro-esophageal reflux disease without esophagitis: Secondary | ICD-10-CM | POA: Insufficient documentation

## 2012-02-04 LAB — POCT I-STAT, CHEM 8
Calcium, Ion: 1.29 mmol/L (ref 1.12–1.32)
Creatinine, Ser: 1 mg/dL (ref 0.50–1.10)
Glucose, Bld: 114 mg/dL — ABNORMAL HIGH (ref 70–99)
Hemoglobin: 13.6 g/dL (ref 12.0–15.0)
Sodium: 141 mEq/L (ref 135–145)
TCO2: 30 mmol/L (ref 0–100)

## 2012-02-04 LAB — URINALYSIS, ROUTINE W REFLEX MICROSCOPIC
Bilirubin Urine: NEGATIVE
Hgb urine dipstick: NEGATIVE
Ketones, ur: NEGATIVE mg/dL
Protein, ur: NEGATIVE mg/dL
Urobilinogen, UA: 0.2 mg/dL (ref 0.0–1.0)

## 2012-02-04 MED ORDER — HYDROCODONE-ACETAMINOPHEN 5-325 MG PO TABS
1.0000 | ORAL_TABLET | Freq: Once | ORAL | Status: AC
  Administered 2012-02-04: 1 via ORAL
  Filled 2012-02-04: qty 1

## 2012-02-04 MED ORDER — DIAZEPAM 5 MG PO TABS: 5.0000 mg | ORAL_TABLET | Freq: Three times a day (TID) | ORAL | Status: AC | PRN

## 2012-02-04 MED ORDER — HYDROCODONE-ACETAMINOPHEN 5-325 MG PO TABS: 1.0000 | ORAL_TABLET | ORAL | Status: AC | PRN

## 2012-02-04 MED ORDER — DIAZEPAM 5 MG PO TABS
5.0000 mg | ORAL_TABLET | Freq: Once | ORAL | Status: AC
  Administered 2012-02-04: 5 mg via ORAL
  Filled 2012-02-04: qty 1

## 2012-02-04 MED ORDER — CIPROFLOXACIN HCL 250 MG PO TABS: 250.0000 mg | ORAL_TABLET | Freq: Two times a day (BID) | ORAL | Status: AC

## 2012-02-04 NOTE — ED Provider Notes (Signed)
2:42 PM Pt to move to CDU for back pain protocol.  Sign out received from Langley Adie, PA-C.  Per Melvenia Beam, she and Dr Anitra Lauth feel that patient's pain is musculoskeletal.  She is having pain with ambulation.  However, pt is ambulatory.  Lives at home alone.  Plan is for patient to have PO pain medications in CDU until she is more comfortable with d/c home.  PCP follow up.    3:21 PM Discussed patient with Lorenz Coaster, PA-C, who assumes care of patient at change of shift.    Dillard Cannon [place], [place] 02/04/12 [phone]

## 2012-02-04 NOTE — ED Provider Notes (Signed)
3:30 PM Pt care assumed at change of shift. Report received from PA [place]. Elderly female with remote hx back surgery and chronic, intermittent left-sided back pain, to ED today with approx 1 week worsening right lower back pain. Felt to be MSK in origin, reproducible, no neuro/motor deficit. Ambulatory with pain. On back pain protocol, second dose of pain medication recently administered.  Pt is alert and oriented, lying on stretcher in NAD. Lungs CTAB. Heart RRR. Abd s/nt. TTP right parathoracic and paralumbar regions, mild. MAEW. Able to hold bilateral legs in air for >5seconds against resistance.  Will continue to monitor with hope to d/c home later this evening once pain is under good control.     6:01 PM Pt with sig improvement in pain. Still with good strength and movement to all extremities. Will d.c home with rx that she has tolerated well in ED. Return precautions discussed. Will f/u with PCP.  Shaaron Adler, [place] 02/04/12 1802

## 2012-02-04 NOTE — ED Notes (Signed)
Pt. Updated on d/c plans. Pt. More comfortable prior to coming here. PA-C made aware.

## 2012-02-04 NOTE — ED Notes (Signed)
Patient advised urine sample needed.  

## 2012-02-04 NOTE — Discharge Instructions (Signed)
Back Pain, Adult Low back pain is very common. About 1 in 5 people have back pain.The cause of low back pain is rarely dangerous. The pain often gets better over time.About half of people with a sudden onset of back pain feel better in just 2 weeks. About 8 in 10 people feel better by 6 weeks.  CAUSES Some common causes of back pain include:  Strain of the muscles or ligaments supporting the spine.   Wear and tear (degeneration) of the spinal discs.   Arthritis.   Direct injury to the back.  DIAGNOSIS Most of the time, the direct cause of low back pain is not known.However, back pain can be treated effectively even when the exact cause of the pain is unknown.Answering your caregiver's questions about your overall health and symptoms is one of the most accurate ways to make sure the cause of your pain is not dangerous. If your caregiver needs more information, he or she may order lab work or imaging tests (X-rays or MRIs).However, even if imaging tests show changes in your back, this usually does not require surgery. HOME CARE INSTRUCTIONS For many people, back pain returns.Since low back pain is rarely dangerous, it is often a condition that people can learn to manageon their own.   Remain active. It is stressful on the back to sit or stand in one place. Do not sit, drive, or stand in one place for more than 30 minutes at a time. Take short walks on level surfaces as soon as pain allows.Try to increase the length of time you walk each day.   Do not stay in bed.Resting more than 1 or 2 days can delay your recovery.   Do not avoid exercise or work.Your body is made to move.It is not dangerous to be active, even though your back may hurt.Your back will likely heal faster if you return to being active before your pain is gone.   Pay attention to your body when you bend and lift. Many people have less discomfortwhen lifting if they bend their knees, keep the load close to their  bodies,and avoid twisting. Often, the most comfortable positions are those that put less stress on your recovering back.   Find a comfortable position to sleep. Use a firm mattress and lie on your side with your knees slightly bent. If you lie on your back, put a pillow under your knees.   Only take over-the-counter or prescription medicines as directed by your caregiver. Over-the-counter medicines to reduce pain and inflammation are often the most helpful.Your caregiver may prescribe muscle relaxant drugs.These medicines help dull your pain so you can more quickly return to your normal activities and healthy exercise.   Put ice on the injured area.   Put ice in a plastic bag.   Place a towel between your skin and the bag.   Leave the ice on for 15 to 20 minutes, 3 to 4 times a day for the first 2 to 3 days. After that, ice and heat may be alternated to reduce pain and spasms.   Ask your caregiver about trying back exercises and gentle massage. This may be of some benefit.   Avoid feeling anxious or stressed.Stress increases muscle tension and can worsen back pain.It is important to recognize when you are anxious or stressed and learn ways to manage it.Exercise is a great option.  SEEK MEDICAL CARE IF:  You have pain that is not relieved with rest or medicine.   You have   relieved with rest or medicine.   You have pain that does not improve in 1 week.   You have new symptoms.   You are generally not feeling well.  SEEK IMMEDIATE MEDICAL CARE IF:    You have pain that radiates from your back into your legs.   You develop new bowel or bladder control problems.   You have unusual weakness or numbness in your arms or legs.   You develop nausea or vomiting.   You develop abdominal pain.   You feel faint.  Document Released: 08/13/2005 Document Revised: 08/02/2011 Document Reviewed: 01/01/2011  ExitCare Patient Information 2012 [employer]  Urinary Tract Infection  Infections of the urinary tract can start in several places. A bladder infection  (cystitis), a kidney infection (pyelonephritis), and a prostate infection (prostatitis) are different types of urinary tract infections (UTIs). They usually get better if treated with medicines (antibiotics) that kill germs. Take all the medicine until it is gone. You or your child may feel better in a few days, but TAKE ALL MEDICINE or the infection may not respond and may become more difficult to treat.  HOME CARE INSTRUCTIONS    Drink enough water and fluids to keep the urine clear or pale yellow. Cranberry juice is especially recommended, in addition to large amounts of water.   Avoid caffeine, tea, and carbonated beverages. They tend to irritate the bladder.   Alcohol may irritate the prostate.   Only take over-the-counter or prescription medicines for pain, discomfort, or fever as directed by your caregiver.  To prevent further infections:   Empty the bladder often. Avoid holding urine for long periods of time.   After a bowel movement, women should cleanse from front to back. Use each tissue only once.   Empty the bladder before and after sexual intercourse.  FINDING OUT THE RESULTS OF YOUR TEST  Not all test results are available during your visit. If your or your child's test results are not back during the visit, make an appointment with your caregiver to find out the results. Do not assume everything is normal if you have not heard from your caregiver or the medical facility. It is important for you to follow up on all test results.  SEEK MEDICAL CARE IF:    There is back pain.   Your baby is older than 3 months with a rectal temperature of 100.5 F (38.1 C) or higher for more than 1 day.   Your or your child's problems (symptoms) are no better in 3 days. Return sooner if you or your child is getting worse.  SEEK IMMEDIATE MEDICAL CARE IF:    There is severe back pain or lower abdominal pain.   You or your child develops chills.   You have a fever.   Your baby is older than 3 months with a  rectal temperature of 102 F (38.9 C) or higher.   Your baby is 3 months old or younger with a rectal temperature of 100.4 F (38 C) or higher.   There is nausea or vomiting.   There is continued burning or discomfort with urination.  MAKE SURE YOU:    Understand these instructions.   Will watch your condition.   Will get help right away if you are not doing well or get worse.  Document Released: 05/23/2005 Document Revised: 08/02/2011 Document Reviewed: 12/26/2006  Exit[employer] Patient Information 2012 [employer].

## 2012-02-04 NOTE — ED Notes (Signed)
Meal tray ordered 

## 2012-02-04 NOTE — ED Notes (Signed)
Lower back pain s 3-4 days and hurts to move head rt side and that rads to lower back

## 2012-02-04 NOTE — ED Provider Notes (Signed)
History     CSN: [id number]  Arrival date & time 02/04/12  9323   First MD Initiated Contact with Patient 02/04/12 [phone]      Chief Complaint  Patient presents with  . Back Pain    (Consider location/radiation/quality/duration/timing/severity/associated sxs/prior treatment) Patient is a 76 y.o. female presenting with back pain. The history is provided by the patient.  Back Pain  This is a new problem. The current episode started more than 2 days ago. The problem occurs constantly. The problem has not changed since onset.The pain is associated with no known injury. Pertinent negatives include no chest pain, no fever, no numbness, no abdominal pain, no dysuria and no weakness. Associated symptoms comments: Right sided lower back pain for 5 days, worse when she flexes cervical spine. No cervical pain, soreness, injury. She denies dysuria, urinary frequency, abdominal pain, or nausea. No fever. She has a history of left sciatica which is different from today's complaint. .    Past Medical History  Diagnosis Date  . Asthma   . GERD (gastroesophageal reflux disease)   . Hypertension   . CAD (coronary artery disease)   . Obesity   . Diabetes mellitus     Past Surgical History  Procedure Date  . Cholecystectomy   . Appendectomy   . Abdominal hysterectomy   . Rotator cuff repair 2003    Right  . Lumbar laminectomy 2002    Family History  Problem Relation Age of Onset  . Cancer Mother     Question pancreatic  . Coronary artery disease Father   . Breast cancer Sister 52  . Coronary artery disease Sister 74  . Colon cancer Neg Hx     History  Substance Use Topics  . Smoking status: Never Smoker   . Smokeless tobacco: Never Used  . Alcohol Use: No    OB History    Grav Para Term Preterm Abortions TAB SAB Ect Mult Living                  Review of Systems  Constitutional: Negative for fever.  HENT: Negative for neck pain.   Respiratory: Negative for cough.     Cardiovascular: Negative for chest pain.  Gastrointestinal: Negative for nausea and abdominal pain.  Genitourinary: Negative for dysuria and frequency.  Musculoskeletal: Positive for back pain.  Neurological: Negative for weakness and numbness.    Allergies  Aspirin; Oxycodone-acetaminophen; and Sulfonamide derivatives  Home Medications   Current Outpatient Rx  Name Route Sig Dispense Refill  . ALBUTEROL SULFATE HFA 108 (90 BASE) MCG/ACT IN AERS Inhalation Inhale 2 puffs into the lungs every 6 (six) hours as needed for wheezing. 1 Inhaler 11  . BUDESONIDE-FORMOTEROL FUMARATE 80-4.5 MCG/ACT IN AERO Inhalation Inhale 2 puffs into the lungs 2 (two) times daily. 1 Inhaler 12  . CETIRIZINE HCL 10 MG PO CAPS Oral Take 1 capsule (10 mg total) by mouth daily. 30 capsule 11  . FAMOTIDINE 20 MG PO TABS Oral Take 1 tablet (20 mg total) by mouth 2 (two) times daily. 30 tablet 11  . FLUTICASONE PROPIONATE 50 MCG/ACT NA SUSP Nasal 1 spray by Nasal route 2 (two) times daily. 16 g 11  . HYDROCHLOROTHIAZIDE 25 MG PO TABS Oral Take 1 tablet (25 mg total) by mouth daily. 90 tablet 3  . INSULIN LISPRO PROT & LISPRO (75-25) 100 UNIT/ML [place] SUSP Subcutaneous Inject 24 Units into the skin daily at 8 pm. 10 mL 12  . METFORMIN HCL 850 MG  PO TABS Oral Take 1 tablet (850 mg total) by mouth 2 (two) times daily with a meal. 60 tablet 11  . POLYETHYLENE GLYCOL 3350 PO POWD Oral Take 17 g by mouth 2 (two) times daily as needed. 1 capful     . SIMETHICONE 125 MG PO CAPS Oral Take by mouth. Take with meals     . TRAMADOL HCL 50 MG PO TABS Oral Take 50 mg by mouth every 4 (four) hours as needed. 1-2 every 4 hours for cough     . VITAMIN D (ERGOCALCIFEROL) 50000 UNITS PO CAPS Oral Take 50,000 Units by mouth. 1 capsule every Tuesday and Friday       BP 106/50  Pulse 78  Temp(Src) 98.6 F (37 C) (Oral)  Resp 18  SpO2 100%  Physical Exam  Constitutional: She is oriented to person, place, and time. She appears  well-developed and well-nourished. No distress.  Neck: Normal range of motion.  Pulmonary/Chest: Effort normal.  Abdominal: Soft. She exhibits no mass. There is no tenderness. There is no rebound and no guarding.  Musculoskeletal:       Right sided parathoracic tenderness that extends to flank. No swelling, discoloration. No rash.  Neurological: She is alert and oriented to person, place, and time.  Skin: Skin is warm and dry.    ED Course  Procedures (including critical care time)  Labs Reviewed - No data to display No results found. Results for orders placed during the hospital encounter of 02/04/12  URINALYSIS, ROUTINE W REFLEX MICROSCOPIC      Component Value Range   Color, Urine YELLOW  YELLOW    APPearance CLEAR  CLEAR    Specific Gravity, Urine 1.006  1.005 - 1.030    pH 6.0  5.0 - 8.0    Glucose, UA NEGATIVE  NEGATIVE (mg/dL)   Hgb urine dipstick NEGATIVE  NEGATIVE    Bilirubin Urine NEGATIVE  NEGATIVE    Ketones, ur NEGATIVE  NEGATIVE (mg/dL)   Protein, ur NEGATIVE  NEGATIVE (mg/dL)   Urobilinogen, UA 0.2  0.0 - 1.0 (mg/dL)   Nitrite NEGATIVE  NEGATIVE    Leukocytes, UA LARGE (*) NEGATIVE   POCT I-STAT, CHEM 8      Component Value Range   Sodium 141  135 - 145 (mEq/L)   Potassium 3.6  3.5 - 5.1 (mEq/L)   Chloride 100  96 - 112 (mEq/L)   BUN 12  6 - 23 (mg/dL)   Creatinine, Ser 4.54  0.50 - 1.10 (mg/dL)   Glucose, Bld 098 (*) 70 - 99 (mg/dL)   Calcium, Ion 1.19  1.47 - 1.32 (mmol/L)   TCO2 30  0 - 100 (mmol/L)   Hemoglobin 13.6  12.0 - 15.0 (g/dL)   HCT 82.9  56.2 - 13.0 (%)  URINE MICROSCOPIC-ADD ON      Component Value Range   Squamous Epithelial / LPF FEW (*) RARE    WBC, UA 11-20  <3 (WBC/hpf)   RBC / HPF 0-2  <3 (RBC/hpf)   Bacteria, UA RARE  RARE      No diagnosis found.  1. Back pain, muscular   MDM  Feel the pain in back follows a musculoskeletal pattern - is reproducible, worse with movement, without urinary symptoms, afebrile. She is not  comfortable with oral medication x 1 and finds it difficult to ambulate due to pain. Lives alone. Offered back pain protocol to observe for improvement, which patient would like to do as she does not feel  she can be discharged home at this time. Additional pain medication ordered. Move to CDU. Discussed with Trixie Dredge, PA-C.        Rodena Medin, PA-C 02/04/12 1448

## 2012-02-04 NOTE — ED Notes (Signed)
Patient being transported to CDU 07

## 2012-02-04 NOTE — ED Notes (Signed)
Patient states she started having pain in her lower back and radiates to her right side and when she moves her hear the pain radiates down to right side lower back x 4 days. Patient denies any pain or burning with urination. Patient denies chest pain and states she has pressure in her chest and epigastric area. Patient placed on monitor and sats of 99% RA.

## 2012-02-05 LAB — URINE CULTURE
Culture  Setup Time: 201306101601
Culture: NO GROWTH

## 2012-02-05 NOTE — ED Provider Notes (Signed)
Medical screening examination/treatment/procedure(s) were conducted as a shared visit with non-physician practitioner(s) and myself.  I personally evaluated the patient during the encounter   Gwyneth Sprout, MD 02/05/12 2201

## 2012-02-05 NOTE — ED Provider Notes (Signed)
Medical screening examination/treatment/procedure(s) were conducted as a shared visit with non-physician practitioner(s) and myself.  I personally evaluated the patient during the encounter   Gwyneth Sprout, MD 02/05/12 2159

## 2012-02-05 NOTE — ED Provider Notes (Signed)
Medical screening examination/treatment/procedure(s) were conducted as a shared visit with non-physician practitioner(s) and myself.  I personally evaluated the patient during the encounter  Pt with worsening lumbar pain that radiates to the right side but no leg invovlement.  Worse with movement.  10/10 pain. Gen: uncomforable with movement HEENT: normocephalic ataumatic.  Mucous membranes are moist CV: RRR, no MRG Resp: CTAB ABD: nontender, no rebound or guarding. MSK: tenderness with palpation just proximal to l=spine scar and pain in the right paralumbar muscles.  Normal femoral, DP and PT pulses.  Pt placed on back pain protocol.  Low concern for GI etiology and unlikely AAA given normal BP and no abd pain and normal pulses.  Gwyneth Sprout, MD 02/05/12 2157

## 2012-03-05 ENCOUNTER — Ambulatory Visit: Payer: Medicare Other | Admitting: Internal Medicine

## 2012-03-12 ENCOUNTER — Ambulatory Visit: Payer: Medicare Other | Admitting: Internal Medicine

## 2012-03-19 LAB — LIPID PANEL
HDL: 64 mg/dL (ref 35–70)
LDL Cholesterol: 68 mg/dL
Triglycerides: 57 mg/dL (ref 40–160)

## 2012-03-19 LAB — HEPATIC FUNCTION PANEL: Bilirubin, Direct: 0.2 mg/dL (ref 0.01–0.4)

## 2012-03-19 LAB — BASIC METABOLIC PANEL
Potassium: 3.9 mmol/L (ref 3.4–5.3)
Sodium: 142 mmol/L (ref 137–147)

## 2012-03-19 LAB — HEMOGLOBIN A1C: Hgb A1c MFr Bld: 7 % — AB (ref 4.0–6.0)

## 2012-03-19 LAB — TSH: TSH: 1.96 u[IU]/mL (ref 0.41–5.90)

## 2012-03-26 ENCOUNTER — Ambulatory Visit (HOSPITAL_COMMUNITY)
Admission: RE | Admit: 2012-03-26 | Discharge: 2012-03-26 | Disposition: A | Discharge status: Home / Self Care | Payer: Medicare Other | Source: Ambulatory Visit | Attending: Internal Medicine | Admitting: Internal Medicine

## 2012-03-26 ENCOUNTER — Encounter: Payer: Self-pay | Admitting: Internal Medicine

## 2012-03-26 ENCOUNTER — Ambulatory Visit (INDEPENDENT_AMBULATORY_CARE_PROVIDER_SITE_OTHER): Payer: Medicare Other | Admitting: Internal Medicine

## 2012-03-26 VITALS — BP 127/70 | HR 76 | Temp 97.2°F | Ht 67.0 in | Wt 213.6 lb

## 2012-03-26 DIAGNOSIS — I10 Essential (primary) hypertension: Secondary | ICD-10-CM

## 2012-03-26 DIAGNOSIS — M47812 Spondylosis without myelopathy or radiculopathy, cervical region: Secondary | ICD-10-CM | POA: Insufficient documentation

## 2012-03-26 DIAGNOSIS — E119 Type 2 diabetes mellitus without complications: Secondary | ICD-10-CM

## 2012-03-26 DIAGNOSIS — M5412 Radiculopathy, cervical region: Secondary | ICD-10-CM

## 2012-03-26 DIAGNOSIS — M542 Cervicalgia: Secondary | ICD-10-CM | POA: Insufficient documentation

## 2012-03-26 DIAGNOSIS — E785 Hyperlipidemia, unspecified: Secondary | ICD-10-CM

## 2012-03-26 DIAGNOSIS — K219 Gastro-esophageal reflux disease without esophagitis: Secondary | ICD-10-CM

## 2012-03-26 DIAGNOSIS — M4802 Spinal stenosis, cervical region: Secondary | ICD-10-CM | POA: Insufficient documentation

## 2012-03-26 DIAGNOSIS — J45909 Unspecified asthma, uncomplicated: Secondary | ICD-10-CM

## 2012-03-26 DIAGNOSIS — N390 Urinary tract infection, site not specified: Secondary | ICD-10-CM | POA: Insufficient documentation

## 2012-03-26 DIAGNOSIS — M5020 Other cervical disc displacement, unspecified cervical region: Secondary | ICD-10-CM | POA: Insufficient documentation

## 2012-03-26 HISTORY — DX: Spondylosis without myelopathy or radiculopathy, cervical region: M47.812

## 2012-03-26 MED ORDER — OMEPRAZOLE 20 MG PO CPDR: 20.0000 mg | DELAYED_RELEASE_CAPSULE | Freq: Every day | ORAL | Status: DC

## 2012-03-26 MED ORDER — ALBUTEROL SULFATE HFA 108 (90 BASE) MCG/ACT IN AERS: 2.0000 | INHALATION_SPRAY | Freq: Four times a day (QID) | RESPIRATORY_TRACT | Status: DC | PRN

## 2012-03-26 NOTE — Assessment & Plan Note (Signed)
Lipids:    Component Value Date/Time   CHOL 143 03/19/2012   TRIG 57 03/19/2012   HDL 64 03/19/2012   LDLCALC 68 03/19/2012    Assessment: Recent lipid panel drawn by Dr. Evlyn Kanner shows excellent control of her hyperlipidemia on current regimen of simvastatin 40 mg daily.  She has no apparent side effects of the statin medication.  Plan: Continue simvastatin 40 mg daily.

## 2012-03-26 NOTE — Assessment & Plan Note (Signed)
Assessment: Patient appears to be clinically stable and doing well.  She seems somewhat confused about her inhaler regimen, and appears to have been using a steroid inhaler (Symbicort or Qvar) on an as-needed basis, and not to have been using her albuterol inhaler at all.  Plan: I discussed at length with patient the appropriate use of the albuterol inhaler as a rescue inhaler, and I refilled the albuterol inhaler.  I'm not sure which inhaled steroid she has been using as needed, and I asked her to bring in all of her medications including her inhalers to her next clinic visit so I can review and confirm her medication list.

## 2012-03-26 NOTE — Assessment & Plan Note (Signed)
Basic Metabolic Panel:    Component Value Date/Time   NA 142 03/19/2012   K 3.9 03/19/2012   BUN 18 03/19/2012   CREATININE 0.9 03/19/2012    BP Readings from Last 3 Encounters:  03/26/12 127/70  02/04/12 130/57  09/12/11 137/80    Assessment: Hypertension control:  controlled  Progress toward goals:  at goal Barriers to meeting goals:  no barriers identified  Plan: Hypertension treatment:  continue current regimen of valsartan-hydrochlorothiazide 160-12.5 mg daily.

## 2012-03-26 NOTE — Assessment & Plan Note (Signed)
Assessment: Patient's symptoms and exam are consistent with cervical radiculopathy, possibly due to cervical spondylosis or an intervertebral disc problem.  Patient has no warning signs of neurologic compromise.  Plan: Cervical spine x-rays; conservative treatment by continuing her current outpatient management of Aleve 2 tablets daily for pain management supplemented by over-the-counter acetaminophen.  Patient will return in 2 weeks for reassessment; if her pain has improved at that time, then the plan will be to refer to physical therapy.  If her pain has not improved, consider a brief prednisone taper.  I discussed at length with her the management of cervical radiculopathy, and instructed her to return immediately if she develops worsening pain or any new neurologic symptoms.

## 2012-03-26 NOTE — Patient Instructions (Signed)
Continue over-the-counter Aleve as needed for pain. Start omeprazole (Prilosec) 20 mg daily for acid reflux. Please make an appointment with your eye doctor for your annual eye exam.

## 2012-03-26 NOTE — Progress Notes (Signed)
  Subjective:    Patient ID: [name] [name], female    DOB: [birth year], 76 y.o.   MRN: [id number]  HPI Patient returns for followup of her diabetes mellitus, hypertension, and other chronic medical problems.  Today her main complaint is neck pain which radiates down her left shoulder and left upper extremity to her hand, with associated subjective numbness and tingling of the hand.  The pain started about 3 weeks ago.  She denies subjective weakness but reports that she has occasionally dropped objects.  She denies any neck injury.  Patient reports that she recently saw her endocrinologist Dr. Evlyn Kanner, and she is now on insulin that was previous started by Dr. Evlyn Kanner.  She also reports an episode of back pain last month that prompted an ED visit; she was diagnosed with a urinary tract infection, and says that her back pain resolved following treatment with an antibiotic.  She was apparently recently retreated with a course of ciprofloxacin which she is currently completing.  She did not bring her medications to clinic today; she appears to have been using a steroid inhaler rather than albuterol on an as-needed basis, and she reports that she has not been using albuterol at all.  She also reports that she is not taking citalopram and that she has not taken that medication for a long time.  Review of Systems  Constitutional: Negative for fever and unexpected weight change.  Respiratory: Negative for cough and wheezing.   Cardiovascular: Negative for chest pain and leg swelling.  Gastrointestinal: Negative for nausea, vomiting, abdominal pain and blood in stool.  Genitourinary: Negative for dysuria and frequency.       Objective:   Physical Exam  Constitutional: No distress.  Cardiovascular: Normal rate, regular rhythm and normal heart sounds.  Exam reveals no gallop and no friction rub.   No murmur heard. Pulmonary/Chest: Effort normal and breath sounds normal. No respiratory distress. She has no  wheezes. She has no rales.  Abdominal: Soft. Bowel sounds are normal. She exhibits no distension. There is no hepatosplenomegaly. There is no tenderness. There is no rebound and no guarding.  Musculoskeletal: Full range of motion of left upper extremity, with no pain on abduction, internal rotation, or external rotation; no pain on neck flexion; negative Spurling maneuver (some left upper shoulder pain, no pain in the left upper extremity); no cervical spine tenderness. Neurologic: Bilateral upper extremity strength and sensation normal; deep tendon reflexes (biceps, triceps, brachial radialis) normal; sensation intact to light touch.      Assessment & Plan:

## 2012-03-26 NOTE — Assessment & Plan Note (Signed)
Hemoglobin A1C  Date Value Range Status  03/26/2012 6.7   Final  03/19/2012 7.0* 4.0 - 6.0 % Final  09/12/2011 7.3   Final     Assessment: Diabetes control: controlled Progress toward goals: at goal Barriers to meeting goals: no barriers identified  Plan: Continue current medications; patient will followup with her endocrinologist Dr. Evlyn Kanner as scheduled. Instruction/counseling given: reminded to get eye exam and reminded to bring medications to each visit

## 2012-03-26 NOTE — Assessment & Plan Note (Signed)
Assessment: Patient has symptoms consistent with occasional acid reflux.  Plan: Treat with omeprazole 20 mg daily.

## 2012-03-26 NOTE — Assessment & Plan Note (Signed)
Assessment: Patient was treated for urinary tract infection in June and reports resolution of her back pain following treatment.  She was apparently recently started on another course of treatment with ciprofloxacin, which he is currently taking.  She is asymptomatic.  Plan: Patient will complete her current course of oral ciprofloxacin.  I advised her to call or return if she has recurrence of her symptoms.

## 2012-04-09 ENCOUNTER — Encounter: Payer: Self-pay | Admitting: Internal Medicine

## 2012-04-09 ENCOUNTER — Ambulatory Visit (INDEPENDENT_AMBULATORY_CARE_PROVIDER_SITE_OTHER): Payer: Medicare Other | Admitting: Internal Medicine

## 2012-04-09 VITALS — BP 114/65 | HR 80 | Temp 99.0°F | Ht 67.0 in | Wt 213.1 lb

## 2012-04-09 DIAGNOSIS — J45909 Unspecified asthma, uncomplicated: Secondary | ICD-10-CM

## 2012-04-09 DIAGNOSIS — N390 Urinary tract infection, site not specified: Secondary | ICD-10-CM

## 2012-04-09 DIAGNOSIS — M47812 Spondylosis without myelopathy or radiculopathy, cervical region: Secondary | ICD-10-CM

## 2012-04-09 MED ORDER — BUDESONIDE-FORMOTEROL FUMARATE 80-4.5 MCG/ACT IN AERO: 2.0000 | INHALATION_SPRAY | Freq: Two times a day (BID) | RESPIRATORY_TRACT | Status: DC

## 2012-04-09 NOTE — Assessment & Plan Note (Signed)
Assessment: Patient reports a mild exacerbation of her asthma last week which iha now resolved.  She has not yet gotten her albuterol inhaler refilled.  She is currently taking Qvar rather than Symbicort, although she was previously on Symbicort prescribed by Dr. Sherene Sires ; she reports no past problem with the Symbicort, but simply ran out of the medication and did not have it refilled.  I think she would benefit more from the Symbicort then the Qvar.  Plan: I advised patient to stop Qvar and resume Symbicort at her previous dose of 2 puffs twice a day; I also advised her to have her albuterol inhaler refilled and to use it 2 puffs every 6 hours as needed for wheezing or shortness of breath.

## 2012-04-09 NOTE — Assessment & Plan Note (Signed)
Assessment: Patient's symptoms have resolved following treatment with ciprofloxacin.  Plan: I advised patient to call or return if she has recurrence of her symptoms.

## 2012-04-09 NOTE — Assessment & Plan Note (Signed)
Assessment: Cervical spine x-rays on 03/26/2012 showed significant multilevel cervical spondylosis, particularly at the C5- 6 and C6-7 levels; oblique views show a relatively greater degree of foraminal stenosis on the left.  Since her last visit, her pain has resolved; she has normal strength in her upper extremities, with mild numbness and tingling of her left forearm and hand.  I discussed the option of further imaging with her, but given her improvement I do not think it is necessary at this time.    Plan: The plan is to refer to physical therapy.  I advised patient to call or return if she has any increase in her symptoms, recurrence of pain, or any new problems such as weakness.

## 2012-04-09 NOTE — Progress Notes (Signed)
  Subjective:    Patient ID: [name] [name], female    DOB: 1935/09/23, 76 y.o.   MRN: [id number]  HPI Patient returns for followup of her cervical spondylosis with left radiculopathy, asthma, and a recently treated urinary tract infection.  She reports that her left shoulder pain and left upper extremity pain have resolved since her last visit.  She still has a small amount of numbness and tingling in her left arm and hand.  She denies any left upper extremity weakness.  She reports a mild asthma exacerbation last week, but says her symptoms have resolved.  She reports that she did not get her albuterol inhaler at the pharmacy, and that she was told that they had no prescription although this was sent last week.  She has been using Qvar rather than Symbicort recently as a steroid inhaler, having gotten samples from Dr. Evlyn Kanner.  She reports that her urinary symptoms resolved completely following treatment with ciprofloxacin, which she has now completed.   Review of Systems  Respiratory: Negative for cough, shortness of breath and wheezing.   Cardiovascular: Negative for chest pain and leg swelling.  Genitourinary: Negative for dysuria, frequency and difficulty urinating.  Neurological: Negative for weakness.       Objective:   Physical Exam  Cardiovascular: Normal rate and regular rhythm.  Exam reveals no gallop and no friction rub.   No murmur heard. Pulmonary/Chest: Effort normal and breath sounds normal. No respiratory distress. She has no wheezes. She has no rales.  Musculoskeletal:       Cervical back: Normal. She exhibits no tenderness.  Neurological: She has normal strength.       Assessment & Plan:

## 2012-04-22 ENCOUNTER — Ambulatory Visit: Payer: Medicare Other | Attending: Internal Medicine | Admitting: Physical Therapy

## 2012-04-22 DIAGNOSIS — R293 Abnormal posture: Secondary | ICD-10-CM | POA: Insufficient documentation

## 2012-04-22 DIAGNOSIS — M542 Cervicalgia: Secondary | ICD-10-CM | POA: Insufficient documentation

## 2012-04-22 DIAGNOSIS — IMO0001 Reserved for inherently not codable concepts without codable children: Secondary | ICD-10-CM | POA: Insufficient documentation

## 2012-04-22 DIAGNOSIS — M25519 Pain in unspecified shoulder: Secondary | ICD-10-CM | POA: Insufficient documentation

## 2012-04-23 ENCOUNTER — Ambulatory Visit: Payer: Medicare Other | Admitting: Physical Therapy

## 2012-05-02 ENCOUNTER — Other Ambulatory Visit: Payer: Self-pay | Admitting: *Deleted

## 2012-05-02 MED ORDER — TRAMADOL HCL 50 MG PO TABS: 50.0000 mg | ORAL_TABLET | Freq: Three times a day (TID) | ORAL | Status: AC | PRN

## 2012-05-02 MED ORDER — NAPROXEN SODIUM 220 MG PO CAPS: 2.0000 | ORAL_CAPSULE | Freq: Two times a day (BID) | ORAL | Status: DC | PRN

## 2012-05-02 NOTE — Telephone Encounter (Signed)
Spoke w/ pt, gave her med instructions and that we will schedule appt early next week, she voiced understanding twice. Will call w/ appt mon

## 2012-05-02 NOTE — Telephone Encounter (Signed)
Dr Meredith Pel pt calls and states her neck and shoulder pain is much worse and she does not start therapy until next week, she feels very bad and ask for "some kind of pain pill", i ask if the hydrocodne helped in June when she was given some, she states "yes but they only gave me a few" could you possibly order her some?

## 2012-05-02 NOTE — Telephone Encounter (Signed)
Plan is increase naproxen 220 mg to a dose of 2 tablets twice a day as needed for pain, and add tramadol 50 mg every 8 hours as needed for pain (patient has been on tramadol in the past).  Please advise patient of these changes, and also advise that she be seen in the clinic next week since she is having increased pain.

## 2012-05-05 ENCOUNTER — Telehealth: Payer: Self-pay | Admitting: *Deleted

## 2012-05-05 NOTE — Telephone Encounter (Signed)
Pt notified for appt today or tomorrow, she states she feels better and does not think she needs to see the doctor, states she is going to PT and she thinks she will be ok, she was encouraged to be seen in clinic but refuses, she is then suggested that when she gets to PT and is evaluated then if it is felt she needs to be seen in clinic an appt will be scheduled, she is agreeable with this and states she will do so.

## 2012-05-06 ENCOUNTER — Ambulatory Visit: Payer: Medicare Other | Attending: Internal Medicine | Admitting: Rehabilitative and Restorative Service Providers"

## 2012-05-06 DIAGNOSIS — R293 Abnormal posture: Secondary | ICD-10-CM | POA: Insufficient documentation

## 2012-05-06 DIAGNOSIS — IMO0001 Reserved for inherently not codable concepts without codable children: Secondary | ICD-10-CM | POA: Insufficient documentation

## 2012-05-06 DIAGNOSIS — M542 Cervicalgia: Secondary | ICD-10-CM | POA: Insufficient documentation

## 2012-05-06 DIAGNOSIS — M25519 Pain in unspecified shoulder: Secondary | ICD-10-CM | POA: Insufficient documentation

## 2012-05-09 ENCOUNTER — Ambulatory Visit: Payer: Medicare Other | Admitting: Physical Therapy

## 2012-05-13 ENCOUNTER — Ambulatory Visit: Payer: Medicare Other | Admitting: Rehabilitation

## 2012-05-16 ENCOUNTER — Encounter: Payer: Medicare Other | Admitting: Physical Therapy

## 2012-05-20 ENCOUNTER — Ambulatory Visit: Payer: Medicare Other | Admitting: Rehabilitation

## 2012-05-22 ENCOUNTER — Encounter: Payer: Medicare Other | Admitting: Rehabilitation

## 2012-05-23 ENCOUNTER — Ambulatory Visit: Payer: Medicare Other | Admitting: Physical Therapy

## 2012-05-27 ENCOUNTER — Ambulatory Visit: Payer: Medicare Other | Attending: Internal Medicine | Admitting: Physical Therapy

## 2012-05-27 DIAGNOSIS — R293 Abnormal posture: Secondary | ICD-10-CM | POA: Insufficient documentation

## 2012-05-27 DIAGNOSIS — IMO0001 Reserved for inherently not codable concepts without codable children: Secondary | ICD-10-CM | POA: Insufficient documentation

## 2012-05-27 DIAGNOSIS — M25519 Pain in unspecified shoulder: Secondary | ICD-10-CM | POA: Insufficient documentation

## 2012-05-27 DIAGNOSIS — M542 Cervicalgia: Secondary | ICD-10-CM | POA: Insufficient documentation

## 2012-05-29 ENCOUNTER — Encounter: Payer: Medicare Other | Admitting: Physical Therapy

## 2012-06-04 ENCOUNTER — Encounter: Payer: Medicare Other | Admitting: Physical Therapy

## 2012-06-05 ENCOUNTER — Emergency Department (HOSPITAL_COMMUNITY)
Admission: EM | Admit: 2012-06-05 | Discharge: 2012-06-05 | Disposition: A | Discharge status: Home / Self Care | Payer: Medicare Other | Attending: Emergency Medicine | Admitting: Emergency Medicine

## 2012-06-05 ENCOUNTER — Emergency Department (HOSPITAL_COMMUNITY): Payer: Medicare Other

## 2012-06-05 ENCOUNTER — Encounter (HOSPITAL_COMMUNITY): Payer: Self-pay | Admitting: Emergency Medicine

## 2012-06-05 DIAGNOSIS — I10 Essential (primary) hypertension: Secondary | ICD-10-CM | POA: Insufficient documentation

## 2012-06-05 DIAGNOSIS — R109 Unspecified abdominal pain: Secondary | ICD-10-CM | POA: Insufficient documentation

## 2012-06-05 DIAGNOSIS — M533 Sacrococcygeal disorders, not elsewhere classified: Secondary | ICD-10-CM | POA: Insufficient documentation

## 2012-06-05 DIAGNOSIS — M545 Low back pain, unspecified: Secondary | ICD-10-CM | POA: Insufficient documentation

## 2012-06-05 DIAGNOSIS — J45909 Unspecified asthma, uncomplicated: Secondary | ICD-10-CM | POA: Insufficient documentation

## 2012-06-05 DIAGNOSIS — J701 Chronic and other pulmonary manifestations due to radiation: Secondary | ICD-10-CM | POA: Insufficient documentation

## 2012-06-05 DIAGNOSIS — I2510 Atherosclerotic heart disease of native coronary artery without angina pectoris: Secondary | ICD-10-CM | POA: Insufficient documentation

## 2012-06-05 DIAGNOSIS — Z79899 Other long term (current) drug therapy: Secondary | ICD-10-CM | POA: Insufficient documentation

## 2012-06-05 DIAGNOSIS — E119 Type 2 diabetes mellitus without complications: Secondary | ICD-10-CM | POA: Insufficient documentation

## 2012-06-05 LAB — URINE MICROSCOPIC-ADD ON

## 2012-06-05 LAB — BASIC METABOLIC PANEL
CO2: 27 mEq/L (ref 19–32)
Calcium: 10.4 mg/dL (ref 8.4–10.5)
Creatinine, Ser: 0.8 mg/dL (ref 0.50–1.10)
GFR calc Af Amer: 83 mL/min — ABNORMAL LOW (ref >90)
GFR calc non Af Amer: 71 mL/min — ABNORMAL LOW (ref >90)
Sodium: 138 mEq/L (ref 135–145)

## 2012-06-05 LAB — URINALYSIS, ROUTINE W REFLEX MICROSCOPIC
Glucose, UA: NEGATIVE mg/dL
Ketones, ur: NEGATIVE mg/dL
Nitrite: NEGATIVE
Protein, ur: NEGATIVE mg/dL
pH: 6 (ref 5.0–8.0)

## 2012-06-05 LAB — GRAM STAIN

## 2012-06-05 MED ORDER — HYDROCODONE-ACETAMINOPHEN 5-325 MG PO TABS: 1.0000 | ORAL_TABLET | Freq: Four times a day (QID) | ORAL | Status: DC | PRN

## 2012-06-05 MED ORDER — MORPHINE SULFATE 4 MG/ML IJ SOLN
4.0000 mg | Freq: Once | INTRAMUSCULAR | Status: AC
  Administered 2012-06-05: 4 mg via INTRAVENOUS
  Filled 2012-06-05: qty 1

## 2012-06-05 MED ORDER — IBUPROFEN 800 MG PO TABS: 800.0000 mg | ORAL_TABLET | Freq: Three times a day (TID) | ORAL | Status: DC

## 2012-06-05 NOTE — ED Provider Notes (Signed)
History     CSN: [id number]  Arrival date & time 06/05/12  1036   First MD Initiated Contact with Patient 06/05/12 1214      Chief Complaint  Patient presents with  . Flank Pain    (Consider location/radiation/quality/duration/timing/severity/associated sxs/prior treatment) Patient is a 76 y.o. female presenting with back pain. The history is provided by the patient.  Back Pain  This is a recurrent problem. The current episode started more than 2 days ago. The problem occurs constantly. The problem has not changed since onset.The pain is associated with no known injury. The pain is present in the sacro-iliac joint and gluteal region. The pain does not radiate. The pain is severe. The symptoms are aggravated by bending and twisting. Stiffness is present in the morning. Pertinent negatives include no chest pain, no fever, no numbness, no headaches, no abdominal pain, no abdominal swelling, no bowel incontinence, no perianal numbness, no bladder incontinence, no dysuria, no pelvic pain, no leg pain, no paresthesias, no paresis, no tingling and no weakness. She has tried nothing for the symptoms. The treatment provided no relief. Risk factors: spinal stenosis (on left side)    Past Medical History  Diagnosis Date  . Asthma   . GERD (gastroesophageal reflux disease)   . Hypertension   . CAD (coronary artery disease)   . Obesity   . Diabetes mellitus   . Cervical spondylosis 03/26/2012    Past Surgical History  Procedure Date  . Cholecystectomy   . Appendectomy   . Abdominal hysterectomy   . Rotator cuff repair 2003    Right  . Lumbar laminectomy 2002    Family History  Problem Relation Age of Onset  . Cancer Mother     Question pancreatic  . Coronary artery disease Father   . Breast cancer Sister 2  . Coronary artery disease Sister 11  . Colon cancer Neg Hx     History  Substance Use Topics  . Smoking status: Never Smoker   . Smokeless tobacco: Never Used  . Alcohol  Use: No    OB History    Grav Para Term Preterm Abortions TAB SAB Ect Mult Living                  Review of Systems  Constitutional: Negative for fever.  Respiratory: Negative for cough and shortness of breath.   Cardiovascular: Negative for chest pain.  Gastrointestinal: Negative for nausea, abdominal pain, diarrhea and bowel incontinence.  Genitourinary: Negative for bladder incontinence, dysuria and pelvic pain.  Musculoskeletal: Positive for back pain.  Neurological: Negative for tingling, weakness, numbness, headaches and paresthesias.  All other systems reviewed and are negative.    Allergies  Aspirin; Contrast media; Oxycodone-acetaminophen; and Sulfonamide derivatives  Home Medications   Current Outpatient Rx  Name Route Sig Dispense Refill  . ALBUTEROL SULFATE HFA 108 (90 BASE) MCG/ACT IN AERS Inhalation Inhale 2 puffs into the lungs every 6 (six) hours as needed for wheezing or shortness of breath. 1 Inhaler 11  . BUDESONIDE-FORMOTEROL FUMARATE 80-4.5 MCG/ACT IN AERO Inhalation Inhale 2 puffs into the lungs 2 (two) times daily. 1 Inhaler 12  . CLONAZEPAM 2 MG PO TABS Oral Take 2 mg by mouth 2 (two) times daily.    Marland Kitchen METFORMIN HCL 500 MG PO TABS Oral Take 500 mg by mouth 2 (two) times daily with a meal.     . NOVOLOG MIX 70/30 (70-30) 100 UNIT/ML [place] SUSP Subcutaneous Inject 18 Units into the skin daily.  Or as directed    . SIMVASTATIN 40 MG PO TABS Oral Take 40 mg by mouth at bedtime.    . TRAMADOL HCL 50 MG PO TABS Oral Take 50 mg by mouth every 8 (eight) hours as needed. For pain    . VALSARTAN-HYDROCHLOROTHIAZIDE 160-12.5 MG PO TABS Oral Take 1 tablet by mouth daily.    Marland Kitchen VITAMIN D (ERGOCALCIFEROL) 50000 UNITS PO CAPS Oral Take 50,000 Units by mouth 2 (two) times a week. 1 capsule every Tuesday and Friday      BP 134/85  Pulse 70  Temp 98.1 F (36.7 C) (Oral)  Resp 18  SpO2 97%  Physical Exam  Nursing note and vitals reviewed. Constitutional: She is  oriented to person, place, and time. She appears well-developed and well-nourished. No distress.  HENT:  Head: Normocephalic and atraumatic.  Eyes: EOM are normal. Pupils are equal, round, and reactive to light.  Neck: Normal range of motion.  Cardiovascular: Normal rate and normal heart sounds.   Pulmonary/Chest: Effort normal and breath sounds normal. No respiratory distress.  Abdominal: Soft. She exhibits no distension. There is no tenderness.  Musculoskeletal: Normal range of motion. She exhibits no edema and no tenderness.  Neurological: She is alert and oriented to person, place, and time. No cranial nerve deficit. She exhibits normal muscle tone. Coordination normal.  Skin: Skin is warm and dry.       ED Course  Procedures (including critical care time)  Labs Reviewed  URINALYSIS, ROUTINE W REFLEX MICROSCOPIC - Abnormal; Notable for the following:    Leukocytes, UA SMALL (*)     All other components within normal limits  BASIC METABOLIC PANEL - Abnormal; Notable for the following:    Glucose, Bld 130 (*)     GFR calc non Af Amer 71 (*)     GFR calc Af Amer 83 (*)     All other components within normal limits  GRAM STAIN  URINE MICROSCOPIC-ADD ON  URINE CULTURE   Dg Pelvis 1-2 Views  06/05/2012  *RADIOLOGY REPORT*  Clinical Data: Right sided pelvic pain.  PELVIS - 1-2 VIEW  Comparison: None.  Findings: Mild right hip osteoarthritis is present with joint space narrowing.  Pelvic rings appear intact.  Calcified phleboliths in the anatomic pelvis.  L4-L5 spondylosis incidentally noted.  IMPRESSION: No acute osseous abnormality. Mild right hip osteoarthritis.   Original Report Authenticated By: Andreas Newport, M.D.      1. Back pain, lumbosacral       MDM  12:14 PM Pt seen and examined. Pt with several days of increasing right low back/flank pain. She states this is similar to her recent UTI. Will work up both this and SI pain (where patient claims pain is on exam) with  XR.  3:21 PM Workup unremarkable. Pt with either muscle strain or radicular back pain most likely. Will refer patient to ortho if her pain does not improve or worsens. Pt given Rx for norco (#10) and motrin.     Daleen Bo, MD 06/05/12 [phone]

## 2012-06-05 NOTE — ED Notes (Signed)
Pt c/o right flank pain x 3 days that feels similar to when had UTI 2 months ago; pt denies burning with urination

## 2012-06-05 NOTE — ED Provider Notes (Signed)
I saw and evaluated the patient, reviewed the resident's note and I agree with the findings and plan.   Loren Racer, MD 06/05/12 1530

## 2012-06-06 LAB — URINE CULTURE: Colony Count: 2000

## 2012-06-24 ENCOUNTER — Encounter: Payer: Self-pay | Admitting: Internal Medicine

## 2012-06-24 ENCOUNTER — Ambulatory Visit (INDEPENDENT_AMBULATORY_CARE_PROVIDER_SITE_OTHER): Payer: Medicare Other | Admitting: Internal Medicine

## 2012-06-24 VITALS — BP 143/81 | HR 73 | Temp 98.3°F | Ht 67.0 in | Wt 213.4 lb

## 2012-06-24 DIAGNOSIS — I10 Essential (primary) hypertension: Secondary | ICD-10-CM

## 2012-06-24 DIAGNOSIS — Z0000 Encounter for general adult medical examination without abnormal findings: Secondary | ICD-10-CM

## 2012-06-24 DIAGNOSIS — J9610 Chronic respiratory failure, unspecified whether with hypoxia or hypercapnia: Secondary | ICD-10-CM

## 2012-06-24 DIAGNOSIS — M4716 Other spondylosis with myelopathy, lumbar region: Secondary | ICD-10-CM

## 2012-06-24 DIAGNOSIS — E119 Type 2 diabetes mellitus without complications: Secondary | ICD-10-CM

## 2012-06-24 DIAGNOSIS — Z23 Encounter for immunization: Secondary | ICD-10-CM

## 2012-06-24 DIAGNOSIS — E785 Hyperlipidemia, unspecified: Secondary | ICD-10-CM

## 2012-06-24 NOTE — Assessment & Plan Note (Signed)
Mildly above goal today at 143/81 with pulse 73 bpm, will defer changes in regimen -continue valsartan-hctz 160-12.5 mg qd

## 2012-06-24 NOTE — Assessment & Plan Note (Signed)
L4-L5 spondylosis, acute exacerbation secondary to fall, prior lumbar laminectomy per [facility] in 2002, pt refuses referral to Orthopedics for re-evaluation -refer to PT -continue Tramadol and prn ibuprofen or hrdrocodone

## 2012-06-24 NOTE — Assessment & Plan Note (Addendum)
Well controlled with HgbA1c 6.8 today on Metformin 500 mg bid, Novolog 70/30 18Units qd -evaluated by DME today -schedule for DME visit on f/u with PCP -needs foot exam at next visit

## 2012-06-24 NOTE — Assessment & Plan Note (Signed)
Flu shot today 

## 2012-06-24 NOTE — Progress Notes (Signed)
  Subjective:    Patient ID: [name] [name], female    DOB: 1936-05-07, 76 y.o.   MRN: [id number]  HPI Pt with hx significant for well controlled diabetes mellitus Type 2, hypertension, hyperlipidemia and cervical spondylosis who presents for post-ED visit after fall from stool resulting in continued lower back pain.  Xrays in ED demonstrated L4-L5 spondylosis and osteoarthritis of right hip.  Of note, pt reports that pain is on left side which is different from ED report.  She was discharged with hydrocodone and Mortin for pain of which states "is too strong".  She is currently taking 1 hydrocodone 5/325 in the morning and one in the evening depending on her pain.  If the pain is not very bad she will use tramadol or ibuprofen.  Pt states that often the pain is so severe that it is difficult to move.  This is associated with numbess and tingling in the left LE as well.  Denies saddle paresthesia, loss of urine or bowel function.  States that she had back surgery "years ago" and will not consent to it again.  She has had physical therapy on her neck which has improved her cervical radiculopathy symptoms. She is willing to have physical therapy but prefers to have it at a smaller facility than [facility] due to parking issues and long walk to the office (ie in more pain by the time she gets from the car to PT).   Review of Systems  Constitutional: Negative for fever and fatigue.  Respiratory: Negative for chest tightness and shortness of breath.   Cardiovascular: Negative for chest pain, palpitations and leg swelling.  Gastrointestinal: Negative for constipation.  Genitourinary:       No incontinence  Musculoskeletal: Positive for back pain and arthralgias. Negative for gait problem.  Neurological: Positive for numbness. Negative for dizziness, syncope, facial asymmetry, weakness, light-headedness and headaches.       Objective:   Physical Exam  Constitutional: She is oriented to  person, place, and time. She appears well-developed and well-nourished. No distress.  HENT:  Head: Normocephalic and atraumatic.  Neck: Normal range of motion. Neck supple.  Cardiovascular: Normal rate, regular rhythm and normal heart sounds.   Pulmonary/Chest: Effort normal and breath sounds normal.  Abdominal: Soft. Bowel sounds are normal.  Musculoskeletal: She exhibits no edema.       Lumbar back: She exhibits decreased range of motion and tenderness. She exhibits no bony tenderness and no edema.       Back:  Neurological: She is alert and oriented to person, place, and time.  Skin: Skin is warm and dry.  Psychiatric: She has a normal mood and affect.          Assessment & Plan:  1. Lumbar spondylosis: acute exacerbation secondary to fall, prior back surgery per [facility], pt refuses referral to Orthopedics for re-evaluation -refer to PT -continue Tramadol and prn ibuprofen or hrdrocodone  2. Diabetes Mellitus, Type 2: controlled HgbA1c today  3. Preventative Care: flu shot given today

## 2012-06-24 NOTE — Patient Instructions (Signed)
Continue with your current pain medicines especially the Ibuprofen and Tramadol. We will refer you to physical therapy at the facility off of [address] Follow-up in 3-4 months with Dr. Meredith Pel.

## 2012-07-01 ENCOUNTER — Ambulatory Visit: Payer: Medicare Other | Admitting: Physical Therapy

## 2012-09-04 NOTE — Addendum Note (Signed)
Addended by: Neomia Dear on: 09/04/2012 05:56 PM   Modules accepted: Orders

## 2012-09-08 ENCOUNTER — Emergency Department (HOSPITAL_COMMUNITY)
Admission: EM | Admit: 2012-09-08 | Discharge: 2012-09-08 | Disposition: A | Discharge status: Home / Self Care | Payer: Medicare Other | Attending: Emergency Medicine | Admitting: Emergency Medicine

## 2012-09-08 ENCOUNTER — Encounter (HOSPITAL_COMMUNITY): Payer: Self-pay | Admitting: *Deleted

## 2012-09-08 ENCOUNTER — Emergency Department (HOSPITAL_COMMUNITY): Payer: Medicare Other

## 2012-09-08 DIAGNOSIS — J449 Chronic obstructive pulmonary disease, unspecified: Secondary | ICD-10-CM | POA: Insufficient documentation

## 2012-09-08 DIAGNOSIS — Z79899 Other long term (current) drug therapy: Secondary | ICD-10-CM | POA: Insufficient documentation

## 2012-09-08 DIAGNOSIS — Z8739 Personal history of other diseases of the musculoskeletal system and connective tissue: Secondary | ICD-10-CM | POA: Insufficient documentation

## 2012-09-08 DIAGNOSIS — I10 Essential (primary) hypertension: Secondary | ICD-10-CM | POA: Insufficient documentation

## 2012-09-08 DIAGNOSIS — N390 Urinary tract infection, site not specified: Secondary | ICD-10-CM | POA: Insufficient documentation

## 2012-09-08 DIAGNOSIS — E669 Obesity, unspecified: Secondary | ICD-10-CM | POA: Insufficient documentation

## 2012-09-08 DIAGNOSIS — K219 Gastro-esophageal reflux disease without esophagitis: Secondary | ICD-10-CM | POA: Insufficient documentation

## 2012-09-08 DIAGNOSIS — J45909 Unspecified asthma, uncomplicated: Secondary | ICD-10-CM | POA: Insufficient documentation

## 2012-09-08 DIAGNOSIS — I2510 Atherosclerotic heart disease of native coronary artery without angina pectoris: Secondary | ICD-10-CM | POA: Insufficient documentation

## 2012-09-08 DIAGNOSIS — E119 Type 2 diabetes mellitus without complications: Secondary | ICD-10-CM | POA: Insufficient documentation

## 2012-09-08 DIAGNOSIS — J4489 Other specified chronic obstructive pulmonary disease: Secondary | ICD-10-CM | POA: Insufficient documentation

## 2012-09-08 DIAGNOSIS — IMO0001 Reserved for inherently not codable concepts without codable children: Secondary | ICD-10-CM

## 2012-09-08 LAB — BASIC METABOLIC PANEL
BUN: 13 mg/dL (ref 6–23)
CO2: 29 mEq/L (ref 19–32)
GFR calc non Af Amer: 65 mL/min — ABNORMAL LOW (ref >90)
Glucose, Bld: 139 mg/dL — ABNORMAL HIGH (ref 70–99)
Potassium: 3.5 mEq/L (ref 3.5–5.1)
Sodium: 139 mEq/L (ref 135–145)

## 2012-09-08 LAB — CBC WITH DIFFERENTIAL/PLATELET
Basophils Relative: 1 % (ref 0–1)
Eosinophils Absolute: 0.4 10*3/uL (ref 0.0–0.7)
Eosinophils Relative: 4 % (ref 0–5)
HCT: 43.2 % (ref 36.0–46.0)
Hemoglobin: 13.8 g/dL (ref 12.0–15.0)
Lymphs Abs: 3 10*3/uL (ref 0.7–4.0)
MCH: 27.2 pg (ref 26.0–34.0)
MCHC: 31.9 g/dL (ref 30.0–36.0)
MCV: 85.2 fL (ref 78.0–100.0)
Monocytes Absolute: 0.8 10*3/uL (ref 0.1–1.0)
Monocytes Relative: 8 % (ref 3–12)
Neutrophils Relative %: 58 % (ref 43–77)
RBC: 5.07 MIL/uL (ref 3.87–5.11)

## 2012-09-08 LAB — URINE MICROSCOPIC-ADD ON

## 2012-09-08 LAB — URINALYSIS, ROUTINE W REFLEX MICROSCOPIC
Bilirubin Urine: NEGATIVE
Glucose, UA: NEGATIVE mg/dL
Ketones, ur: NEGATIVE mg/dL
Protein, ur: 30 mg/dL — AB
Urobilinogen, UA: 0.2 mg/dL (ref 0.0–1.0)

## 2012-09-08 LAB — POCT I-STAT TROPONIN I: Troponin i, poc: 0.01 ng/mL (ref 0.00–0.08)

## 2012-09-08 MED ORDER — LEVOFLOXACIN 750 MG PO TABS: 750.0000 mg | ORAL_TABLET | Freq: Every day | ORAL | Status: DC

## 2012-09-08 MED ORDER — PROMETHAZINE-DM 6.25-15 MG/5ML PO SYRP: 5.0000 mL | ORAL_SOLUTION | Freq: Four times a day (QID) | ORAL | Status: DC | PRN

## 2012-09-08 MED ORDER — PREDNISONE 20 MG PO TABS: 20.0000 mg | ORAL_TABLET | Freq: Every day | ORAL | Status: DC

## 2012-09-08 MED ORDER — ALBUTEROL (5 MG/ML) CONTINUOUS INHALATION SOLN
10.0000 mg/h | INHALATION_SOLUTION | RESPIRATORY_TRACT | Status: AC
  Administered 2012-09-08: 10 mg/h via RESPIRATORY_TRACT
  Filled 2012-09-08: qty 20

## 2012-09-08 MED ORDER — ONDANSETRON 4 MG PO TBDP
8.0000 mg | ORAL_TABLET | Freq: Once | ORAL | Status: AC
Start: 2012-09-08 — End: 2012-09-08
  Administered 2012-09-08: 8 mg via ORAL
  Filled 2012-09-08: qty 2

## 2012-09-08 NOTE — ED Notes (Signed)
Pt given something to eat per PA. sts feels better

## 2012-09-08 NOTE — ED Provider Notes (Signed)
History     CSN: [id number]  Arrival date & time 09/08/12  8295   First MD Initiated Contact with Patient 09/08/12 1255      No chief complaint on file.   (Consider location/radiation/quality/duration/timing/severity/associated sxs/prior treatment) HPI Patient presents to the emergency department complaining of chest pain, shortness of breath, and shoulder pain for an undisclosed amount of time, but worsening over the past week. She has a history of COPD, and has had a cough for the past few weeks, which is non-productive and keeps her up at night. She describes the chest pain as "soreness" and worse when she coughs. She says she is fatigued and weak. She also complains of left neck and shoulder pain, which is not new but has worsened over the past few months. She has a history of sciatica and has had a lumbar laminectomy. No fever, chills, vomiting, diarrhea, abdominal pain, swelling in her extremities.    Past Medical History  Diagnosis Date  . Asthma   . GERD (gastroesophageal reflux disease)   . Hypertension   . CAD (coronary artery disease)   . Obesity   . Diabetes mellitus   . Cervical spondylosis 03/26/2012    Past Surgical History  Procedure Date  . Cholecystectomy   . Appendectomy   . Abdominal hysterectomy   . Rotator cuff repair 2003    Right  . Lumbar laminectomy 2002    Family History  Problem Relation Age of Onset  . Cancer Mother     Question pancreatic  . Coronary artery disease Father   . Breast cancer Sister 52  . Coronary artery disease Sister 19  . Colon cancer Neg Hx     History  Substance Use Topics  . Smoking status: Never Smoker   . Smokeless tobacco: Never Used  . Alcohol Use: No    OB History    Grav Para Term Preterm Abortions TAB SAB Ect Mult Living                  Review of Systems All other systems negative except as documented in the HPI. All pertinent positives and negatives as reviewed in the HPI.  Allergies  Aspirin;  Contrast media; Oxycodone-acetaminophen; and Sulfonamide derivatives  Home Medications   Current Outpatient Rx  Name  Route  Sig  Dispense  Refill  . ALBUTEROL SULFATE HFA 108 (90 BASE) MCG/ACT IN AERS   Inhalation   Inhale 2 puffs into the lungs every 6 (six) hours as needed for wheezing or shortness of breath.   1 Inhaler   11   . BUDESONIDE-FORMOTEROL FUMARATE 80-4.5 MCG/ACT IN AERO   Inhalation   Inhale 2 puffs into the lungs 2 (two) times daily.   1 Inhaler   12   . CLONAZEPAM 2 MG PO TABS   Oral   Take 2 mg by mouth 2 (two) times daily.         Marland Kitchen HYDROCODONE-ACETAMINOPHEN 5-325 MG PO TABS   Oral   Take 1-2 tablets by mouth every 6 (six) hours as needed for pain.   10 tablet   0   . IBUPROFEN 800 MG PO TABS   Oral   Take 1 tablet (800 mg total) by mouth 3 (three) times daily.   21 tablet   0   . METFORMIN HCL 500 MG PO TABS   Oral   Take 500 mg by mouth 2 (two) times daily with a meal.          .  NOVOLOG MIX 70/30 (70-30) 100 UNIT/ML [place] SUSP   Subcutaneous   Inject 18 Units into the skin daily. Or as directed         . SIMVASTATIN 40 MG PO TABS   Oral   Take 40 mg by mouth at bedtime.         . TRAMADOL HCL 50 MG PO TABS   Oral   Take 50 mg by mouth every 8 (eight) hours as needed. For pain         . VALSARTAN-HYDROCHLOROTHIAZIDE 160-12.5 MG PO TABS   Oral   Take 1 tablet by mouth daily.         Marland Kitchen VITAMIN D (ERGOCALCIFEROL) 50000 UNITS PO CAPS   Oral   Take 50,000 Units by mouth 2 (two) times a week. 1 capsule every Tuesday and Friday           BP 166/90  Pulse 113  Temp 98.5 F (36.9 C) (Oral)  SpO2 95%  Physical Exam  Constitutional: She is oriented to person, place, and time. She appears well-developed and well-nourished.  HENT:  Head: Normocephalic and atraumatic.  Mouth/Throat: No oropharyngeal exudate.  Neck: Neck supple.  Cardiovascular: Normal rate and regular rhythm.   Pulmonary/Chest: Effort normal. She has wheezes  in the right upper field, the right middle field, the right lower field, the left upper field, the left middle field and the left lower field. She exhibits tenderness.    Abdominal: Soft.  Lymphadenopathy:    She has no cervical adenopathy.  Neurological: She is alert and oriented to person, place, and time.  Skin: Skin is warm and dry.    ED Course  Procedures (including critical care time)   Labs Reviewed  CBC WITH DIFFERENTIAL  POCT I-STAT TROPONIN I  BASIC METABOLIC PANEL  URINALYSIS, ROUTINE W REFLEX MICROSCOPIC   Dg Chest 2 View  09/08/2012  *RADIOLOGY REPORT*  Clinical Data:  Cough, shortness of breath, nausea, diarrhea, cough, hypertension, diabetes  CHEST - 2 VIEW  Comparison: 09/14/2009  Findings: Normal heart size, mediastinal contours, and pulmonary vascularity. Minimal chronic peribronchial thickening without pulmonary infiltrate, pleural effusion or pneumothorax. No acute osseous findings.  IMPRESSION: Minimal chronic bronchitic changes. No acute abnormalities.   Original Report Authenticated By: Ulyses Southward, M.D.    1420: Patient receiving nebulizer treatment but nods when asked if she feels better. Will reassess after breathing treatment.  03:35PM Patient is feeling much improvement from the neb treatment and is no longer wheezing. The patient will be ambulated with pulse ox at this time.   0400 PM Patient is able to walk without difficulty. She is feeling dramatically better. Will refer back to her PCP. Told to return here for any worsening in her condition.  MDM  MDM Reviewed: vitals and nursing note Interpretation: labs, ECG and x-ray    Date: 09/08/2012  Rate: 106  Rhythm: sinus tachycardia  QRS Axis: left  Intervals: normal  ST/T Wave abnormalities: normal  Conduction Disutrbances:none  Narrative Interpretation:   Old EKG Reviewed: unchanged           Carlyle Dolly, PA-C 09/11/12 [phone]

## 2012-09-08 NOTE — ED Notes (Signed)
Pt states for a few days she has had cough, with wheezing, hurting in chest and left arm, and now stomach is churning.  Pt reports some frequent stools.  No vomiting

## 2012-09-08 NOTE — ED Notes (Signed)
Pt still receiving neb treatment

## 2012-09-09 LAB — URINE CULTURE: Culture: NO GROWTH

## 2012-09-13 NOTE — ED Provider Notes (Signed)
Medical screening examination/treatment/procedure(s) were performed by non-physician practitioner and as supervising physician I was immediately available for consultation/collaboration.  Juliet Rude. Rubin Payor, MD 09/13/12 0000

## 2012-09-24 ENCOUNTER — Ambulatory Visit: Payer: Medicare Other | Admitting: Internal Medicine

## 2012-10-08 ENCOUNTER — Ambulatory Visit: Payer: Medicare Other | Admitting: Internal Medicine

## 2012-10-11 ENCOUNTER — Encounter (HOSPITAL_COMMUNITY): Payer: Self-pay | Admitting: Emergency Medicine

## 2012-10-11 ENCOUNTER — Emergency Department (INDEPENDENT_AMBULATORY_CARE_PROVIDER_SITE_OTHER): Payer: Medicare Other

## 2012-10-11 ENCOUNTER — Emergency Department (HOSPITAL_COMMUNITY)
Admission: EM | Admit: 2012-10-11 | Discharge: 2012-10-11 | Disposition: A | Discharge status: Home / Self Care | Payer: 59 | Source: Home / Self Care | Attending: Emergency Medicine | Admitting: Emergency Medicine

## 2012-10-11 DIAGNOSIS — J45909 Unspecified asthma, uncomplicated: Secondary | ICD-10-CM

## 2012-10-11 MED ORDER — ALBUTEROL SULFATE (5 MG/ML) 0.5% IN NEBU
INHALATION_SOLUTION | RESPIRATORY_TRACT | Status: AC
  Filled 2012-10-11: qty 1

## 2012-10-11 MED ORDER — ALBUTEROL SULFATE (5 MG/ML) 0.5% IN NEBU
5.0000 mg | INHALATION_SOLUTION | Freq: Once | RESPIRATORY_TRACT | Status: AC
  Administered 2012-10-11: 5 mg via RESPIRATORY_TRACT

## 2012-10-11 MED ORDER — CETIRIZINE HCL 10 MG PO CAPS: 1.0000 | ORAL_CAPSULE | Freq: Every day | ORAL | Status: DC

## 2012-10-11 MED ORDER — ALBUTEROL SULFATE HFA 108 (90 BASE) MCG/ACT IN AERS: 1.0000 | INHALATION_SPRAY | Freq: Four times a day (QID) | RESPIRATORY_TRACT | Status: DC | PRN

## 2012-10-11 MED ORDER — PREDNISONE 20 MG PO TABS: 40.0000 mg | ORAL_TABLET | Freq: Every day | ORAL | Status: DC

## 2012-10-11 NOTE — ED Notes (Signed)
Waiting discharge papers 

## 2012-10-11 NOTE — ED Notes (Addendum)
Pt c/o productive cough with yellow sputum. Sob. Chest tightness, denies chest pain. Hx of asthma and bronchitis.   Pt states that symptoms get worse in the evening and at night making it hard to rest.   Pt has used inhaler with no relief in symptoms.   Symptoms present since January.

## 2012-10-11 NOTE — ED Provider Notes (Addendum)
History     CSN: [id number]  Arrival date & time 10/11/12  1100   First MD Initiated Contact with Patient 10/11/12 1120      Chief Complaint  Patient presents with  . URI    productive cough with yellow sputum. sob. chest tightness    (Consider location/radiation/quality/duration/timing/severity/associated sxs/prior treatment) HPI Comments: [name] [name] have been expressing respiratory symptoms include cough, shortness of breath and wheezing and sinus congestion since the beginning of January. She has gone to the emergency department once that was followup by her primary care Dr. patient reports that she has been prescribed antibiotics 2 different cycles during the month of January. She is also taking some prednisone and have been using albuterol at home. She describes currently a productive cough with yellow sputum and feeling chest tightness and shortness of breath and wheezing that aren't most noticeable at night that during the course of the day. The cough does wake her up at night interrupting her sleep.  She denies any fevers, abdominal pain nausea or vomiting.  " I have history of bronchitis not get better without antibiotics"  Patient is a 77 y.o. female presenting with URI. The history is provided by the patient.  URI Presenting symptoms: congestion and cough   Presenting symptoms: no fatigue, no fever, no rhinorrhea and no sore throat   Severity:  Moderate Onset quality:  Sudden Progression:  Unchanged Chronicity:  Recurrent Relieved by:  Nothing Worsened by:  Breathing Associated symptoms: sinus pain and wheezing   Associated symptoms: no headaches and no swollen glands     Past Medical History  Diagnosis Date  . Asthma   . GERD (gastroesophageal reflux disease)   . Hypertension   . CAD (coronary artery disease)   . Obesity   . Diabetes mellitus   . Cervical spondylosis 03/26/2012    Past Surgical History  Procedure Laterality Date  . Cholecystectomy    .  Appendectomy    . Abdominal hysterectomy    . Rotator cuff repair  2003    Right  . Lumbar laminectomy  2002    Family History  Problem Relation Age of Onset  . Cancer Mother     Question pancreatic  . Coronary artery disease Father   . Breast cancer Sister 77  . Coronary artery disease Sister 62  . Colon cancer Neg Hx     History  Substance Use Topics  . Smoking status: Never Smoker   . Smokeless tobacco: Never Used  . Alcohol Use: No    OB History   Grav Para Term Preterm Abortions TAB SAB Ect Mult Living                  Review of Systems  Constitutional: Positive for chills. Negative for fever, diaphoresis, activity change, fatigue and unexpected weight change.  HENT: Positive for congestion, postnasal drip and sinus pressure. Negative for sore throat, rhinorrhea, drooling and dental problem.   Eyes: Negative for discharge and itching.  Respiratory: Positive for cough and wheezing. Negative for apnea, choking, chest tightness and stridor.   Cardiovascular: Negative for chest pain and leg swelling.  Endocrine: Negative for cold intolerance, heat intolerance, polydipsia and polyphagia.  Skin: Negative for color change, pallor and rash.  Neurological: Negative for headaches.    Allergies  Aspirin; Contrast media; Oxycodone-acetaminophen; and Sulfa antibiotics  Home Medications   Current Outpatient Rx  Name  Route  Sig  Dispense  Refill  . albuterol (PROVENTIL HFA;VENTOLIN HFA) 108 (90  BASE) MCG/ACT inhaler   Inhalation   Inhale 2 puffs into the lungs every 6 (six) hours as needed for wheezing or shortness of breath.   1 Inhaler   11   . levofloxacin (LEVAQUIN) 750 MG tablet   Oral   Take 1 tablet (750 mg total) by mouth daily.   14 tablet   0   . metFORMIN (GLUCOPHAGE) 500 MG tablet   Oral   Take 500 mg by mouth 2 (two) times daily with a meal.          . NOVOLOG MIX 70/30 (70-30) 100 UNIT/ML injection   Subcutaneous   Inject 18 Units into the skin  daily with supper. Or as directed         . promethazine-dextromethorphan (PROMETHAZINE-DM) 6.25-15 MG/5ML syrup   Oral   Take 5 mLs by mouth 4 (four) times daily as needed for cough.   120 mL   0   . simvastatin (ZOCOR) 40 MG tablet   Oral   Take 40 mg by mouth at bedtime.         . valsartan-hydrochlorothiazide (DIOVAN-HCT) 160-12.5 MG per tablet   Oral   Take 1 tablet by mouth daily.         . Vitamin D, Ergocalciferol, (DRISDOL) 50000 UNITS CAPS   Oral   Take 50,000 Units by mouth 2 (two) times a week. 1 capsule every Tuesday and Friday         . predniSONE (DELTASONE) 20 MG tablet   Oral   Take 1 tablet (20 mg total) by mouth daily.   5 tablet   0     BP 161/91  Pulse 84  Temp(Src) 98.5 F (36.9 C) (Oral)  Resp 19  SpO2 95%  Physical Exam  Nursing note and vitals reviewed. Constitutional: Vital signs are normal. She appears well-developed and well-nourished.  Non-toxic appearance. She does not have a sickly appearance. She does not appear ill. No distress. She is not intubated.  HENT:  Head: Normocephalic.  Right Ear: Tympanic membrane normal.  Left Ear: Tympanic membrane normal.  Mouth/Throat: Uvula is midline. Posterior oropharyngeal erythema present. No oropharyngeal exudate, posterior oropharyngeal edema or tonsillar abscesses.  Eyes: Conjunctivae are normal. Pupils are equal, round, and reactive to light.  Neck: Normal range of motion. Neck supple. No JVD present.  Cardiovascular: Normal rate.  Exam reveals no gallop and no friction rub.   No murmur heard. Pulmonary/Chest: No accessory muscle usage. No apnea, not tachypneic and not bradypneic. She is not intubated. She is in respiratory distress. She has decreased breath sounds. She has wheezes. She has no rales. She exhibits no tenderness.  Musculoskeletal: Normal range of motion.  Lymphadenopathy:    She has no cervical adenopathy.  Skin: No rash noted. No erythema.    ED Course  Procedures  (including critical care time)  Labs Reviewed - No data to display Dg Chest 2 View  10/11/2012  *RADIOLOGY REPORT*  Clinical Data: Cough and shortness of breath  CHEST - 2 VIEW  Comparison: 09/08/2012 and prior radiographs  Findings: The cardiomediastinal silhouette is unremarkable. The lungs are clear. There is no evidence of focal airspace disease, pulmonary edema, suspicious pulmonary nodule/mass, pleural effusion, or pneumothorax. No acute bony abnormalities are identified.  IMPRESSION: No evidence of acute cardiopulmonary disease   Original Report Authenticated By: Harmon Pier, M.D.      No diagnosis found.    MDM  Exam: Symptoms consistent with reactive airway disease. Will have patient  take a second cycle of prednisone for 5 days use of a dural every 4-6 hours. Have encouraged patient to followup with primary care doctor she might meet criteria to be started on a steroid inhaler for maintenance in an attempt to diminish her asthma recurrent episodes.        Jimmie Molly, MD 10/11/12 1215  Jimmie Molly, MD 10/11/12 [phone]

## 2012-10-14 ENCOUNTER — Encounter: Payer: Self-pay | Admitting: Adult Health

## 2012-10-14 ENCOUNTER — Ambulatory Visit (INDEPENDENT_AMBULATORY_CARE_PROVIDER_SITE_OTHER): Payer: Medicare Other | Admitting: Adult Health

## 2012-10-14 VITALS — BP 144/72 | HR 97 | Temp 99.6°F | Ht 66.0 in | Wt 213.2 lb

## 2012-10-14 DIAGNOSIS — J45909 Unspecified asthma, uncomplicated: Secondary | ICD-10-CM

## 2012-10-14 MED ORDER — BUDESONIDE-FORMOTEROL FUMARATE 160-4.5 MCG/ACT IN AERO: 2.0000 | INHALATION_SPRAY | Freq: Two times a day (BID) | RESPIRATORY_TRACT | Status: DC

## 2012-10-14 MED ORDER — PREDNISONE 10 MG PO TABS: ORAL_TABLET | ORAL | Status: DC

## 2012-10-14 MED ORDER — LEVALBUTEROL HCL 0.63 MG/3ML IN NEBU
0.6300 mg | INHALATION_SOLUTION | Freq: Once | RESPIRATORY_TRACT | Status: AC
  Administered 2012-10-14: 0.63 mg via RESPIRATORY_TRACT

## 2012-10-14 MED ORDER — CEFDINIR 300 MG PO CAPS: 300.0000 mg | ORAL_CAPSULE | Freq: Two times a day (BID) | ORAL | Status: DC

## 2012-10-14 NOTE — Patient Instructions (Addendum)
Omnicef 300mg  Twice daily  For 7 days w/ food Prednisone taper over next week.  Begin Symbicort 160/4.40mcg 2 puffs Twice daily  , brush/rinse /gargle after use  Prilosec 20mg  daily before meal for heartburn.  Follow up in 1 week with Dr. Sherene Sires   Please contact office for sooner follow up if symptoms do not improve or worsen or seek emergency care

## 2012-10-14 NOTE — Assessment & Plan Note (Signed)
Slow to resolve exacerbation   Plan  Omnicef 300mg  Twice daily  For 7 days w/ food Prednisone taper over next week.  Begin Symbicort 160/4.65mcg 2 puffs Twice daily  , brush/rinse /gargle after use  Prilosec 20mg  daily before meal for heartburn.  Follow up in 1 week with Dr. Sherene Sires   Please contact office for sooner follow up if symptoms do not improve or worsen or seek emergency care

## 2012-10-14 NOTE — Progress Notes (Signed)
Subjective:     Patient ID: [name] [name], female   DOB: 01-01-1936, 77 y.o.   MRN: [id number]  HPI History of Present Illness:  48 yobf never smoker with known history of " Difficult t-to-control asthma," dating all the way back to the late 1990's typically flares with non-adherence with GERD rx but remains ICS dependent.   April 22, 2009 ov not seen > year doing well on symbicort as needed maybe once daily but once the weather got hot (x sev months) started using symbicort 80 4 puffs in 24 hours and just one albuterol. dyspnea and cough more daytime than night but sometimes it wakes her up at night recently also, no excess mucus.   rec Work on inhaler technique: relax and blow all the way out then take a nice smooth deep breath back in, triggering the inhaler at same time you start breathing in  symbicort 80 2 puffs first thing in am and 2 puffs again in pm about 12 hours later if needed (the second dose is optional)  prevacid 15 mg Take one 30-60 min before first and last meals of the day automatically   June 21, 2009--Returns for follow up and med review. Last visit recommended to add citrucel to regimen however she forgot to go get. Finances play a big role into her meds, she can get generics rx for $5. She was started on prevacid but it is not working. Continues to have upper abd fullness, bloating, and constipation. Her meds were reviewed and pt education was given. no sob or cough   April 07, 2010 Acute visit. Pt c/o increased SOB since the Spring 2011- states that this has been worse x 1 wk with heavy feeling in chest. She gets SOB walking from one room to the next. Heat makes breathing worse. She also c/o dry cough- mainly at bedtime but not waking up once asleep or early in am with resp c/o's . --tx w/ steroid taper.  PAGE 2>>>>>>>>> June 02, 2010 Acute visit. Pt c/o chest tightness and cough x 1 wk- cough is prod with yellow sputum. Has had chills but no fever. She wheezes when  lies down and sometimes with exertion. all this occurred when ran out of ppi assoc with sensation of pnds but no noct awakening or perceived increase need for saba. >>tx steroid taper for exacerbation. and pepcid increased.  July 05, 2010 --Presents for a follow up and med review. She is feeling much better from asthma flare last ov. She has finished her steroid taper. No overt reflux since increasing pepcid two times a day , no longer on PPI. Rare use of Proair since last ov. Her rx coverage is okay , she has not filled rx for diovan or symbicort we sent rx to pharm today to make sure she can afford copay. rec no change in rx   September 20, 2010 ov much worse since ran out of symbicort, didn't know we called it into pharmacy, thinks xopenex twice daily was a reasonable substitute and didn't realize it was already entered on the med cal as a as needed to take in place of proaire, both as needed. Has increase sob, cough and need for saba x several weeks, no purulent sputum.>>no changes, reinforced [facility]   October 31, 2010 --Returns for follow up and med review. Doing well except for tickle in throat w/ drainge also dry cough x 3 days. We reviewd all meds and updated med calendar. Denies chest pain, orthopnea, hemoptysis,  fever, n/v/d, edema, headache.  No discolored mucus, no otc used. Worse at night and in am with cough and throat ticlke. Wind makes worse.   02/19/2011 ov/Wert  Cc noct sob, some cough esp w/in a few hours of going to sleep  10/14/2012 Acute OV  Complains of prod cough with occasional yellow mucus, wheezing, increased SOB, tightness in chest x2-3  months.  Has been to the ER and UC over last 4 weeks. Was given levaquin and steroid taper however did not take this as directed.  Seen by PCP and started on Dulera but did not take this.  Took amoxicillin without any help.  Last abx was 1 week ago.  Did not take any prednisone .  CXR 2/15 -NAD  Previously on symbicort but ran out of this >1  year ago.  Still has wheezing , cough, congestion . Coughing up yellow mucus. Some dyspnea and tightness.      Smoking Status: never  Past Medical History:  ASTHMA (ICD-493.90)  - HFA 25% June 07, 2009 > 75% April 08, 2010 > 90% June 02, 2010 > 90% September 19, 2010 > 90% 02/19/2011  -overnight ox 10/21 2% O2 <88%, cont on on nocturnal O2  GERD clinical dx  OBESITY  Meds reviewed with pt education and computerized med calendar completed/adjusted. redone June 21, 2009  Diabetes mellitus, type II  Hypertension  Noncritical coronary artery disease by cardiac catheterization in 1999, with branch vessel coronary artery disease affecting the first obtuse marginal branch.  Complex med regimen--Meds reviewed with pt education and computerized med calendar adjusted October 31, 2010    Review of Systems Constitutional:   No  weight loss, night sweats,  Fevers, chills, fatigue, or  lassitude.  HEENT:   No headaches,  Difficulty swallowing,  Tooth/dental problems, or  Sore throat,                No sneezing, itching, ear ache, + nasal congestion, post nasal drip,   CV:  No chest pain,  Orthopnea, PND, swelling in lower extremities, anasarca, dizziness, palpitations, syncope.   GI  No heartburn, indigestion, abdominal pain, nausea, vomiting, diarrhea, change in bowel habits, loss of appetite, bloody stools.   Resp:  No coughing up of blood.  No change in color of mucus.  No wheezing.  No chest wall deformity  Skin: no rash or lesions.  GU: no dysuria, change in color of urine, no urgency or frequency.  No flank pain, no hematuria   MS:  No joint pain or swelling.  No decreased range of motion.  No back pain.  Psych:  No change in mood or affect. No depression or anxiety.  No memory loss.         Objective:   Physical Exam  wt 222 April 22, 2009 > 224 June 07, 2009 > 218 April 08, 2010 >>221 April 21, 2010 > 224 June 02, 2010 >>222 July 05, 2010 > 221 January 245,  2012 >>217 10/31/10  > 221  02/19/2011 >213 10/14/2012   HEENT: nl dentition, turbinates, and orophanx. Nl external ear canals without cough reflex  NECK : without JVD/Nodes/TM/ nl carotid upstrokes bilaterally  LUNGS:Exp wheezing bilaterally  CV: RRR no s3 or murmur or increase in P2, no edema  ABD: soft and nontender with nl excursion in the supine position. No bruits or organomegaly, bowel sounds nl, no guarding or rebound.  MS: warm without deformities, calf tenderness, cyanosis or clubbing     Assessment:  Plan:

## 2012-10-14 NOTE — Addendum Note (Signed)
Addended by: Charlott Holler on: 10/14/2012 12:34 PM   Modules accepted: Orders

## 2012-10-20 ENCOUNTER — Encounter: Payer: Self-pay | Admitting: Internal Medicine

## 2012-10-20 ENCOUNTER — Ambulatory Visit (INDEPENDENT_AMBULATORY_CARE_PROVIDER_SITE_OTHER): Payer: Medicare Other | Admitting: Internal Medicine

## 2012-10-20 ENCOUNTER — Ambulatory Visit (INDEPENDENT_AMBULATORY_CARE_PROVIDER_SITE_OTHER)
Admission: RE | Admit: 2012-10-20 | Discharge: 2012-10-20 | Disposition: A | Discharge status: Home / Self Care | Payer: Medicare Other | Source: Ambulatory Visit | Attending: Internal Medicine | Admitting: Internal Medicine

## 2012-10-20 VITALS — BP 142/82 | HR 86 | Temp 98.1°F | Ht 66.0 in | Wt 211.8 lb

## 2012-10-20 DIAGNOSIS — J9610 Chronic respiratory failure, unspecified whether with hypoxia or hypercapnia: Secondary | ICD-10-CM

## 2012-10-20 DIAGNOSIS — J45909 Unspecified asthma, uncomplicated: Secondary | ICD-10-CM

## 2012-10-20 MED ORDER — OMEPRAZOLE 20 MG PO CPDR: DELAYED_RELEASE_CAPSULE | ORAL | Status: DC

## 2012-10-20 MED ORDER — TRAMADOL HCL 50 MG PO TABS: ORAL_TABLET | ORAL | Status: DC

## 2012-10-20 NOTE — Patient Instructions (Addendum)
Please remember to go to the  x-ray department downstairs for your tests - we will call you with the results when they are available.     Take delsym two tsp every 12 hours and supplement if needed with  tramadol 50 mg up to 2 every 4 hours (for cough or pain) to suppress the urge to cough. Swallowing water or using ice chips/non mint and menthol containing candies (such as lifesavers or sugarless jolly ranchers) are also effective.  You should rest your voice and avoid activities that you know make you cough.  Once you have eliminated the cough for 3 straight days try reducing the tramadol first,  then the delsym as tolerated.   prilosec 20 mg Take 30- 60 min before your first and last meals of the day   GERD (REFLUX)  is an extremely common cause of respiratory symptoms, many times with no significant heartburn at all.    It can be treated with medication, but also with lifestyle changes including avoidance of late meals, excessive alcohol, smoking cessation, and avoid fatty foods, chocolate, peppermint, colas, red wine, and acidic juices such as orange juice.  NO MINT OR MENTHOL PRODUCTS SO NO COUGH DROPS  USE SUGARLESS CANDY INSTEAD (jolley ranchers or Stover's)  NO OIL BASED VITAMINS - use powdered substitutes.    See Tammy NP w/in 2 weeks with all your medications, even over the counter meds, separated in two separate bags, the ones you take no matter what vs the ones you stop once you feel better and take only as needed when you feel you need them.   Tammy  will generate for you a new user friendly medication calendar that will put [place] all on the same page re: your medication use.     Without this process, it simply isn't possible to assure that we are providing  your outpatient care  with  the attention to detail we feel you deserve.   If we cannot assure that you're getting that kind of care,  then we cannot manage your problem effectively from this clinic.  Once you have seen Tammy and  we are sure that we're all on the same page with your medication use she will arrange follow up with me.

## 2012-10-20 NOTE — Assessment & Plan Note (Addendum)
Symptoms are markedly disproportionate to objective findings and not clear this is a lung problem but pt does appear to have difficult airway management issues.   DDX of  difficult airways managment all start with A and  include Adherence, Ace Inhibitors, Acid Reflux, Active Sinus Disease, Alpha 1 Antitripsin deficiency, Anxiety masquerading as Airways dz,  ABPA,  allergy(esp in young), Aspiration (esp in elderly), Adverse effects of DPI,  Active smokers, plus two Bs  = Bronchiectasis and Beta blocker use..and one C= CHF  Adherence is always the initial "prime suspect" and is a multilayered concern that requires a "trust but verify" approach in every patient - starting with knowing how to use medications, especially inhalers, correctly, keeping up with refills and understanding the fundamental difference between maintenance and prns vs those medications only taken for a very short course and then stopped and not refilled.  To keep things simple, I have asked the patient to first separate medicines that are perceived as maintenance, that is to be taken daily "no matter what", from those medicines that are taken on only on an as-needed basis and I have given the patient examples of both, and then return to see our NP to generate a  detailed  medication calendar which should be followed until the next physician sees the patient and updates it.    ? Acid reflux > max rx reviewed  ? Anxiety/ compulsive throat clearing (irriatable larynx syndrome masquerading as asthma) > dx of exclusion

## 2012-10-20 NOTE — Assessment & Plan Note (Signed)
-   10/20/2012  Walked RA x 3 laps @ 185 ft each stopped due to  Knees / legs gave out no desat  Adequate control on present rx, reviewed , no need for 02 other than hs for now

## 2012-10-20 NOTE — Progress Notes (Signed)
Subjective:     Patient ID: [name] [name], female   DOB: Jul 03, 1936, 77 y.o.   MRN: [id number]  HPI History of Present Illness:  74 yobf never smoker with known history of " Difficult t-to-control asthma," dating all the way back to the late 1990's typically flares with non-adherence with GERD rx but remains ICS dependent.   April 22, 2009 ov not seen > year doing well on symbicort as needed maybe once daily but once the weather got hot (x sev months) started using symbicort 80 4 puffs in 24 hours and just one albuterol. dyspnea and cough more daytime than night but sometimes it wakes her up at night recently also, no excess mucus.   rec Work on inhaler technique: relax and blow all the way out then take a nice smooth deep breath back in, triggering the inhaler at same time you start breathing in  symbicort 80 2 puffs first thing in am and 2 puffs again in pm about 12 hours later if needed (the second dose is optional)  prevacid 15 mg Take one 30-60 min before first and last meals of the day automatically   June 21, 2009--Returns for follow up and med review. Last visit recommended to add citrucel to regimen however she forgot to go get. Finances play a big role into her meds, she can get generics rx for $5. She was started on prevacid but it is not working. Continues to have upper abd fullness, bloating, and constipation. Her meds were reviewed and pt education was given. no sob or cough   April 07, 2010 Acute visit. Pt c/o increased SOB since the Spring 2011- states that this has been worse x 1 wk with heavy feeling in chest. She gets SOB walking from one room to the next. Heat makes breathing worse. She also c/o dry cough- mainly at bedtime but not waking up once asleep or early in am with resp c/o's . --tx w/ steroid taper.  PAGE 2>>>>>>>>> June 02, 2010 Acute visit. Pt c/o chest tightness and cough x 1 wk- cough is prod with yellow sputum. Has had chills but no fever. She wheezes when  lies down and sometimes with exertion. all this occurred when ran out of ppi assoc with sensation of pnds but no noct awakening or perceived increase need for saba. >>tx steroid taper for exacerbation. and pepcid increased.  July 05, 2010 --Presents for a follow up and med review. She is feeling much better from asthma flare last ov. She has finished her steroid taper. No overt reflux since increasing pepcid two times a day , no longer on PPI. Rare use of Proair since last ov. Her rx coverage is okay , she has not filled rx for diovan or symbicort we sent rx to pharm today to make sure she can afford copay. rec no change in rx   September 20, 2010 ov much worse since ran out of symbicort, didn't know we called it into pharmacy, thinks xopenex twice daily was a reasonable substitute and didn't realize it was already entered on the med cal as a as needed to take in place of proaire, both as needed. Has increase sob, cough and need for saba x several weeks, no purulent sputum.>>no changes, reinforced [facility]   October 31, 2010 --Returns for follow up and med review. Doing well except for tickle in throat w/ drainge also dry cough x 3 days. We reviewd all meds and updated med calendar. Denies chest pain, orthopnea, hemoptysis,  fever, n/v/d, edema, headache.  No discolored mucus, no otc used. Worse at night and in am with cough and throat ticlke. Wind makes worse.   02/19/2011 ov/[place] Chagnon  Cc noct sob, some cough esp w/in a few hours of going to sleep  10/14/2012 Acute OV  Complains of prod cough with occasional yellow mucus, wheezing, increased SOB, tightness in chest x 2-3  months.  Has been to the ER and UC over last 4 weeks. Was given levaquin and steroid taper however did not take this as directed.  Seen by PCP and started on Dulera but did not take this.  Took amoxicillin without any help.  Last abx was 1 week ago.  Did not take any prednisone .  CXR 2/15 -NAD  Previously on symbicort but ran out of this >1  year ago.  Still has wheezing , cough, congestion . Coughing up yellow mucus. Some dyspnea and tightness.  rec Omnicef 300mg  Twice daily  For 7 days w/ food Prednisone taper over next week.  Begin Symbicort 160/4.82mcg 2 puffs Twice daily,  brush/rinse /gargle after use  Prilosec 20mg  daily before meal for heartburn.    10/20/2012 f/u ov/Lavender Stanke cc 50% improved breathing  but then onset of R back pain, positional standing on it makes it worse, no worse coughing or deep breathing. Finishing up prednisone. Sob x across the lobby.  No obvious daytime variabilty or assoc excees mucus or cp or chest tightness, subjective wheeze overt sinus or hb symptoms. No unusual exp hx   Sleeping ok without nocturnal  or early am exacerbation  of respiratory  c/o's or need for noct saba. Also denies any obvious fluctuation of symptoms with weather or environmental changes or other aggravating or alleviating factors except as outlined above  ROS  The following are not active complaints unless bolded sore throat, dysphagia, dental problems, itching, sneezing,  nasal congestion or excess/ purulent secretions, ear ache,   fever, chills, sweats, unintended wt loss, pleuritic or exertional cp, hemoptysis,  orthopnea pnd or leg swelling, presyncope, palpitations, heartburn, abdominal pain, anorexia, nausea, vomiting, diarrhea  or change in bowel or urinary habits, change in stools or urine, dysuria,hematuria,  rash, arthralgias, visual complaints, headache, numbness weakness or ataxia or problems with walking or coordination,  change in mood/affect or memory.         Past Medical History:  ASTHMA (ICD-493.90)  - HFA 25% June 07, 2009 > 75% April 08, 2010 > 90% June 02, 2010 > 90% September 19, 2010 > 90% 02/19/2011  -overnight ox 10/21 2% O2 <88%, cont on on nocturnal O2  GERD clinical dx  OBESITY  Meds reviewed with pt education and computerized med calendar completed/adjusted. redone June 21, 2009  Diabetes  mellitus, type II  Hypertension  Noncritical coronary artery disease by cardiac catheterization in 1999, with branch vessel coronary artery disease affecting the first obtuse marginal branch.  Complex med regimen--Meds reviewed with pt education and computerized med calendar adjusted October 31, 2010            Objective:   Physical Exam  amb obese bf with freq violent throat clearing   wt 222 April 22, 2009 > 224 June 07, 2009 > 218 April 08, 2010 >>221 April 21, 2010 > 224 June 02, 2010 >>222 July 05, 2010 > 221 January 245, 2012 >>217 10/31/10  > 221  02/19/2011 >213 10/14/2012 > 211 10/20/2012   HEENT: nl dentition, turbinates, and orophanx. Nl external ear canals without cough reflex  NECK : without JVD/Nodes/TM/ nl carotid upstrokes bilaterally  LUNGS  Clear to A and P bilaterally  CV: RRR no s3 or murmur or increase in P2, no edema  ABD: soft and nontender with nl excursion in the supine position. No bruits or organomegaly, bowel sounds nl, no guarding or rebound.  MS: warm without deformities, calf tenderness, cyanosis or clubbing   CXR  10/20/2012 :  . No radiographic evidence of acute cardiopulmonary disease.      Assessment:         Plan:

## 2012-10-20 NOTE — Progress Notes (Signed)
Quick Note:  Spoke with pt and notified of results per Dr. Wert. Pt verbalized understanding and denied any questions.  ______ 

## 2012-10-24 ENCOUNTER — Ambulatory Visit: Payer: Medicare Other | Admitting: Internal Medicine

## 2012-11-05 ENCOUNTER — Ambulatory Visit (INDEPENDENT_AMBULATORY_CARE_PROVIDER_SITE_OTHER): Payer: Medicare Other | Admitting: Adult Health

## 2012-11-05 ENCOUNTER — Encounter: Payer: Self-pay | Admitting: Adult Health

## 2012-11-05 VITALS — BP 138/68 | HR 94 | Temp 98.2°F | Ht 66.0 in | Wt 207.2 lb

## 2012-11-05 DIAGNOSIS — H8110 Benign paroxysmal vertigo, unspecified ear: Secondary | ICD-10-CM

## 2012-11-05 DIAGNOSIS — J45909 Unspecified asthma, uncomplicated: Secondary | ICD-10-CM

## 2012-11-05 HISTORY — DX: Benign paroxysmal vertigo, unspecified ear: H81.10

## 2012-11-05 MED ORDER — BUDESONIDE-FORMOTEROL FUMARATE 160-4.5 MCG/ACT IN AERO: 2.0000 | INHALATION_SPRAY | Freq: Two times a day (BID) | RESPIRATORY_TRACT | Status: DC

## 2012-11-05 NOTE — Assessment & Plan Note (Signed)
Recent slow to resolve exacerbation now resolved Patient's medications were reviewed today and patient education was given. Computerized medication calendar was adjusted/completed Cont on current regimen along w/ GERD prevention

## 2012-11-05 NOTE — Addendum Note (Signed)
Addended by: Abigail Miyamoto D on: 11/05/2012 11:11 AM   Modules accepted: Orders

## 2012-11-05 NOTE — Assessment & Plan Note (Signed)
Check ONO on RA  

## 2012-11-05 NOTE — Progress Notes (Signed)
Subjective:     Patient ID: [name] [name], female   DOB: Dec 01, 1935, 77 y.o.   MRN: [id number]  HPI History of Present Illness:  53 yobf never smoker with known history of " Difficult t-to-control asthma," dating all the way back to the late 1990's typically flares with non-adherence with GERD rx but remains ICS dependent.   April 22, 2009 ov not seen > year doing well on symbicort as needed maybe once daily but once the weather got hot (x sev months) started using symbicort 80 4 puffs in 24 hours and just one albuterol. dyspnea and cough more daytime than night but sometimes it wakes her up at night recently also, no excess mucus.   rec Work on inhaler technique: relax and blow all the way out then take a nice smooth deep breath back in, triggering the inhaler at same time you start breathing in  symbicort 80 2 puffs first thing in am and 2 puffs again in pm about 12 hours later if needed (the second dose is optional)  prevacid 15 mg Take one 30-60 min before first and last meals of the day automatically   June 21, 2009--Returns for follow up and med review. Last visit recommended to add citrucel to regimen however she forgot to go get. Finances play a big role into her meds, she can get generics rx for $5. She was started on prevacid but it is not working. Continues to have upper abd fullness, bloating, and constipation. Her meds were reviewed and pt education was given. no sob or cough   April 07, 2010 Acute visit. Pt c/o increased SOB since the Spring 2011- states that this has been worse x 1 wk with heavy feeling in chest. She gets SOB walking from one room to the next. Heat makes breathing worse. She also c/o dry cough- mainly at bedtime but not waking up once asleep or early in am with resp c/o's . --tx w/ steroid taper.  PAGE 2>>>>>>>>> June 02, 2010 Acute visit. Pt c/o chest tightness and cough x 1 wk- cough is prod with yellow sputum. Has had chills but no fever. She wheezes when  lies down and sometimes with exertion. all this occurred when ran out of ppi assoc with sensation of pnds but no noct awakening or perceived increase need for saba. >>tx steroid taper for exacerbation. and pepcid increased.  July 05, 2010 --Presents for a follow up and med review. She is feeling much better from asthma flare last ov. She has finished her steroid taper. No overt reflux since increasing pepcid two times a day , no longer on PPI. Rare use of Proair since last ov. Her rx coverage is okay , she has not filled rx for diovan or symbicort we sent rx to pharm today to make sure she can afford copay. rec no change in rx   September 20, 2010 ov much worse since ran out of symbicort, didn't know we called it into pharmacy, thinks xopenex twice daily was a reasonable substitute and didn't realize it was already entered on the med cal as a as needed to take in place of proaire, both as needed. Has increase sob, cough and need for saba x several weeks, no purulent sputum.>>no changes, reinforced [facility]   October 31, 2010 --Returns for follow up and med review. Doing well except for tickle in throat w/ drainge also dry cough x 3 days. We reviewd all meds and updated med calendar. Denies chest pain, orthopnea, hemoptysis,  fever, n/v/d, edema, headache.  No discolored mucus, no otc used. Worse at night and in am with cough and throat ticlke. Wind makes worse.   02/19/2011 ov/Wert  Cc noct sob, some cough esp w/in a few hours of going to sleep  10/14/2012 Acute OV  Complains of prod cough with occasional yellow mucus, wheezing, increased SOB, tightness in chest x 2-3  months.  Has been to the ER and UC over last 4 weeks. Was given levaquin and steroid taper however did not take this as directed.  Seen by PCP and started on Dulera but did not take this.  Took amoxicillin without any help.  Last abx was 1 week ago.  Did not take any prednisone .  CXR 2/15 -NAD  Previously on symbicort but ran out of this >1  year ago.  Still has wheezing , cough, congestion . Coughing up yellow mucus. Some dyspnea and tightness.  rec Omnicef 300mg  Twice daily  For 7 days w/ food Prednisone taper over next week.  Begin Symbicort 160/4.61mcg 2 puffs Twice daily,  brush/rinse /gargle after use  Prilosec 20mg  daily before meal for heartburn.    10/20/2012 f/u ov/Wert cc 50% improved breathing  but then onset of R back pain, positional standing on it makes it worse, no worse coughing or deep breathing. Finishing up prednisone. Sob x across the lobby. >>delsym/tramadol rx   11/05/2012 Follow up and Med review 2 week rov with med calendar - Cough is 75% better.  Had slow to resolve Asthmatic Bronchitis . Tx w/ multiple abx and steroid taper. Seen 2 weeks started on delsym and tramadol for cough control. Feels better w/ less cough .  Now occas taking Tramadol  . Takes delsym mostly.  Does have some post nasal drip. Does have a tickle in throat with dryness.  No wheezing , hemoptysis , chest pain or edema.  Wants to get off oxygen at night . We discussed ONO testing on RA.  She does complain of 1 week of dizziness, no visual/speech, or chest pain. No arm weakness.           Past Medical History:  ASTHMA (ICD-493.90)  - HFA 25% June 07, 2009 > 75% April 08, 2010 > 90% June 02, 2010 > 90% September 19, 2010 > 90% 02/19/2011  -overnight ox 10/21 2% O2 <88%, cont on on nocturnal O2  GERD clinical dx  OBESITY  Diabetes mellitus, type II  Hypertension  Noncritical coronary artery disease by cardiac catheterization in 1999, with branch vessel coronary artery disease affecting the first obtuse marginal branch.  Complex med regimen--Meds reviewed with pt education and computerized med calendar adjusted October 31, 2010 , 11/05/2012      ROS  Constitutional:   No  weight loss, night sweats,  Fevers, chills, fatigue, or  lassitude.  HEENT:   No headaches,  Difficulty swallowing,  Tooth/dental problems, or  Sore throat,                 No sneezing, itching, ear ache,  +nasal congestion, post nasal drip,   CV:  No chest pain,  Orthopnea, PND, swelling in lower extremities, anasarca, dizziness, palpitations, syncope.   GI  No heartburn, indigestion, abdominal pain, nausea, vomiting, diarrhea, change in bowel habits, loss of appetite, bloody stools.   Resp: No shortness of breath with exertion or at rest.  No excess mucus, no productive cough,     No coughing up of blood.  No change in color of  mucus.  No wheezing.  No chest wall deformity  Skin: no rash or lesions.  GU: no dysuria, change in color of urine, no urgency or frequency.  No flank pain, no hematuria   MS:  No joint pain or swelling.  No decreased range of motion.  No back pain.  Psych:  No change in mood or affect. No depression or anxiety.  No memory loss.           Objective:   Physical Exam  amb obese bf     wt 222 April 22, 2009 > 224 June 07, 2009 > 218 April 08, 2010 >>221 April 21, 2010 > 224 June 02, 2010 >>222 July 05, 2010 > 221 January 245, 2012 >>217 10/31/10  > 221  02/19/2011 >213 10/14/2012 > 211 10/20/2012 , 207 11/05/2012   HEENT: nl dentition, turbinates, and oro phanx. Nl external ear canals without cough reflex  NECK : without JVD/Nodes/TM/ nl carotid upstrokes bilaterally  LUNGS  Clear to A and P bilaterally  CV: RRR no s3 or murmur or increase in P2, no edema  ABD: soft and nontender with nl excursion in the supine position. No bruits or organomegaly, bowel sounds nl, no guarding or rebound.  MS: warm without deformities, calf tenderness, cyanosis or clubbing   CXR  10/20/2012 :  . No radiographic evidence of acute cardiopulmonary disease.      Assessment:         Plan:

## 2012-11-05 NOTE — Patient Instructions (Addendum)
Follow med calendar closely and bring to each office visit. Make an appointment with your family doctor concerning your dizziness We're setting you up for an overnight oximetry test. Follow up Dr. Sherene Sires in 2 months and as needed. Please contact office for sooner follow up if symptoms do not improve or worsen or seek emergency care

## 2012-11-05 NOTE — Assessment & Plan Note (Signed)
?  Vertigo  Advised to see PCP regarding persistent dizziness Offered to make ov however pt declined wants to call them herself.

## 2012-11-06 ENCOUNTER — Telehealth: Payer: Self-pay | Admitting: Internal Medicine

## 2012-11-06 NOTE — Telephone Encounter (Signed)
I spoke with Libby[facility]; she is cancelling the ONO order through [facility] and will send to [facility]. Pt is aware of update.

## 2012-11-10 NOTE — Addendum Note (Signed)
Addended by: Boone Master E on: 11/10/2012 03:14 PM   Modules accepted: Orders

## 2012-11-12 ENCOUNTER — Ambulatory Visit (INDEPENDENT_AMBULATORY_CARE_PROVIDER_SITE_OTHER): Payer: Medicare Other | Admitting: Internal Medicine

## 2012-11-12 ENCOUNTER — Encounter: Payer: Self-pay | Admitting: Internal Medicine

## 2012-11-12 VITALS — BP 162/88 | HR 87 | Temp 97.6°F | Ht 67.0 in | Wt 210.4 lb

## 2012-11-12 DIAGNOSIS — R42 Dizziness and giddiness: Secondary | ICD-10-CM | POA: Insufficient documentation

## 2012-11-12 LAB — POCT GLYCOSYLATED HEMOGLOBIN (HGB A1C): Hemoglobin A1C: 6.9

## 2012-11-12 LAB — GLUCOSE, CAPILLARY: Glucose-Capillary: 234 mg/dL — ABNORMAL HIGH (ref 70–99)

## 2012-11-12 MED ORDER — CARBAMIDE PEROXIDE 6.5 % OT SOLN: 5.0000 [drp] | Freq: Two times a day (BID) | OTIC | Status: DC

## 2012-11-12 NOTE — Assessment & Plan Note (Addendum)
x1 month.  Occasional room spinning feeling when waking up in the morning.  Feels like has to hold on to something at times to keep steady.  No falls or fainting reported.  No pre-syncopal episodes.  Complains of ear fullness and problems with ear wax in the past that lead to similar symptoms.  Right ear hearing is less than left.  Visible cerumen impaction in b/l ears.  No vertigo noted on head positioning and neuro exam unremarkable.    -b/l ear mild irrigation, as she has hx of pins in b/l ears and has gotten them irrigated in the past with ENT but she does not wish to return there at this time.   -will prescribe debrox ear solution up to 4 days to be used only if no improvement in symptoms and ear wax returns -if symptoms do not improve, will need to refer back to ENT -no orthostasis noted on visit and vital check but BP was elevated than usual

## 2012-11-12 NOTE — Patient Instructions (Addendum)
Please try the ear drops 5-10 drops in each ear up to twice a day for NO MORE THAN 4 DAYS  If you notice no improvement, ear pain, worsening with your hearing, we will need to refer you back to ENT  Please continue to take your medications and monitor your blood pressure at home  Call clinic [phone] with any questions or concerns  Take your time standing up from seated position.  If you notice worsening of dizziness call or come to clinic.  If you notice any sudden weakness or numbness and tingling, go directly to ED  We have done foot exam today and referred you for your eye exam  Please consider getting your TDAP vaccine next visit which you refused today  Cerumen Impaction A cerumen impaction is when the wax in your ear forms a plug. This plug usually causes reduced hearing. Sometimes it also causes an earache or dizziness. Removing a cerumen impaction can be difficult and painful. The wax sticks to the ear canal. The canal is sensitive and bleeds easily. If you try to remove a heavy wax buildup with a cotton tipped swab, you may push it in further. Irrigation with water, suction, and small ear curettes may be used to clear out the wax. If the impaction is fixed to the skin in the ear canal, ear drops may be needed for a few days to loosen the wax. People who build up a lot of wax frequently can use ear wax removal products available in your local drugstore. SEEK MEDICAL CARE IF:  You develop an earache, increased hearing loss, or marked dizziness. Document Released: 09/20/2004 Document Revised: 11/05/2011 Document Reviewed: 11/10/2009 [employer] Patient Information 2013 [place], [place].

## 2012-11-12 NOTE — Assessment & Plan Note (Signed)
Refused tdap vaccine today but will consider for next visit.  Foot exam done today.  Referred to optho today.

## 2012-11-12 NOTE — Assessment & Plan Note (Signed)
Lab Results  Component Value Date   HGBA1C 6.9 11/12/2012   HGBA1C 6.8 06/24/2012   HGBA1C 6.7 03/26/2012    Assessment:  Diabetes control: good control (HgbA1C at goal)  Progress toward A1C goal:     Plan:  Medications:  continue current medications metformin 500mg  bid and humalog 75/25 18 units qhs  Home glucose monitoring:   Frequency: 3 times a day   Timing:    Instruction/counseling given: reminded to get eye exam, reminded to bring blood glucose meter & log to each visit, reminded to bring medications to each visit, discussed foot care, discussed the need for weight loss and discussed diet  Educational resources provided: brochure  Self management tools provided:    Other plans: foot exam done today and referred for eye exam

## 2012-11-12 NOTE — Progress Notes (Signed)
Subjective:   Patient ID: [name] [name] female   DOB: 05-Sep-1935 77 y.o.   MRN: [id number]  HPI: [name] [name] is a 77 y.o. African American female with PMH of asthma (follows with Dr. Sherene Sires), DM2 (well controlled), cervical and lumbar spondylosis, and HTN presenting to clinic today for acute visit with complaints of 1 month of light-headedness.  [name] [name] claims she has been battling a bad URI for approximately 1 month now that is only now improving and she has seen Dr. Sherene Sires this week for continued monitoring.  Around the same time she reports having feeling of light-headedness and 1 episode of room spinning upon waking up in the morning without sitting up.  She reports ear fullness and similar sensation in the past when she was noted to have cerumen impaction that had to get irrigated.  At that time, she went to ENT and does not wish to return there at his time.  She has had pins placed in b/l ears before.  Currently, she reports no pain in ears, but feeling of fullness and decreased hearing, more on the right.  She has occasional headaches but denies any fainting, near syncopal episodes, and change in dizziness with position.  Orthostatic vital signs were negative in clinic today.  She has been eating well and drinking fluids.  Her BP was elevated today more than usual she claims.    Otherwise, she currently denies any fever, chills, N/V/D, chest pain, shortness of breath, abdominal pain, or any urinary complaints.  She does complain of occasional tingling in her left arm and pain in left shoulder which is chronic and not new.    Past Medical History  Diagnosis Date  . Asthma   . GERD (gastroesophageal reflux disease)   . Hypertension   . CAD (coronary artery disease)   . Obesity   . Diabetes mellitus   . Cervical spondylosis 03/26/2012   Current Outpatient Prescriptions  Medication Sig Dispense Refill  . albuterol (PROVENTIL HFA;VENTOLIN HFA) 108 (90 BASE) MCG/ACT inhaler 2 puffs  every 4-6 hours as needed for wheezing/shortness of breath      . budesonide-formoterol (SYMBICORT) 160-4.5 MCG/ACT inhaler Inhale 2 puffs into the lungs 2 (two) times daily.  1 Inhaler  0  . CALCIUM-VITAMIN D PO Take 1 tablet by mouth 2 (two) times daily.      Marland Kitchen Dextromethorphan-Guaifenesin (MUCINEX DM) 30-600 MG TB12 Take 1-2 every 12 hours as needed      . Docusate Calcium (STOOL SOFTENER PO) Take 1-2 at bedtime as needed      . insulin lispro protamine-insulin lispro (HUMALOG MIX 75/25) (75-25) 100 UNIT/ML SUSP Inject 18 Units into the skin at bedtime.      . metFORMIN (GLUCOPHAGE) 500 MG tablet Take 500 mg by mouth 2 (two) times daily with a meal.       . omeprazole (PRILOSEC) 20 MG capsule Take 30- 60 min before your first and last meals of the day  60 capsule  11  . Probiotic Product (PROBIOTIC DAILY PO) Take 1 tablet by mouth daily.      . Simethicone (GAS-X PO) Take with meals      . simvastatin (ZOCOR) 40 MG tablet Take 40 mg by mouth at bedtime.      . valsartan-hydrochlorothiazide (DIOVAN-HCT) 160-12.5 MG per tablet Take 1 tablet by mouth daily.      . Vitamin D, Ergocalciferol, (DRISDOL) 50000 UNITS CAPS 1 capsule every Tuesday and Friday      .  carbamide peroxide (DEBROX) 6.5 % otic solution Place 5 drops into both ears 2 (two) times daily. Up to 4 days  15 mL  0  . Cetirizine HCl 10 MG CAPS Take 1 capsule by mouth at bedtime as needed.      Marland Kitchen NASAL SPRAY SALINE NA Rinse as needed      . Polyethylene Glycol 3350 (MIRALAX PO) Take 1 capsule twice daily as needed      . traMADol (ULTRAM) 50 MG tablet 1 every 4 hours as needed for cough or pain       No current facility-administered medications for this visit.   Family History  Problem Relation Age of Onset  . Cancer Mother     Question pancreatic  . Coronary artery disease Father   . Breast cancer Sister 37  . Coronary artery disease Sister 2  . Colon cancer Neg Hx    History   Social History  . Marital Status: Married     Spouse Name: N/A    Number of Children: N/A  . Years of Education: N/A   Social History Main Topics  . Smoking status: Never Smoker   . Smokeless tobacco: Never Used  . Alcohol Use: No  . Drug Use: No  . Sexually Active: None   Other Topics Concern  . None   Social History Narrative   Patient is retired   2 children   Review of Systems: Constitutional: Denies fever, chills, diaphoresis, appetite change and fatigue.  HEENT: right ear decreased hearing, ear wax buildup bilaterally. Denies photophobia, eye pain, redness, ear pain, congestion, sore throat, rhinorrhea, sneezing, mouth sores, trouble swallowing, neck pain, neck stiffness and tinnitus.   Respiratory: Denies SOB, DOE, cough, chest tightness,  and wheezing.   Cardiovascular: Denies chest pain, palpitations and leg swelling.  Gastrointestinal: Denies nausea, vomiting, abdominal pain, diarrhea, constipation, blood in stool and abdominal distention.  Genitourinary: Denies dysuria, urgency, frequency, hematuria, flank pain and difficulty urinating.  Musculoskeletal: Left should pain and tingling in left hand.  Denies myalgias, back pain, joint swelling, and gait problem.  Skin: Denies pallor, rash and wound.  Neurological: light-headedness and occasional headaches in AM.  Denies dizziness, seizures, syncope, weakness, numbness.   Hematological: Denies adenopathy. Easy bruising, personal or family bleeding history  Psychiatric/Behavioral: Denies suicidal ideation, mood changes, confusion, nervousness, sleep disturbance and agitation  Objective:  Physical Exam: Filed Vitals:   11/12/12 0915 11/12/12 0930 11/12/12 0932 11/12/12 0934  BP: 160/74 153/77 163/88 162/88  Pulse: 89 91 84 87  Temp: 97.6 F (36.4 C)     TempSrc: Oral     Height: 5\' 7"  (1.702 m)     Weight: 210 lb 6.4 oz (95.437 kg)     SpO2: 99%      Constitutional: Vital signs reviewed.  Patient is a well-developed and well-nourished female in mild distress  due to complaints of light-headedness and cooperative with exam. Alert and oriented x3.  Head: Normocephalic and atraumatic Ear: b/l cerumen impaction.  Decreased hearing in right ear compared to left on whisper test.  No tenderness to palpation of mastoid and tragus. Post irrigation: no pus or mass visible, moist.   Mouth: no erythema or exudates, MMM Eyes: PERRL, EOMI, conjunctivae normal, No scleral icterus.  Neck: Supple, Trachea midline normal ROM Cardiovascular: RRR, S1 normal, S2 normal, no MRG, pulses symmetric and intact bilaterally Pulmonary/Chest: CTAB, no wheezes, rales, or rhonchi Abdominal: Soft. Non-tender, non-distended, bowel sounds are normal, no masses, organomegaly, or guarding present.  GU: no CVA tenderness Musculoskeletal: No joint deformities, erythema, or stiffness, ROM full and no nontender Hematology: no cervical, inginal, or axillary adenopathy.  Neurological: A&O x3, Strength is normal and symmetric bilaterally, cranial nerve II-XII are grossly intact, no focal motor deficit, sensory intact to light touch bilaterally. cerebellar exam with no abnormalities noted: negative romberg's, no dysdiadochokinesia, gait stable, no deficit noted on finger to nose.  No nystagmus noted.  Does complain of mild light-headedness upon standing.   Skin: Warm, dry and intact. No rash, cyanosis, or clubbing.  Psychiatric: Normal mood and affect. speech and behavior is normal. Judgment and thought content normal. Cognition and memory are normal.   Assessment & Plan:  Discussed with Dr. Kendra Opitz impaction/light-headed: b/l ear irrigation in clinic and debrox ear ointment if no relief or improvement.  May need ENT referral with hx of pins in b/l ears and prior referral. HTN--elevated today, possibly secondary to distress and walking to clinic.  Continue to monitor.

## 2012-11-12 NOTE — Assessment & Plan Note (Signed)
Complains of ear fullness and problems with ear wax in the past that lead to similar symptoms.  Right ear hearing is less than left.  Visible cerumen impaction in b/l ears.  No vertigo noted on head positioning and neuro exam unremarkable.  Positive whisper test in right ear with decrease hearing in right compared to left.   -b/l ear mild irrigation, as she has hx of pins in b/l ears and has gotten them irrigated in the past with ENT but she does not wish to return there at this time.   -will prescribe debrox ear solution up to 4 days to be used only if no improvement in symptoms and ear wax returns -if symptoms do not improve, will need to refer back to ENT

## 2012-12-18 ENCOUNTER — Other Ambulatory Visit: Payer: Self-pay

## 2012-12-18 DIAGNOSIS — Z1231 Encounter for screening mammogram for malignant neoplasm of breast: Secondary | ICD-10-CM

## 2013-01-01 ENCOUNTER — Ambulatory Visit
Admission: RE | Admit: 2013-01-01 | Discharge: 2013-01-01 | Disposition: A | Discharge status: Home / Self Care | Payer: Medicare Other | Source: Ambulatory Visit

## 2013-01-01 DIAGNOSIS — Z1231 Encounter for screening mammogram for malignant neoplasm of breast: Secondary | ICD-10-CM

## 2013-01-09 ENCOUNTER — Ambulatory Visit (INDEPENDENT_AMBULATORY_CARE_PROVIDER_SITE_OTHER): Payer: Medicare Other | Admitting: Internal Medicine

## 2013-01-09 VITALS — BP 120/78 | HR 78 | Wt 207.0 lb

## 2013-01-09 DIAGNOSIS — J45909 Unspecified asthma, uncomplicated: Secondary | ICD-10-CM

## 2013-01-09 DIAGNOSIS — J9610 Chronic respiratory failure, unspecified whether with hypoxia or hypercapnia: Secondary | ICD-10-CM

## 2013-01-09 MED ORDER — ALBUTEROL SULFATE HFA 108 (90 BASE) MCG/ACT IN AERS: 2.0000 | INHALATION_SPRAY | Freq: Four times a day (QID) | RESPIRATORY_TRACT | Status: DC | PRN

## 2013-01-09 MED ORDER — BUDESONIDE-FORMOTEROL FUMARATE 160-4.5 MCG/ACT IN AERO: 2.0000 | INHALATION_SPRAY | Freq: Two times a day (BID) | RESPIRATORY_TRACT | Status: DC

## 2013-01-09 NOTE — Progress Notes (Signed)
Subjective:     Patient ID: [name] [name], female   DOB: [birth year]    MRN: [id number]  Brief patient profile:   63 yobf never smoker with known history of " Difficult t-to-control asthma," dating all the way back to the late 1990's typically flares with non-adherence with GERD rx but remains ICS dependent.    10/14/2012 Acute OV  Complains of prod cough with occasional yellow mucus, wheezing, increased SOB, tightness in chest x 2-3  months.  Has been to the ER and UC over last 4 weeks. Was given levaquin and steroid taper however did not take this as directed.  Seen by PCP and started on Dulera but did not take this.  Took amoxicillin without any help.  Last abx was 1 week ago.  Did not take any prednisone .  CXR 2/15 -NAD  Previously on symbicort but ran out of this >1 year ago.  Still has wheezing , cough, congestion . Coughing up yellow mucus. Some dyspnea and tightness.  rec Omnicef 300mg  Twice daily  For 7 days w/ food Prednisone taper over next week.  Begin Symbicort 160/4.38mcg 2 puffs Twice daily,  brush/rinse /gargle after use  Prilosec 20mg  daily before meal for heartburn.    10/20/2012 f/u ov/Wert cc 50% improved breathing  but then onset of R back pain, positional standing on it makes it worse, no worse coughing or deep breathing. Finishing up prednisone. Sob x across the lobby. >>delsym/tramadol rx   11/05/2012 Follow up and Med review 2 week rov with med calendar - Cough is 75% better.  Had slow to resolve Asthmatic Bronchitis . Tx w/ multiple abx and steroid taper. Seen 2 weeks started on delsym and tramadol for cough control. Feels better w/ less cough .  Now occas taking Tramadol  . Takes delsym mostly.  Does have some post nasal drip. Does have a tickle in throat with dryness.  No wheezing , hemoptysis , chest pain or edema.  Wants to get off oxygen at night . We discussed ONO testing on RA.  She does complain of 1 week of dizziness, no visual/speech, or chest  pain. No arm weakness.  rec No change rx     01/09/2013 f/u ov/Wert re asthma doing better with med calendar Chief Complaint  Patient presents with  . Follow-up    Pt states that her cough continues to improve. No new co's today.    has never had ono RA as requested and does not use 02 at this point. No limiting sob and min cough, not requiring daytime saba or cough suppression at all at this point  No obvious daytime variabilty or assoc  cp or chest tightness, subjective wheeze overt sinus or hb symptoms. No unusual exp hx or h/o childhood pna/ asthma or premature birth to her knowledge.   Sleeping ok without nocturnal  or early am exacerbation  of respiratory  c/o's or need for noct saba. Also denies any obvious fluctuation of symptoms with weather or environmental changes or other aggravating or alleviating factors except as outlined above   Current Medications, Allergies,   Past Surgical History, Family History, and Social History were reviewed in [employer] record.  ROS  The following are not active complaints unless bolded sore throat, dysphagia, dental problems, itching, sneezing,  nasal congestion or excess/ purulent secretions, ear ache,   fever, chills, sweats, unintended wt loss, pleuritic or exertional cp, hemoptysis,  orthopnea pnd or leg swelling, presyncope, palpitations, heartburn, abdominal  pain, anorexia, nausea, vomiting, diarrhea  or change in bowel or urinary habits, change in stools or urine, dysuria,hematuria,  rash, arthralgias, visual complaints, headache, numbness weakness or ataxia or problems with walking or coordination,  change in mood/affect or memory.              Past Medical History:  ASTHMA (ICD-493.90)  - HFA 25% June 07, 2009 > 75% April 08, 2010 > 90% June 02, 2010 > 90% September 19, 2010 > 90% 02/19/2011  -overnight ox 10/21 2% O2 <88%, cont on on nocturnal O2  GERD clinical dx  OBESITY  Diabetes mellitus, type II   Hypertension  Noncritical coronary artery disease by cardiac catheterization in 1999, with branch vessel coronary artery disease affecting the first obtuse marginal branch.  Complex med regimen--Meds reviewed with pt education and computerized med calendar adjusted October 31, 2010 , 11/05/2012               Objective:   Physical Exam  amb obese bf     wt 222 April 22, 2009 > 224 June 07, 2009 > 218 April 08, 2010 >>221 April 21, 2010 > 224 June 02, 2010 >>222 July 05, 2010 > 221 January 245, 2012 >>217 10/31/10  > 221  02/19/2011 >213 10/14/2012 > 211 10/20/2012 , 207 11/05/2012 > 207 01/09/2013   HEENT: nl dentition, turbinates, and oro phanx. Nl external ear canals without cough reflex  NECK : without JVD/Nodes/TM/ nl carotid upstrokes bilaterally  LUNGS  Clear to A and P bilaterally  CV: RRR no s3 or murmur or increase in P2, no edema  ABD: soft and nontender with nl excursion in the supine position. No bruits or organomegaly, bowel sounds nl, no guarding or rebound.  MS: warm without deformities, calf tenderness, cyanosis or clubbing   CXR  10/20/2012 :   No radiographic evidence of acute cardiopulmonary disease.      Assessment:         Plan:   Outpatient Encounter Prescriptions as of 01/09/2013  Medication Sig Dispense Refill  . budesonide-formoterol (SYMBICORT) 160-4.5 MCG/ACT inhaler Inhale 2 puffs into the lungs 2 (two) times daily.  1 Inhaler  11  . CALCIUM-VITAMIN D PO Take 1 tablet by mouth 2 (two) times daily.      . Cetirizine HCl 10 MG CAPS Take 1 capsule by mouth at bedtime as needed.      . clonazePAM (KLONOPIN) 1 MG tablet Take 1 mg by mouth 2 (two) times daily as needed.      Marland Kitchen Dextromethorphan-Guaifenesin (MUCINEX DM) 30-600 MG TB12 Take 1-2 every 12 hours as needed      . Docusate Calcium (STOOL SOFTENER PO) Take 1-2 at bedtime as needed      . insulin lispro protamine-insulin lispro (HUMALOG MIX 75/25) (75-25) 100 UNIT/ML SUSP Inject 18 Units into  the skin at bedtime.      . metFORMIN (GLUCOPHAGE) 500 MG tablet Take 500 mg by mouth 2 (two) times daily with a meal.       . NASAL SPRAY SALINE NA Rinse as needed      . omeprazole (PRILOSEC) 20 MG capsule Take 30- 60 min before your first and last meals of the day  60 capsule  11  . Polyethylene Glycol 3350 (MIRALAX PO) Take 1 capsule twice daily as needed      . predniSONE (DELTASONE) 20 MG tablet Take 20 mg by mouth daily.      . Probiotic Product (PROBIOTIC DAILY PO)  Take 1 tablet by mouth daily.      . simvastatin (ZOCOR) 40 MG tablet Take 40 mg by mouth at bedtime.      . traMADol (ULTRAM) 50 MG tablet 1 every 4 hours as needed for cough or pain      . valsartan-hydrochlorothiazide (DIOVAN-HCT) 160-12.5 MG per tablet Take 1 tablet by mouth daily.      . Vitamin D, Ergocalciferol, (DRISDOL) 50000 UNITS CAPS 1 capsule every Tuesday and Friday      . [DISCONTINUED] albuterol (PROVENTIL HFA;VENTOLIN HFA) 108 (90 BASE) MCG/ACT inhaler 2 puffs every 4-6 hours as needed for wheezing/shortness of breath      . [DISCONTINUED] budesonide-formoterol (SYMBICORT) 160-4.5 MCG/ACT inhaler Inhale 2 puffs into the lungs 2 (two) times daily.  1 Inhaler  0  . albuterol (PROAIR HFA) 108 (90 BASE) MCG/ACT inhaler Inhale 2 puffs into the lungs every 6 (six) hours as needed for wheezing. 2 puffs every 4 hours as needed only  if your can't catch your breath  1 Inhaler  2  . [DISCONTINUED] carbamide peroxide (DEBROX) 6.5 % otic solution Place 5 drops into both ears 2 (two) times daily. Up to 4 days  15 mL  0  . [DISCONTINUED] Simethicone (GAS-X PO) Take with meals       No facility-administered encounter medications on file as of 01/09/2013.

## 2013-01-09 NOTE — Patient Instructions (Addendum)
See calendar for specific medication instructions and bring it back for each and every office visit for every healthcare provider you see.  Without it,  you may not receive the best quality medical care that we feel you deserve.  You will note that the calendar groups together  your maintenance  medications that are timed at particular times of the day.  Think of this as your checklist for what your doctor has instructed you to do until your next evaluation to see what benefit  there is  to staying on a consistent group of medications intended to keep you well.  The other group at the bottom is entirely up to you to use as you see fit  for specific symptoms that may arise between visits that require you to treat them on an as needed basis.  Think of this as your action plan or "what if" list.   We will have the company check your 02 sats overnight and let you know how you did  Separating the top medications from the bottom group is fundamental to providing you adequate care going forward.    If you are satisfied with your treatment plan let your doctor know and he/she can either refill your medications or you can return here when your prescription runs out.     If in any way you are not 100% satisfied,  please tell return with all medications in 2 bags and your med calendar.    If 100% better, tell your friends!

## 2013-01-11 ENCOUNTER — Encounter: Payer: Self-pay | Admitting: Internal Medicine

## 2013-01-11 NOTE — Assessment & Plan Note (Signed)
-   10/20/2012  Walked RA x 3 laps @ 185 ft each stopped due to  Knees / legs gave out no desat - 01/09/2013  Walked RA x 3 laps @ 185 ft each stopped due to  End of study, sats 96% -ONO RA  Re-ordered   01/09/2013 >>  Strongly doubt she is 02 dep in any way at this point > repeat ono ordered

## 2013-01-11 NOTE — Assessment & Plan Note (Signed)
The proper method of use, as well as anticipated side effects, of a metered-dose inhaler are discussed and demonstrated to the patient. Improved effectiveness after extensive coaching during this visit to a level of approximately  90%  All goals of chronic asthma control met including optimal function and elimination of symptoms with minimal need for rescue therapy.  Contingencies discussed in full including contacting this office immediately if not controlling the symptoms using the rule of two's.       Each maintenance medication was reviewed in detail including most importantly the difference between maintenance and as needed and under what circumstances the prns are to be used. This was done in the context of a medication calendar review which provided the patient with a user-friendly unambiguous mechanism for medication administration and reconciliation and provides an action plan for all active problems. It is critical that this be shown to every doctor  for modification during the office visit if necessary so the patient can use it as a working document.

## 2013-01-20 ENCOUNTER — Encounter: Payer: Self-pay | Admitting: Internal Medicine

## 2013-01-21 ENCOUNTER — Telehealth: Payer: Self-pay | Admitting: Internal Medicine

## 2013-01-21 NOTE — Telephone Encounter (Signed)
Per MW ONO on RA shows that the pt does need to continue o2 at [facility] only Spoke with pt and notified of results per Dr. Sherene Sires. Pt verbalized understanding and denied any questions.

## 2013-03-05 ENCOUNTER — Other Ambulatory Visit: Payer: Self-pay

## 2013-03-12 NOTE — Addendum Note (Signed)
Addended by: Hassan Buckler on: 03/12/2013 09:52 AM   Modules accepted: Orders

## 2013-03-31 ENCOUNTER — Ambulatory Visit (INDEPENDENT_AMBULATORY_CARE_PROVIDER_SITE_OTHER): Payer: Medicare Other | Admitting: Internal Medicine

## 2013-03-31 ENCOUNTER — Ambulatory Visit (HOSPITAL_COMMUNITY)
Admission: RE | Admit: 2013-03-31 | Discharge: 2013-03-31 | Disposition: A | Discharge status: Home / Self Care | Payer: Medicare Other | Source: Ambulatory Visit | Attending: Internal Medicine | Admitting: Internal Medicine

## 2013-03-31 ENCOUNTER — Encounter: Payer: Self-pay | Admitting: Internal Medicine

## 2013-03-31 VITALS — BP 140/74 | HR 88 | Temp 98.2°F | Resp 20 | Ht 67.0 in | Wt 210.3 lb

## 2013-03-31 DIAGNOSIS — I499 Cardiac arrhythmia, unspecified: Secondary | ICD-10-CM

## 2013-03-31 DIAGNOSIS — H6121 Impacted cerumen, right ear: Secondary | ICD-10-CM

## 2013-03-31 DIAGNOSIS — R002 Palpitations: Secondary | ICD-10-CM

## 2013-03-31 DIAGNOSIS — E119 Type 2 diabetes mellitus without complications: Secondary | ICD-10-CM

## 2013-03-31 DIAGNOSIS — R9431 Abnormal electrocardiogram [ECG] [EKG]: Secondary | ICD-10-CM | POA: Insufficient documentation

## 2013-03-31 DIAGNOSIS — H6120 Impacted cerumen, unspecified ear: Secondary | ICD-10-CM

## 2013-03-31 LAB — LIPID PANEL
Cholesterol: 161 mg/dL (ref 0–200)
Triglycerides: 74 mg/dL (ref <150)
VLDL: 15 mg/dL (ref 0–40)

## 2013-03-31 LAB — TSH: TSH: 2.276 u[IU]/mL (ref 0.350–4.500)

## 2013-03-31 MED ORDER — INSULIN LISPRO PROT & LISPRO (75-25 MIX) 100 UNIT/ML ~~LOC~~ SUSP: 15.0000 [IU] | Freq: Every day | SUBCUTANEOUS | Status: DC

## 2013-03-31 NOTE — Progress Notes (Deleted)
This is a Psychologist, occupational Note.  The care of the patient was discussed with Dr. Marland Kitchen and the assessment and plan was formulated with their assistance.  Please see their note for official documentation of the patient encounter.   Subjective:   Patient ID: [name] [name] female   DOB: [birth year] 77 y.o.   MRN: [id number]  HPI: [name] [name] is a 77 y.o.     Past Medical History  Diagnosis Date  . Asthma   . GERD (gastroesophageal reflux disease)   . Hypertension   . CAD (coronary artery disease)   . Obesity   . Diabetes mellitus   . Cervical spondylosis 03/26/2012   Current Outpatient Prescriptions  Medication Sig Dispense Refill  . budesonide-formoterol (SYMBICORT) 160-4.5 MCG/ACT inhaler Inhale 2 puffs into the lungs 2 (two) times daily.  1 Inhaler  11  . clonazePAM (KLONOPIN) 1 MG tablet Take 1 mg by mouth 2 (two) times daily as needed.      Tery Sanfilippo Calcium (STOOL SOFTENER PO) Take 1-2 at bedtime as needed      . insulin lispro protamine-lispro (HUMALOG MIX 75/25) (75-25) 100 UNIT/ML SUSP injection Inject 15 Units into the skin daily with supper.  10 mL  0  . metFORMIN (GLUCOPHAGE) 500 MG tablet Take 500 mg by mouth 2 (two) times daily with a meal.       . omeprazole (PRILOSEC) 20 MG capsule Take 30- 60 min before your first and last meals of the day  60 capsule  11  . Probiotic Product (PROBIOTIC DAILY PO) Take 1 tablet by mouth daily.      . simvastatin (ZOCOR) 40 MG tablet Take 40 mg by mouth at bedtime.      . valsartan-hydrochlorothiazide (DIOVAN-HCT) 160-12.5 MG per tablet Take 1 tablet by mouth daily.      . Vitamin D, Ergocalciferol, (DRISDOL) 50000 UNITS CAPS 1 capsule every Tuesday and Friday      . albuterol (PROAIR HFA) 108 (90 BASE) MCG/ACT inhaler Inhale 2 puffs into the lungs every 6 (six) hours as needed for wheezing. 2 puffs every 4 hours as needed only  if your can't catch your breath  1 Inhaler  2  . CALCIUM-VITAMIN D PO Take 1 tablet by mouth 2  (two) times daily.      . Cetirizine HCl 10 MG CAPS Take 1 capsule by mouth at bedtime as needed.      Marland Kitchen Dextromethorphan-Guaifenesin (MUCINEX DM) 30-600 MG TB12 Take 1-2 every 12 hours as needed      . NASAL SPRAY SALINE NA Rinse as needed      . Polyethylene Glycol 3350 (MIRALAX PO) Take 1 capsule twice daily as needed      . predniSONE (DELTASONE) 20 MG tablet Take 20 mg by mouth daily.      . traMADol (ULTRAM) 50 MG tablet 1 every 4 hours as needed for cough or pain       No current facility-administered medications for this visit.   Family History  Problem Relation Age of Onset  . Cancer Mother     Question pancreatic  . Coronary artery disease Father   . Breast cancer Sister 76  . Coronary artery disease Sister 14  . Colon cancer Neg Hx    History   Social History  . Marital Status: Married    Spouse Name: N/A    Number of Children: N/A  . Years of Education: N/A   Social History Main Topics  .  Smoking status: Never Smoker   . Smokeless tobacco: Never Used  . Alcohol Use: No  . Drug Use: No  . Sexually Active: None   Other Topics Concern  . None   Social History Narrative   Patient is retired   2 children   Review of Systems: {Review Of Systems:30496} Objective:  Physical Exam: Filed Vitals:   03/31/13 1537  BP: 140/74  Pulse: 88  Temp: 98.2 F (36.8 C)  TempSrc: Oral  Resp: 20  Height: 5\' 7"  (1.702 m)  Weight: 210 lb 4.8 oz (95.391 kg)  SpO2: 97%   {Exam, Complete:17964} Assessment & Plan:  1. ***

## 2013-03-31 NOTE — Patient Instructions (Signed)
Please return to your appointment with your endocrinologist on 8/13 @ 8:15AM (Dr. Meredith Pel).   Please take only 15 units of Humalog 70/30 before your supper. Do not take 18 units in the evening.   We will contact you with any abnormal lab values from your appointment today.

## 2013-03-31 NOTE — Progress Notes (Signed)
Patient ID: [name] [name], female   DOB: 07-18-1936, 77 y.o.   MRN: [id number]     This is a Medical Student Note.  The care of the patient was discussed with Dr. Rogelia Boga and the assessment and plan was formulated with their assistance.  Please see their note for official documentation of the patient encounter.   Subjective:   Patient ID: [name] [name] is a 77 yo Female (1936-02-16, NWG#[id number]) who presents to clinic with chief complain of heart palpitations.   HPI: [name] [name] reports that she has experienced heart palpitations for the past two weeks mostly at night. She reports that on one occasion she checked her blood pressure and reports a pulse of 83 but can't recall her BP. She also checked her blood glucose at that time and reports that it was 57 mg/dl. She has checked her blood glucose other times when she experiences heart palpitations and her BG has been 90-110 mg/dl.She also reports that when she experiences heart palpitations, she also experiences chest tightness, and pain/weaknesss along her left arm and hand. She reports that the symptoms are relived by sitting up from a laying down position.The palpitations occur mostly in the evenings roughly 1-2X/day. She endorses increased daytime tiredness. She has also lost roughly 10-15 lbs.   Past Medical History  Diagnosis Date   Asthma    GERD (gastroesophageal reflux disease)    Hypertension    CAD (coronary artery disease)    Obesity    Diabetes mellitus    Cervical spondylosis 03/26/2012    Current Outpatient Prescriptions  Medication Sig Dispense Refill   budesonide-formoterol (SYMBICORT) 160-4.5 MCG/ACT inhaler Inhale 2 puffs into the lungs 2 (two) times daily.  1 Inhaler  11   clonazePAM (KLONOPIN) 1 MG tablet Take 1 mg by mouth 2 (two) times daily as needed.       Docusate Calcium (STOOL SOFTENER PO) Take 1-2 at bedtime as needed       insulin lispro protamine-lispro (HUMALOG MIX 75/25) (75-25) 100  UNIT/ML SUSP injection Inject 15 Units into the skin daily with supper.  10 mL  0   metFORMIN (GLUCOPHAGE) 500 MG tablet Take 500 mg by mouth 2 (two) times daily with a meal.        omeprazole (PRILOSEC) 20 MG capsule Take 30- 60 min before your first and last meals of the day  60 capsule  11   Probiotic Product (PROBIOTIC DAILY PO) Take 1 tablet by mouth daily.       simvastatin (ZOCOR) 40 MG tablet Take 40 mg by mouth at bedtime.       valsartan-hydrochlorothiazide (DIOVAN-HCT) 160-12.5 MG per tablet Take 1 tablet by mouth daily.       Vitamin D, Ergocalciferol, (DRISDOL) 50000 UNITS CAPS 1 capsule every Tuesday and Friday       albuterol (PROAIR HFA) 108 (90 BASE) MCG/ACT inhaler Inhale 2 puffs into the lungs every 6 (six) hours as needed for wheezing. 2 puffs every 4 hours as needed only  if your can't catch your breath  1 Inhaler  2   CALCIUM-VITAMIN D PO Take 1 tablet by mouth 2 (two) times daily.       Cetirizine HCl 10 MG CAPS Take 1 capsule by mouth at bedtime as needed.       Dextromethorphan-Guaifenesin (MUCINEX DM) 30-600 MG TB12 Take 1-2 every 12 hours as needed       NASAL SPRAY SALINE NA Rinse as needed  Polyethylene Glycol 3350 (MIRALAX PO) Take 1 capsule twice daily as needed       predniSONE (DELTASONE) 20 MG tablet Take 20 mg by mouth daily.       traMADol (ULTRAM) 50 MG tablet 1 every 4 hours as needed for cough or pain       No current facility-administered medications for this visit.   Family History  Problem Relation Age of Onset   Cancer Mother     Question pancreatic   Coronary artery disease Father    Breast cancer Sister 44   Coronary artery disease Sister 63   Colon cancer Neg Hx    History   Social History   Marital Status: Married    Spouse Name: N/A    Number of Children: N/A   Years of Education: N/A   Social History Main Topics   Smoking status: Never Smoker    Smokeless tobacco: Never Used   Alcohol Use: No   Drug  Use: No   Sexually Active: None   Other Topics Concern   None   Social History Narrative   Patient is retired   2 children    Review of Systems:    Pertinent items are noted in HPI. Objective:  Physical Exam: Filed Vitals:   03/31/13 1537  BP: 140/74  Pulse: 88  Temp: 98.2 F (36.8 C)  TempSrc: Oral  Resp: 20  Height: 5\' 7"  (1.702 m)  Weight: 210 lb 4.8 oz (95.391 kg)  SpO2: 97%    Examination: Constitutional: Well developed female in no acute distress.  HEENT: PERRL, EOMI, External acoustic meatus occluded with cerumen. Mild right sided thyroid glandular hypertrophy that is asymptomatic.  Resp: CTAB, no wheezes appreciated.  CV: Regular rate and rhythm. No murmurs, rubs or gallops noted.  Abdomen: Soft, non-tender, non-distended.  Extremities: Mild dry skin noted on BUE. LLE presents with mild pitting edema +1.   Assessment & Plan:  1. Reduce Lispro insulin to 15 units before dinner.  2. Obtain CMP, TSH, Vitamin D levels, Hemoglobin A1c.  3. Consider continuous heart monitor for atrial fibrillation, hyperthyroidism, Hypercalcemia, or Excessive Vitamin D if symptoms do not improve.  4. Follow up with lab findings.  5. Patient will report to her endocrinologist next week.   Disposition: Contact patient regarding lab findings.

## 2013-03-31 NOTE — Progress Notes (Deleted)
° °  This is a Psychologist, occupational Note.  The care of the patient was discussed with Dr. Marland Kitchen and the assessment and plan was formulated with their assistance.  Please see their note for official documentation of the patient encounter.   Subjective:   Patient ID: @NAME @ @SEX @   DOB: @DOB @ @AGE @   MRN: @MRN @  HPI: @M @@NAME @ is a @AGE @     @PMH @ @CMED @ @FAMHX @ @SOCHX @ Review of Systems: {Review Of Systems:30496} Objective:  Physical Exam: @VITALSM @ {Exam, Complete:17964} Assessment & Plan:  1. ***

## 2013-04-01 ENCOUNTER — Telehealth: Payer: Self-pay | Admitting: Internal Medicine

## 2013-04-01 DIAGNOSIS — R002 Palpitations: Secondary | ICD-10-CM | POA: Insufficient documentation

## 2013-04-01 LAB — COMPLETE METABOLIC PANEL WITH GFR
ALT: 12 U/L (ref 0–35)
AST: 13 U/L (ref 0–37)
BUN: 14 mg/dL (ref 6–23)
CO2: 31 mEq/L (ref 19–32)
Calcium: 10.6 mg/dL — ABNORMAL HIGH (ref 8.4–10.5)
Chloride: 101 mEq/L (ref 96–112)
Creat: 0.88 mg/dL (ref 0.50–1.10)
GFR, Est African American: 75 mL/min
Potassium: 4 mEq/L (ref 3.5–5.3)
Sodium: 140 mEq/L (ref 135–145)
Total Protein: 7.3 g/dL (ref 6.0–8.3)

## 2013-04-01 LAB — VITAMIN D 25 HYDROXY (VIT D DEFICIENCY, FRACTURES): Vit D, 25-Hydroxy: 63 ng/mL (ref 30–89)

## 2013-04-01 LAB — POCT GLYCOSYLATED HEMOGLOBIN (HGB A1C): Hemoglobin A1C: 6.9

## 2013-04-01 NOTE — Assessment & Plan Note (Signed)
See palpations for plan.

## 2013-04-01 NOTE — Telephone Encounter (Signed)
Patient denies palpitations and hypoglycemia over night. She reports that she felt better this morning. Discussed lab values and plan for follow up with Dr. Meredith Pel regarding her hypercalcemia.

## 2013-04-01 NOTE — Assessment & Plan Note (Signed)
Irrigation today by her nurse.

## 2013-04-01 NOTE — Assessment & Plan Note (Signed)
The most likely dx is hypoglycemia from taking 70/30 insulin at nighttime. She has not increased her insulin or changed her eating habits but has had weight loss that might indicate a decreased need for insulin. Therefore, will decrease dose and ask that she take it before her biggest meal which is the evening meal. Also check A1C as if low, it can indicate too tight control.  Also, her calcium has been elevated in past so will repeat along with alb so that it can be adjusted. Hyper calcemia is on diff dx.   Elevated Vit D is also on diff dx. She states she has not taken her Vit D for one month but will check level to ensure it is not too high.  A Fib is possible even with a nl EKG and rhythm strop today. If the insulin adjustment doesn't improve her sxs, will sch holter monitor.   Will also check TSH as as has slight hypertrophy of her R thyroid lobe. Gland smooth so no U/S at this time.  Ischemia is highly unlikely. Other more likely dx and EKG nl. If sxs cont, and holter is nl, consider Cards referral.

## 2013-04-01 NOTE — Progress Notes (Signed)
  Subjective:    Patient ID: [name] [name], female    DOB: 1935-10-01, 77 y.o.   MRN: [id number]  HPI  Ms Retz is a 77 yo who presents with about 2 weeks of intermittent episodes of her heart beating heard and fast. These occur about 1- 2 times daily, mostly at night. They come on unexpectedly and last about 45 min and resolve spontaneously. They have woken her up from sleep. In those instances, sitting up helps a bit & laying down increases the sxs again. Assoc sxs incl her L arm feeling hot, L arm weakness, and her left shoulder starts to hurt. She does described chest pressure and can't breathe well during an episode. She also feels lightheaded during hte episodes. She uses O2 at night (given by Dr Sherene Sires) but that doesn't help. Once her CBG was low and she once checked her HR via monitor and it was 84. She doesn't remember her BP.   She has noticed new dry skin, lack of taste, decreased energy, and not "feeling well" over the past 2 weeks. She has chronic DOE but denies exertional CP / angina.   She uses her 70/30 18-20 units after dinner in the evening before bed. She states she keeps crackers by her bed but doesn't eat after taking her insulin.   She has had unintentional about 13 lb weight loss in the past 2 yrs.   She complains of ear wax build up in her R ear.  Review of Systems     Objective:   Physical Exam        Assessment & Plan:

## 2013-04-07 NOTE — Addendum Note (Signed)
Addended by: Remus Blake on: 04/07/2013 02:31 PM   Modules accepted: Orders

## 2013-04-08 ENCOUNTER — Ambulatory Visit (INDEPENDENT_AMBULATORY_CARE_PROVIDER_SITE_OTHER): Payer: Medicare Other | Admitting: Internal Medicine

## 2013-04-08 ENCOUNTER — Encounter: Payer: Self-pay | Admitting: Internal Medicine

## 2013-04-08 VITALS — BP 137/76 | HR 80 | Temp 97.6°F | Wt 212.1 lb

## 2013-04-08 DIAGNOSIS — E119 Type 2 diabetes mellitus without complications: Secondary | ICD-10-CM

## 2013-04-08 DIAGNOSIS — I10 Essential (primary) hypertension: Secondary | ICD-10-CM

## 2013-04-08 DIAGNOSIS — E785 Hyperlipidemia, unspecified: Secondary | ICD-10-CM

## 2013-04-08 DIAGNOSIS — R002 Palpitations: Secondary | ICD-10-CM

## 2013-04-08 DIAGNOSIS — K219 Gastro-esophageal reflux disease without esophagitis: Secondary | ICD-10-CM

## 2013-04-08 DIAGNOSIS — Z1211 Encounter for screening for malignant neoplasm of colon: Secondary | ICD-10-CM

## 2013-04-08 DIAGNOSIS — Z0000 Encounter for general adult medical examination without abnormal findings: Secondary | ICD-10-CM

## 2013-04-08 LAB — BASIC METABOLIC PANEL WITH GFR
BUN: 12 mg/dL (ref 6–23)
Calcium: 10.3 mg/dL (ref 8.4–10.5)
GFR, Est African American: 79 mL/min
GFR, Est Non African American: 69 mL/min
Glucose, Bld: 151 mg/dL — ABNORMAL HIGH (ref 70–99)

## 2013-04-08 NOTE — Assessment & Plan Note (Signed)
Lipids:    Component Value Date/Time   CHOL 161 03/31/2013 1638   TRIG 74 03/31/2013 1638   HDL 80 03/31/2013 1638   LDLCALC 66 03/31/2013 1638   VLDL 15 03/31/2013 1638   CHOLHDL 2.0 03/31/2013 1638    Assessment: Patient's LDL is at goal on simvastatin 40 mg daily.  She has no apparent side effects of the medication.  Plan: Continue simvastatin at current dose.

## 2013-04-08 NOTE — Assessment & Plan Note (Signed)
Assessment: Patient is doing well with no symptoms on omeprazole 20 mg twice a day.  Plan: Continue omeprazole at current dose.

## 2013-04-08 NOTE — Assessment & Plan Note (Signed)
Assessment: Patient has had a mildly elevated calcium on more than one recent occasion.  She has also been on vitamin D supplementation 50,000 units 2 times a week.  Plan: The plan is to check a calcium and intact PTH level today.  I advised her to stop the vitamin D supplementation, since her recent vitamin D level was 63.

## 2013-04-08 NOTE — Progress Notes (Signed)
  Subjective:    Patient ID: [name] [name], female    DOB: 1936/01/23, 77 y.o.   MRN: [id number]  HPI Patient returns for followup of palpitations and occasional low blood sugars, for which she was seen in the outpatient clinic on 03/31/2013.  She presented then due to episodes of feeling that her heart was beating too rapidly which had occurred while lying in bed; on one occasion, she checked her blood glucose and it was low at 47.  She also reported a feeling of discomfort in her upper chest that she described as a tightness or heaviness.  She has had no exertional symptoms.  At the time of her visit on 8/5, she was instructed to decrease her Lantus insulin to a dose of 15 units daily and she did so.  Her symptoms have resolved, and she reports no more palpitations or episodes of chest discomfort; she also has had no further low blood sugars.  Today she is doing well without acute complaints.   Review of Systems  Constitutional: Negative for fever, chills and diaphoresis.  Respiratory: Positive for cough.   Cardiovascular: Positive for palpitations. Negative for chest pain.  Gastrointestinal: Negative for nausea, vomiting, abdominal pain, blood in stool and anal bleeding.  Genitourinary: Negative for dysuria and frequency.  Musculoskeletal: Negative for back pain.   Medication list was reviewed and updated.  Past medical history, past surgical history, family history, and social history were reviewed.      Objective:   Physical Exam  Constitutional: No distress.  Cardiovascular: Normal rate, regular rhythm and normal heart sounds.  Exam reveals no gallop and no friction rub.   No murmur heard. Pulmonary/Chest: Effort normal and breath sounds normal. No respiratory distress. She has no wheezes. She has no rales.  Abdominal: Soft. Bowel sounds are normal. She exhibits no distension. There is no tenderness. There is no rebound and no guarding.  Musculoskeletal: She exhibits no edema.        Assessment & Plan:

## 2013-04-08 NOTE — Assessment & Plan Note (Signed)
BP Readings from Last 3 Encounters:  04/08/13 137/76  03/31/13 140/74  01/11/13 120/78    Lab Results  Component Value Date   NA 140 04/08/2013   K 3.5 04/08/2013   CREATININE 0.84 04/08/2013    Assessment: Blood pressure control: controlled Progress toward BP goal:  at goal Comments: Blood pressure is at goal on valsartan-hydrochlorothiazide 160-12.5 mg daily  Plan: Medications:  continue current medications Educational resources provided: brochure Self management tools provided: home blood pressure logbook

## 2013-04-08 NOTE — Assessment & Plan Note (Signed)
Assessment: Patient's intermittent palpitations have resolved following the adjustment in her insulin dose last week.  She has had no further episodes of palpitations or chest discomfort and no low blood sugars.  Plan: I discussed with patient the option of a cardiology referral, but she would prefer no referral at this time unless her symptoms recur.  I think this is a reasonable plan.  I did advise her to call or return if she has recurrence of symptoms.

## 2013-04-08 NOTE — Patient Instructions (Signed)
General Instructions: 1.  Please see your eye doctor for annual eye exam. 2.  Please complete and return the Hemoccult cards as instructed. 3.  Stop the vitamin D 50,000 unit supplement. 4.  Please call or return if you have any further palpitations or if you have any chest pain or chest discomfort.   Treatment Goals:  Goals (1 Years of Data) as of 04/08/13         04/01/13 11/12/12 06/24/12 03/26/12     Result Component    . HEMOGLOBIN A1C < 7.0  6.9 6.9 6.8 6.7      Progress Toward Treatment Goals:  Treatment Goal 04/08/2013  Hemoglobin A1C at goal  Blood pressure at goal    Self Care Goals & Plans:  Self Care Goal 04/08/2013  Manage my medications take my medicines as prescribed; bring my medications to every visit; refill my medications on time  Monitor my health keep track of my blood glucose; bring my glucose meter and log to each visit; check my feet daily  Eat healthy foods eat baked foods instead of fried foods; eat foods that are low in salt  Be physically active find an activity I enjoy    Home Blood Glucose Monitoring 04/08/2013  Check my blood sugar 3 times a day  When to check my blood sugar before meals     Care Management & Community Referrals:  Referral 04/08/2013  Referrals made for care management support none needed  Referrals made to community resources none

## 2013-04-08 NOTE — Assessment & Plan Note (Signed)
I discussed the indications and advisability of colonoscopy, TDAP vaccine, and Zostavax with patient.  She declined all of these preventive measures.  She did agree to complete Hemoccult cards, which were provided today.

## 2013-04-08 NOTE — Assessment & Plan Note (Signed)
Lab Results  Component Value Date   HGBA1C 6.9 04/01/2013   HGBA1C 6.9 11/12/2012   HGBA1C 6.8 06/24/2012     Assessment: Diabetes control: good control (HgbA1C at goal) Progress toward A1C goal:  at goal Comments: Patient's NovoLog Mix 70/30 Flexpen dose was recently decreased to 15 units daily with supper; she is also on metformin 500 mg twice a day  Plan: Medications:  continue current medications Home glucose monitoring: Frequency: 3 times a day Timing: before meals Instruction/counseling given: reminded to get eye exam and reminded to bring medications to each visit Educational resources provided: brochure Self management tools provided: home glucose logbook Other plans: Ophthalmology referral

## 2013-04-09 LAB — URINALYSIS, MICROSCOPIC ONLY
Bacteria, UA: NONE SEEN
Casts: NONE SEEN

## 2013-04-09 LAB — URINALYSIS, ROUTINE W REFLEX MICROSCOPIC
Bilirubin Urine: NEGATIVE
Hgb urine dipstick: NEGATIVE
Ketones, ur: NEGATIVE mg/dL
Nitrite: NEGATIVE
Protein, ur: NEGATIVE mg/dL
Urobilinogen, UA: 0.2 mg/dL (ref 0.0–1.0)

## 2013-04-10 NOTE — Progress Notes (Signed)
Quick Note:  I notified patient of her lab results. Of note, the calcium and intact PTH levels were normal. ______

## 2013-04-29 ENCOUNTER — Ambulatory Visit: Payer: Medicare Other | Admitting: *Deleted

## 2013-04-30 ENCOUNTER — Other Ambulatory Visit: Payer: Self-pay | Admitting: Internal Medicine

## 2013-04-30 DIAGNOSIS — L309 Dermatitis, unspecified: Secondary | ICD-10-CM | POA: Insufficient documentation

## 2013-04-30 DIAGNOSIS — R21 Rash and other nonspecific skin eruption: Secondary | ICD-10-CM

## 2013-04-30 NOTE — Progress Notes (Signed)
Patient accompanied her husband to clinic yesterday and asked me to look at a pruritic rash on her neck and shoulders that has been present since her last clinic appointment and has not improved with a moisturizing lotion.  Exam shows patches of very small macules on her shoulders and neck, etiology unclear.  The plan is to refer to a dermatologist.

## 2013-05-26 NOTE — Addendum Note (Signed)
Addended by: Bufford Spikes on: 05/26/2013 02:07 PM   Modules accepted: Orders

## 2013-06-20 ENCOUNTER — Emergency Department (HOSPITAL_COMMUNITY)
Admission: EM | Admit: 2013-06-20 | Discharge: 2013-06-20 | Disposition: A | Discharge status: Home / Self Care | Payer: Medicare Other | Attending: Emergency Medicine | Admitting: Emergency Medicine

## 2013-06-20 ENCOUNTER — Encounter (HOSPITAL_COMMUNITY): Payer: Self-pay | Admitting: Emergency Medicine

## 2013-06-20 ENCOUNTER — Emergency Department (HOSPITAL_COMMUNITY): Payer: Medicare Other

## 2013-06-20 DIAGNOSIS — Z79899 Other long term (current) drug therapy: Secondary | ICD-10-CM | POA: Insufficient documentation

## 2013-06-20 DIAGNOSIS — E119 Type 2 diabetes mellitus without complications: Secondary | ICD-10-CM | POA: Insufficient documentation

## 2013-06-20 DIAGNOSIS — M546 Pain in thoracic spine: Secondary | ICD-10-CM | POA: Insufficient documentation

## 2013-06-20 DIAGNOSIS — E669 Obesity, unspecified: Secondary | ICD-10-CM | POA: Insufficient documentation

## 2013-06-20 DIAGNOSIS — I10 Essential (primary) hypertension: Secondary | ICD-10-CM | POA: Insufficient documentation

## 2013-06-20 DIAGNOSIS — M47812 Spondylosis without myelopathy or radiculopathy, cervical region: Secondary | ICD-10-CM | POA: Insufficient documentation

## 2013-06-20 DIAGNOSIS — M542 Cervicalgia: Secondary | ICD-10-CM | POA: Insufficient documentation

## 2013-06-20 DIAGNOSIS — M25519 Pain in unspecified shoulder: Secondary | ICD-10-CM | POA: Insufficient documentation

## 2013-06-20 DIAGNOSIS — K219 Gastro-esophageal reflux disease without esophagitis: Secondary | ICD-10-CM | POA: Insufficient documentation

## 2013-06-20 DIAGNOSIS — Z888 Allergy status to other drugs, medicaments and biological substances status: Secondary | ICD-10-CM | POA: Insufficient documentation

## 2013-06-20 DIAGNOSIS — Z882 Allergy status to sulfonamides status: Secondary | ICD-10-CM | POA: Insufficient documentation

## 2013-06-20 DIAGNOSIS — Z885 Allergy status to narcotic agent status: Secondary | ICD-10-CM | POA: Insufficient documentation

## 2013-06-20 DIAGNOSIS — I2510 Atherosclerotic heart disease of native coronary artery without angina pectoris: Secondary | ICD-10-CM | POA: Insufficient documentation

## 2013-06-20 DIAGNOSIS — M25511 Pain in right shoulder: Secondary | ICD-10-CM

## 2013-06-20 DIAGNOSIS — J45909 Unspecified asthma, uncomplicated: Secondary | ICD-10-CM | POA: Insufficient documentation

## 2013-06-20 DIAGNOSIS — R9431 Abnormal electrocardiogram [ECG] [EKG]: Secondary | ICD-10-CM | POA: Insufficient documentation

## 2013-06-20 LAB — POCT I-STAT TROPONIN I

## 2013-06-20 MED ORDER — NAPROXEN 250 MG PO TABS: 500.0000 mg | ORAL_TABLET | Freq: Three times a day (TID) | ORAL | Status: DC

## 2013-06-20 MED ORDER — HYDROMORPHONE HCL PF 1 MG/ML IJ SOLN
1.0000 mg | Freq: Once | INTRAMUSCULAR | Status: AC
  Administered 2013-06-20: 1 mg via INTRAMUSCULAR
  Filled 2013-06-20: qty 1

## 2013-06-20 MED ORDER — KETOROLAC TROMETHAMINE 30 MG/ML IJ SOLN
30.0000 mg | Freq: Once | INTRAMUSCULAR | Status: AC
  Administered 2013-06-20: 30 mg via INTRAMUSCULAR
  Filled 2013-06-20: qty 1

## 2013-06-20 MED ORDER — HYDROCODONE-ACETAMINOPHEN 5-325 MG PO TABS: 1.0000 | ORAL_TABLET | Freq: Three times a day (TID) | ORAL | Status: DC | PRN

## 2013-06-20 NOTE — ED Notes (Signed)
Patient transported to X-ray 

## 2013-06-20 NOTE — ED Provider Notes (Signed)
CSN: [id number]     Arrival date & time 06/20/13  0825 History   First MD Initiated Contact with Patient 06/20/13 0831     Chief Complaint  Patient presents with  . Arm Pain   (Consider location/radiation/quality/duration/timing/severity/associated sxs/prior Treatment) HPI Patient presents with pain in her right shoulder, right neck. Pain began approximately one week ago.  Since onset pain has been persistent, severe, throughout the mid upper arm and the right posterior back.  Pain is worse with motion.  Pain is minimally improved with naproxen. There is no distal dysesthesia or weakness, but the pain is increased with movement of the wrist and hand. There is no concurrent anterior chest pain, dyspnea, confusion, disorientation, fevers, chills, superficial skin changes.  Past Medical History  Diagnosis Date  . Asthma   . GERD (gastroesophageal reflux disease)   . Hypertension   . CAD (coronary artery disease)   . Obesity   . Diabetes mellitus   . Cervical spondylosis 03/26/2012  . Benign paroxysmal positional vertigo 11/05/2012   Past Surgical History  Procedure Laterality Date  . Cholecystectomy    . Appendectomy    . Abdominal hysterectomy    . Rotator cuff repair  2003    Right  . Lumbar laminectomy  2002   Family History  Problem Relation Age of Onset  . Cancer Mother     Question pancreatic  . Coronary artery disease Father   . Breast cancer Sister 64  . Coronary artery disease Sister 88  . Colon cancer Neg Hx    History  Substance Use Topics  . Smoking status: Never Smoker   . Smokeless tobacco: Never Used  . Alcohol Use: No   OB History   Grav Para Term Preterm Abortions TAB SAB Ect Mult Living                 Review of Systems  Constitutional:       Per HPI, otherwise negative  HENT:       Per HPI, otherwise negative  Respiratory:       Per HPI, otherwise negative  Cardiovascular:       Per HPI, otherwise negative  Gastrointestinal: Negative for  vomiting.  Endocrine:       Negative aside from HPI  Genitourinary:       Neg aside from HPI   Musculoskeletal:       Per HPI, otherwise negative  Skin: Negative for color change, pallor, rash and wound.  Neurological: Negative for syncope.    Allergies  Aspirin; Contrast media; Oxycodone-acetaminophen; and Sulfa antibiotics  Home Medications   Current Outpatient Rx  Name  Route  Sig  Dispense  Refill  . albuterol (PROAIR HFA) 108 (90 BASE) MCG/ACT inhaler   Inhalation   Inhale 2 puffs into the lungs every 6 (six) hours as needed for wheezing. 2 puffs every 4 hours as needed only  if your can't catch your breath   1 Inhaler   2   . budesonide-formoterol (SYMBICORT) 160-4.5 MCG/ACT inhaler   Inhalation   Inhale 2 puffs into the lungs 2 (two) times daily.   1 Inhaler   11   . Cetirizine HCl 10 MG CAPS   Oral   Take 1 capsule by mouth at bedtime as needed.         . clonazePAM (KLONOPIN) 1 MG tablet   Oral   Take 1 mg by mouth 2 (two) times daily as needed.         Marland Kitchen  Dextromethorphan-Guaifenesin (MUCINEX DM) 30-600 MG TB12      Take 1-2 every 12 hours as needed         . Docusate Calcium (STOOL SOFTENER PO)      Take 1-2 at bedtime as needed         . metFORMIN (GLUCOPHAGE) 500 MG tablet   Oral   Take 500 mg by mouth 2 (two) times daily with a meal.          . naproxen (NAPROSYN) 250 MG tablet   Oral   Take 500 mg by mouth every 8 (eight) hours as needed.         Marland Kitchen NASAL SPRAY SALINE NA      Rinse as needed         . NOVOLOG MIX 70/30 FLEXPEN (70-30) 100 UNIT/ML SUPN   Subcutaneous   Inject 15 Units into the skin daily with supper.         . Probiotic Product (PROBIOTIC DAILY PO)   Oral   Take 1 tablet by mouth daily.         . simvastatin (ZOCOR) 40 MG tablet   Oral   Take 40 mg by mouth at bedtime.         . valsartan-hydrochlorothiazide (DIOVAN-HCT) 160-12.5 MG per tablet   Oral   Take 1 tablet by mouth daily.         .  Vitamin D, Ergocalciferol, (DRISDOL) 50000 UNITS CAPS      1 capsule every Tuesday and Friday          BP 160/86  Pulse 77  Temp(Src) 97.7 F (36.5 C) (Oral)  Resp 15  SpO2 99% Physical Exam  Nursing note and vitals reviewed. Constitutional: She is oriented to person, place, and time. She appears well-developed and well-nourished. No distress.  HENT:  Head: Normocephalic and atraumatic.  Eyes: Conjunctivae and EOM are normal.  Neck: Neck supple.  No deformity, no midline tenderness to palpation.  Mild right paraspinal muscle tenderness to palpation.  Pain is worse in the right trapezius, right paraspinal area with right lateral rotation.  Cardiovascular: Normal rate and regular rhythm.   Pulmonary/Chest: Effort normal and breath sounds normal. No stridor. No respiratory distress.  Abdominal: She exhibits no distension.  Musculoskeletal: She exhibits no edema.       Right shoulder: She exhibits decreased range of motion, tenderness, bony tenderness and pain. She exhibits no swelling, no effusion, no crepitus, no deformity, no laceration, no spasm, normal pulse and normal strength.       Right elbow: Normal.      Right wrist: Normal.       Arms: Neurological: She is alert and oriented to person, place, and time. No cranial nerve deficit.  Skin: Skin is warm and dry.  Psychiatric: She has a normal mood and affect.    ED Course  Procedures (including critical care time) Labs Review Labs Reviewed  POCT I-STAT TROPONIN I   Imaging Review No results found.  EKG Interpretation     Ventricular Rate:  74 PR Interval:  183 QRS Duration: 81 QT Interval:  517 QTC Calculation: 574 R Axis:   -22 Text Interpretation:  Age not entered, assumed to be  77 years old for purpose of ECG interpretation Sinus rhythm Borderline left axis deviation Nonspecific T abnrm, anterolateral leads Prolonged QT interval  - essentially the same as prior Abnormal ekg           cardiac monitor  70  sinus rhythm normal Pulse oximetry 100% room air normal  10:48 AM Patient substantially improved.  Resting comfortably.  I discussed all results with her and her husband. MDM  No diagnosis found. Patient presents with new pain in the right shoulder, right arm, right back.  With her medical problems, screening evaluation for cardiac disease was conducted.  This was essentially unremarkable.  Patient's pain improved with medication here.  With no evidence of distress, significant stability, and with strong suspicion for a musculoskeletal etiology, the patient was discharged with analgesics, primary care, orthopedics, physical therapy followup.    Gerhard Munch, MD 06/20/13 1048

## 2013-06-20 NOTE — ED Notes (Signed)
Pt states on Monday she began having R arm pain and she points to her elbow.  Since that time she states the pain has moved up to her neck and radiates back down her arm.  Pt rates pain as 10/10 and describes as sharp/shooting. Pt states "I feel like there may be something in my arm making it jump".

## 2013-06-22 ENCOUNTER — Ambulatory Visit (INDEPENDENT_AMBULATORY_CARE_PROVIDER_SITE_OTHER): Payer: Medicare Other | Admitting: Internal Medicine

## 2013-06-22 ENCOUNTER — Encounter: Payer: Self-pay | Admitting: Internal Medicine

## 2013-06-22 VITALS — BP 135/75 | HR 71 | Temp 97.2°F | Ht 67.0 in | Wt 209.8 lb

## 2013-06-22 DIAGNOSIS — E119 Type 2 diabetes mellitus without complications: Secondary | ICD-10-CM

## 2013-06-22 DIAGNOSIS — M47812 Spondylosis without myelopathy or radiculopathy, cervical region: Secondary | ICD-10-CM

## 2013-06-22 MED ORDER — METHYLPREDNISOLONE (PAK) 4 MG PO TABS: ORAL_TABLET | ORAL | Status: DC

## 2013-06-22 MED ORDER — NAPROXEN 500 MG PO TABS: 500.0000 mg | ORAL_TABLET | Freq: Three times a day (TID) | ORAL | Status: AC

## 2013-06-22 NOTE — Assessment & Plan Note (Signed)
Assessment: Most like diagnosis compressive nerve pain in C8 level in view of the distribution of her pain and documented cervical spondylosis from C-spine CT scan in 12/2010. She is already on NSAIDs and Norco with little relief. I do not suspect internal shoulder pain from history and physical exam findings.   Plan: 1. Labs/imaging: none 2. Therapy: cont with Aleve 500 mg tid for 2 weeks. Short course of prednisone (Medrol DosePak). Referral to outpatient PT once pain is better.  3. Follow up: in 2 weeks.

## 2013-06-22 NOTE — Progress Notes (Signed)
Patient ID: [name] [name], female   DOB: 1936/06/14, 77 y.o.   MRN: [id number]   Subjective:   HPI: [name] [name] is a 77 y.o. AA woman with PMHX of cervical spondylosis, HTN, DM, obesity presents for an ED f/u visit.   She was seen in ED for severe right shoulder pain on 06/20/2013 which was thought to be MSK.  CXR in ED was normal. She was discharged on Aleve 500 mg tid and Norco 5-325 mg tid prn.   She describes pain that radiates from her right side of the neck all the way down to her upper extremities.  She describes the pain as sharp, shooting, constant, without relieving or exacerbating factors. She has tingling, and altered sensation in the fourth and fifth right fingers. She reports that she has continued to use Aleve and norco over the weekend. Her sleep has been limited due to pain. She further mentions that sensation is altered on the medial aspect of her rt forearm. She denies trauma. She denies numbness, or increased weakness of the right UE. She denies shortness of breath, chest pain, fevers, chills, increased fatigue, nausea, or vomiting. Patient reports that she has experienced similar symptoms before, and she has a history of lumbar degenerative disc disease with a history of surgery. She describes tingling and numbness in LE bilaterally.   Cervical CT in 12/2010: Multilevel cervical  spondylosis.  Past Medical History  Diagnosis Date  . Asthma   . GERD (gastroesophageal reflux disease)   . Hypertension   . CAD (coronary artery disease)   . Obesity   . Diabetes mellitus   . Cervical spondylosis 03/26/2012  . Benign paroxysmal positional vertigo 11/05/2012   Current Outpatient Prescriptions  Medication Sig Dispense Refill  . albuterol (PROAIR HFA) 108 (90 BASE) MCG/ACT inhaler Inhale 2 puffs into the lungs every 6 (six) hours as needed for wheezing. 2 puffs every 4 hours as needed only  if your can't catch your breath  1 Inhaler  2  . budesonide-formoterol  (SYMBICORT) 160-4.5 MCG/ACT inhaler Inhale 2 puffs into the lungs 2 (two) times daily.  1 Inhaler  11  . Dextromethorphan-Guaifenesin (MUCINEX DM) 30-600 MG TB12 Take 1-2 every 12 hours as needed      . Docusate Calcium (STOOL SOFTENER PO) Take 1-2 at bedtime as needed      . HYDROcodone-acetaminophen (NORCO/VICODIN) 5-325 MG per tablet Take 1 tablet by mouth every 8 (eight) hours as needed for pain.  15 tablet  0  . metFORMIN (GLUCOPHAGE) 500 MG tablet Take 500 mg by mouth 2 (two) times daily with a meal.       . naproxen (NAPROSYN) 500 MG tablet Take 1 tablet (500 mg total) by mouth 3 (three) times daily with meals.  42 tablet  0  . NASAL SPRAY SALINE NA Rinse as needed      . NOVOLOG MIX 70/30 FLEXPEN (70-30) 100 UNIT/ML SUPN Inject 15 Units into the skin daily with supper.      . Probiotic Product (PROBIOTIC DAILY PO) Take 1 tablet by mouth daily.      . simvastatin (ZOCOR) 40 MG tablet Take 40 mg by mouth at bedtime.      . valsartan-hydrochlorothiazide (DIOVAN-HCT) 160-12.5 MG per tablet Take 1 tablet by mouth daily.      . Vitamin D, Ergocalciferol, (DRISDOL) 50000 UNITS CAPS 1 capsule every Tuesday and Friday      . clonazePAM (KLONOPIN) 1 MG tablet Take 1 mg by mouth 2 (  two) times daily as needed.      . methylPREDNIsolone (MEDROL DOSPACK) 4 MG tablet follow package directions  21 tablet  0   No current facility-administered medications for this visit.   Family History  Problem Relation Age of Onset  . Cancer Mother     Question pancreatic  . Coronary artery disease Father   . Breast cancer Sister 67  . Coronary artery disease Sister 80  . Colon cancer Neg Hx    History   Social History  . Marital Status: Married    Spouse Name: N/A    Number of Children: N/A  . Years of Education: N/A   Social History Main Topics  . Smoking status: Never Smoker   . Smokeless tobacco: Never Used  . Alcohol Use: No  . Drug Use: No  . Sexual Activity: None   Other Topics Concern  .  None   Social History Narrative   Patient is retired   2 children   Review of Systems: Constitutional: Denies fever, chills, diaphoresis, appetite change and fatigue.  Respiratory: Denies SOB, DOE, cough, chest tightness, and wheezing. Denies chest pain. Cardiovascular: No chest pain, palpitations and leg swelling.  Gastrointestinal: No abdominal pain, nausea, vomiting, bloody stools Genitourinary: No dysuria, frequency, hematuria, or flank pain.  Psych: No depression symptoms. No SI or SA.   Objective:  Physical Exam: Filed Vitals:   06/22/13 1059  BP: 135/75  Pulse: 71  Temp: 97.2 F (36.2 C)  TempSrc: Oral  Height: 5\' 7"  (1.702 m)  Weight: 209 lb 12.8 oz (95.165 kg)  SpO2: 96%   General: Well nourished. In moderate discomfort distress. Sister present in exam room HEENT: Normal oral mucosa. MMM. Oropharynx open and clear. Lungs: CTA bilaterally. No chest wall tenderness.  Heart: RRR; no extra sounds or murmurs  Abdomen: Non-distended, normal BS, soft, nontender; no hepatosplenomegaly  Extremities: Rt UL: Tenderness on the cervical spine noted posteriorly. Right trapezius muscle TTP. No ROM of shoulder joint. No evidence of infection or inflammation. Reports altered sensation of the fourth and fifth fingers and also extending on the medial aspect of the forearm. Grip is strong, pulses are present and present. Skin looks normal. Reflexes are normal Lower extremities: No pedal edema. No joint swelling or tenderness. Neurologic: Normal EOM,  Alert and oriented x3. No obvious neurologic/cranial nerve deficits.  Assessment & Plan:  I have discussed my assessment and plan  with  my attending in the clinic, Dr. Josem Kaufmann  as detailed under problem based charting.

## 2013-06-22 NOTE — Patient Instructions (Addendum)
Please take prednisone as instructed on the medro pack  Please take Naprosyn for another 2-3 weeks  You can stop taking the Vicodin once you run out We will refer you to physical therapy once your pain is better  Please follow up in 2 weeks

## 2013-06-22 NOTE — Progress Notes (Signed)
Case discussed with Dr. Kazibwe at the time of the visit.  We reviewed the resident's history and exam and pertinent patient test results.  I agree with the assessment, diagnosis and plan of care documented in the resident's note. 

## 2013-07-08 ENCOUNTER — Ambulatory Visit: Payer: Medicare Other | Admitting: Internal Medicine

## 2013-07-14 NOTE — Addendum Note (Signed)
Addended by: Neomia Dear on: 07/14/2013 06:29 PM   Modules accepted: Orders

## 2013-07-30 ENCOUNTER — Ambulatory Visit (INDEPENDENT_AMBULATORY_CARE_PROVIDER_SITE_OTHER): Payer: Medicare Other | Admitting: Internal Medicine

## 2013-07-30 ENCOUNTER — Encounter (INDEPENDENT_AMBULATORY_CARE_PROVIDER_SITE_OTHER): Payer: Self-pay

## 2013-07-30 ENCOUNTER — Encounter: Payer: Self-pay | Admitting: Internal Medicine

## 2013-07-30 VITALS — BP 154/76 | HR 88 | Temp 97.4°F | Wt 207.1 lb

## 2013-07-30 DIAGNOSIS — L259 Unspecified contact dermatitis, unspecified cause: Secondary | ICD-10-CM

## 2013-07-30 DIAGNOSIS — L988 Other specified disorders of the skin and subcutaneous tissue: Secondary | ICD-10-CM

## 2013-07-30 DIAGNOSIS — R21 Rash and other nonspecific skin eruption: Secondary | ICD-10-CM

## 2013-07-30 DIAGNOSIS — L989 Disorder of the skin and subcutaneous tissue, unspecified: Secondary | ICD-10-CM

## 2013-07-30 NOTE — Progress Notes (Signed)
Patient ID: [name] [name], female   DOB: Feb 08, 1936, 77 y.o.   MRN: [id number]   Subjective:   Patient ID: [name] [name] female   DOB: June 22, 1936 77 y.o.   MRN: [id number]  HPI: Ms.[name] [name] is a 77 y.o.  very pleasant women with past medical history of hypertension, insulin-dependent Type II DM, HL, asthma, allergic rhinitis, cervical spondylosis, and GERD who presents with chief complaint of dark red spot on her right breast.   Patient reports that two days ago she was drying herself off from a shower and noticed a dark/red spot on the outer part of her right breast that was non-tender, non-pruritic, without warmth, swelling, increased breast size or heaviness. No reports of lumps, nipple discharge or retraction, orange peel skin, lymphedema, or tender lymph nodes. She denies any moles on her breast or recent trauma, injury, falls, insect bites, or application of new lotions/creams/soaps to her breast. She believes that since noticing the spot, the red portion of the spot has increased in size. She has not noticed any similar spots on the rest of her body or ever had this spot before.   She has no history of breast changes or abnormal mammograms. Her most recent mammogram on December 18, 2012 was normal with repeat screening recommended in 1 year. Her sister was diagnosed with breast cancer at (approx) age 38 who is still living and received lumpectomy and radiation. She has past history of hysterosalpingoopherectomy due to fibroids in the1980's (with cervix intact) without estrogen or progesterone therapy. She denies vaginal bleeding. She has no history of cancer. She reports a mole on her right leg may have possibly increased in size with crusting with no irregular borders, asymmetry, color changes, or bleeding. She was recently seen by dermatology on 06/09/13 for dermatitis that was present on her neck and shoulders that have since resolved with application of triamcinolone acetonide  0.1% cream.   She denies fever and weight loss but reports chills for the past 2-3 weeks and feeling fatigued for the past month.     Past Medical History  Diagnosis Date  . Asthma   . GERD (gastroesophageal reflux disease)   . Hypertension   . CAD (coronary artery disease)   . Obesity   . Diabetes mellitus   . Cervical spondylosis 03/26/2012  . Benign paroxysmal positional vertigo 11/05/2012   Current Outpatient Prescriptions  Medication Sig Dispense Refill  . albuterol (PROAIR HFA) 108 (90 BASE) MCG/ACT inhaler Inhale 2 puffs into the lungs every 6 (six) hours as needed for wheezing. 2 puffs every 4 hours as needed only  if your can't catch your breath  1 Inhaler  2  . budesonide-formoterol (SYMBICORT) 160-4.5 MCG/ACT inhaler Inhale 2 puffs into the lungs 2 (two) times daily.  1 Inhaler  11  . clonazePAM (KLONOPIN) 1 MG tablet Take 1 mg by mouth 2 (two) times daily as needed.      Marland Kitchen Dextromethorphan-Guaifenesin (MUCINEX DM) 30-600 MG TB12 Take 1-2 every 12 hours as needed      . Docusate Calcium (STOOL SOFTENER PO) Take 1-2 at bedtime as needed      . HYDROcodone-acetaminophen (NORCO/VICODIN) 5-325 MG per tablet Take 1 tablet by mouth every 8 (eight) hours as needed for pain.  15 tablet  0  . metFORMIN (GLUCOPHAGE) 500 MG tablet Take 500 mg by mouth 2 (two) times daily with a meal.       . methylPREDNIsolone (MEDROL DOSPACK) 4 MG tablet follow package directions  21 tablet  0  . NASAL SPRAY SALINE NA Rinse as needed      . NOVOLOG MIX 70/30 FLEXPEN (70-30) 100 UNIT/ML SUPN Inject 15 Units into the skin daily with supper.      . Probiotic Product (PROBIOTIC DAILY PO) Take 1 tablet by mouth daily.      . simvastatin (ZOCOR) 40 MG tablet Take 40 mg by mouth at bedtime.      . valsartan-hydrochlorothiazide (DIOVAN-HCT) 160-12.5 MG per tablet Take 1 tablet by mouth daily.      . Vitamin D, Ergocalciferol, (DRISDOL) 50000 UNITS CAPS 1 capsule every Tuesday and Friday       No current  facility-administered medications for this visit.   Family History  Problem Relation Age of Onset  . Cancer Mother     Question pancreatic  . Coronary artery disease Father   . Breast cancer Sister 109  . Coronary artery disease Sister 88  . Colon cancer Neg Hx    History   Social History  . Marital Status: Married    Spouse Name: N/A    Number of Children: N/A  . Years of Education: N/A   Social History Main Topics  . Smoking status: Never Smoker   . Smokeless tobacco: Never Used  . Alcohol Use: No  . Drug Use: No  . Sexual Activity: None   Other Topics Concern  . None   Social History Narrative   Patient is retired   2 children   Review of Systems: Review of Systems  Constitutional: Positive for chills (past 2-3 weeks) and malaise/fatigue (1 month). Negative for fever and weight loss.  Eyes: Negative for blurred vision.  Respiratory: Positive for cough (at baseline), shortness of breath (at baseline) and wheezing (at baseline).   Cardiovascular: Negative for chest pain, palpitations and leg swelling.  Gastrointestinal: Positive for constipation (at baseline). Negative for nausea, vomiting, abdominal pain, diarrhea and blood in stool.  Genitourinary: Negative for dysuria, urgency, frequency and hematuria.  Musculoskeletal: Positive for joint pain (at baseline). Negative for falls and myalgias.  Skin: Negative for itching and rash.  Neurological: Positive for headaches (2 weeks). Negative for dizziness.  Endo/Heme/Allergies: Does not bruise/bleed easily.    Objective:  Physical Exam: Filed Vitals:   07/30/13 1414  BP: 154/76  Pulse: 88  Temp: 97.4 F (36.3 C)  TempSrc: Oral  Weight: 207 lb 1.6 oz (93.94 kg)  SpO2: 98%    Physical Exam  Constitutional: She is oriented to person, place, and time. She appears well-developed and well-nourished. No distress.  HENT:  Head: Normocephalic and atraumatic.  Right Ear: External ear normal.  Left Ear: External ear  normal.  Nose: Nose normal.  Mouth/Throat: Oropharynx is clear and moist. No oropharyngeal exudate.  Eyes: EOM are normal. Right eye exhibits no discharge. Left eye exhibits no discharge. No scleral icterus.  Neck: Normal range of motion. Neck supple.  Cardiovascular: Normal rate and regular rhythm.   Pulmonary/Chest: Effort normal and breath sounds normal. No respiratory distress. She has no wheezes. She has no rales.  Abdominal: Soft. Bowel sounds are normal. She exhibits no distension. There is no tenderness. There is no rebound.  Genitourinary: No breast swelling, tenderness, discharge or bleeding.  1.5 cm well-circumscribed blue region with clear to red center on right upper lateral breast that is non-elevated, non-tender,non-blanchable without overlying warmth or swelling    Musculoskeletal: Normal range of motion. She exhibits no edema and no tenderness.  Neurological: She is alert and  oriented to person, place, and time.  Skin: Skin is warm and dry. No rash noted. She is not diaphoretic. No erythema. No pallor.  Not able to fully assess mole on right leg due to stockings but is dark and raised without asymmetric or irregular borders   Psychiatric: She has a normal mood and affect. Her behavior is normal. Judgment and thought content normal.    Assessment & Plan:   Please see problem list for problem based assessment and plan

## 2013-07-30 NOTE — Patient Instructions (Signed)
-  Please monitor the spot on your breast and take pictures if it changes -Dr. Meredith Pel will see you on 12/10 at 8:45 to reassess your rash -Nice meeting you!

## 2013-07-30 NOTE — Assessment & Plan Note (Addendum)
Assessment:  Pt with acute onset of approximately 1.5 cm well-circumscribed blue tinged region with clear to red center on right upper lateral breast that is non-elevated, non-tender, non-blanchable, without overlying warmth or swelling and no other breast changes with no recent trauma, insect bites, topical applications and normal mammogram in April 2014 without history of breast changes in the past. Skin abnormality appears to be area of ecchymosis that is progressing through normal stages of healing, however pt denies history of recent trauma to the breast. Pt also with recent  dermatitis of neck, face, shoulder, and arms that was treated with triamcinolone cream that has since resolved. Malignancy also on the differential, including breast and skin cancers considering FH of breast cancer in sister and reported change of mole on right leg.   Plan: -Pt instructed to monitor for changes and takes pictures of progression of skin abnormality on right breast  -Pt to wear loose clothing for assessment of mole on right leg on next visit -Pt to see PCP on 12/10 who will reassess to determine if dermatology referral needed

## 2013-07-30 NOTE — Assessment & Plan Note (Addendum)
Assessment: She was recently seen by dermatology on 06/09/13 and diagnosed with dermatitis present on her neck, face, shoulders, and arms that have since resolved with application of triamcinolone acetonide 0.1% cream ( to be applied BID PRN up to 6 weeks).    Plan: -Continue to monitor for reoccurrence

## 2013-07-31 NOTE — Progress Notes (Signed)
I saw and evaluated the patient.  I personally confirmed the key portions of the history and exam documented by Dr. Rabbani and I reviewed pertinent patient test results.  The assessment, diagnosis, and plan were formulated together and I agree with the documentation in the resident's note.  

## 2013-08-05 ENCOUNTER — Encounter (INDEPENDENT_AMBULATORY_CARE_PROVIDER_SITE_OTHER): Payer: Self-pay

## 2013-08-05 ENCOUNTER — Ambulatory Visit (INDEPENDENT_AMBULATORY_CARE_PROVIDER_SITE_OTHER): Payer: Medicare Other | Admitting: Internal Medicine

## 2013-08-05 ENCOUNTER — Encounter: Payer: Self-pay | Admitting: Internal Medicine

## 2013-08-05 VITALS — BP 140/70 | HR 80 | Temp 97.1°F | Ht 67.0 in | Wt 208.5 lb

## 2013-08-05 DIAGNOSIS — L989 Disorder of the skin and subcutaneous tissue, unspecified: Secondary | ICD-10-CM

## 2013-08-05 DIAGNOSIS — I10 Essential (primary) hypertension: Secondary | ICD-10-CM

## 2013-08-05 DIAGNOSIS — M47812 Spondylosis without myelopathy or radiculopathy, cervical region: Secondary | ICD-10-CM

## 2013-08-05 DIAGNOSIS — E119 Type 2 diabetes mellitus without complications: Secondary | ICD-10-CM

## 2013-08-05 DIAGNOSIS — E785 Hyperlipidemia, unspecified: Secondary | ICD-10-CM

## 2013-08-05 LAB — BASIC METABOLIC PANEL WITH GFR
BUN: 12 mg/dL (ref 6–23)
CO2: 30 mEq/L (ref 19–32)
Glucose, Bld: 87 mg/dL (ref 70–99)
Potassium: 3.7 mEq/L (ref 3.5–5.3)
Sodium: 139 mEq/L (ref 135–145)

## 2013-08-05 MED ORDER — NAPROXEN 500 MG PO TABS: 500.0000 mg | ORAL_TABLET | Freq: Two times a day (BID) | ORAL | Status: DC | PRN

## 2013-08-05 NOTE — Assessment & Plan Note (Signed)
Assessment: Patient reports marked improvement in her neck pain and right upper extremity pain for which she was seen here on 06/22/2013.  She has very mild decreased grip strength on the right, which she says is chronic.  She takes occasional naproxen 500 mg for pain when it does occur, but the pain is infrequent, not occurring even on a weekly basis.  Patient declined prior physical therapy referral.  Plan: Patient is significantly improved.  I discussed with her the option of physical therapy referral, but given her improvement, she declined.  I also discussed the option of neurosurgery referral but she does not wish to pursue that option given the improvement in her symptoms.  The plan is to continue the occasional use of naproxen 500 mg twice a day with food as needed for pain; however, I emphasized to patient the importance of not taking this regularly or frequently because of potential side effects.  She understands and agrees to limit the use of naproxen.

## 2013-08-05 NOTE — Assessment & Plan Note (Signed)
Lab Results  Component Value Date   HGBA1C 6.3 08/05/2013   HGBA1C 6.9 04/01/2013   HGBA1C 6.9 11/12/2012     Assessment: Diabetes control: good control (HgbA1C at goal) Progress toward A1C goal:  at goal Comments: Patient is doing well on metformin 500 mg twice a day and NovoLog mix 70/30 Flexpen 14 units daily with supper.  She is followed by Dr. Evlyn Kanner for management of her diabetes.  Plan: Medications:  continue current medications Home glucose monitoring: Frequency: 3 times a day Timing: before meals Instruction/counseling given: reminded to bring blood glucose meter & log to each visit and reminded to bring medications to each visit Other plans: Check a basic metabolic panel today.

## 2013-08-05 NOTE — Patient Instructions (Signed)
General Instructions: A referral has been made to your dermatologist for evaluation of the skin lesions on your left leg. Continue current medications.      Progress Toward Treatment Goals:  Treatment Goal 08/05/2013  Hemoglobin A1C at goal  Blood pressure at goal    Self Care Goals & Plans:  Self Care Goal 06/22/2013  Manage my medications take my medicines as prescribed; bring my medications to every visit; refill my medications on time  Monitor my health -  Eat healthy foods drink diet soda or water instead of juice or soda; eat more vegetables; eat foods that are low in salt; eat baked foods instead of fried foods  Be physically active -    Home Blood Glucose Monitoring 08/05/2013  Check my blood sugar 3 times a day  When to check my blood sugar before meals     Care Management & Community Referrals:  Referral 08/05/2013  Referrals made for care management support none needed  Referrals made to community resources none

## 2013-08-05 NOTE — Assessment & Plan Note (Signed)
Calcium  Date Value Range Status  04/08/2013 10.3  8.4 - 10.5 mg/dL Final  1/61/0960 9.8  8.4 - 10.5 mg/dL Final  11/29/4096 11.9* 8.4 - 10.5 mg/dL Final  1/47/8295 62.1* 8.4 - 10.5 mg/dL Final  30/86/5784 69.6  8.4 - 10.5 mg/dL Final     Assessment: Patient has had a mildly elevated calcium in the past, with her most recent level within normal range.  She was previously on vitamin D supplementation 50,000 units 2 times a week, and I advised her to stop the vitamin D in August because her vitamin D level was 63.  She reports that she resumed the vitamin D about one week ago because of cramps which she thought might be an indication that she needs vitamin D.  Plan: The plan is to check a basic metabolic panel today.  I again advised her that she does not need her previous level of vitamin D supplementation.

## 2013-08-05 NOTE — Assessment & Plan Note (Signed)
BP Readings from Last 3 Encounters:  08/05/13 140/70  07/30/13 154/76  06/22/13 135/75    Lab Results  Component Value Date   NA 140 04/08/2013   K 3.5 04/08/2013   CREATININE 0.84 04/08/2013    Assessment: Blood pressure control: controlled Progress toward BP goal:  at goal Comments: Blood pressure is controlled on valsartan-hydrochlorothiazide 160-12.5 mg daily.  Plan: Medications:  continue current medications

## 2013-08-05 NOTE — Progress Notes (Signed)
   Subjective:    Patient ID: [name] [name], female    DOB: 06/13/1936, 77 y.o.   MRN: [id number]  HPI Patient returns for followup of a skin abnormality noted on her right breast for which she was seen in the clinic last week, and for follow up of diabetes mellitus, asthma, and her other chronic medical problems.  The right breast skin abnormality she describes as a spot of discoloration which is nontender and nonpalpable; she has no memory of any trauma to that area.  She reports that this is much improved, with fading of the purplish discoloration.  She also asked about 2 small skin lesions on her left leg which are chronic; one of them she thinks may be getting larger.  She was seen in October here with neck pain and right arm pain attributed to cervical spondylosis, for which she was taking Naprosyn on an as-needed basis; she reports that the neck pain and right arm pain are much better.  She has occasional brief episodes of neck pain, and occasional right arm discomfort with tingling in her fingers but overall this is significantly improved.  She feels that the grip in her right hand is weaker than the left chronically.  Review of Systems  Constitutional: Negative for fever and chills.  Respiratory: Positive for wheezing (Occasional (1-3 times per week); uses albuterol inhaler.).   Cardiovascular: Negative for chest pain and leg swelling.  Gastrointestinal: Negative for nausea, vomiting, abdominal pain, blood in stool and anal bleeding.  Genitourinary: Negative for dysuria and frequency.       Objective:   Physical Exam  Constitutional: She appears well-developed and well-nourished. No distress.  Cardiovascular: Normal rate, regular rhythm and normal heart sounds.  Exam reveals no gallop and no friction rub.   No murmur heard. Pulmonary/Chest: Effort normal and breath sounds normal. No respiratory distress. She has no wheezes. She has no rales.  Abdominal: Soft. Bowel sounds are  normal. She exhibits no distension. There is no tenderness. There is no rebound and no guarding.  Musculoskeletal: She exhibits no edema.  Neurological:  Upper extremity motor exam is notable for 5-/5 right grip strength, otherwise strength is intact.  Skin:             Assessment & Plan:

## 2013-08-05 NOTE — Assessment & Plan Note (Signed)
Lipids:    Component Value Date/Time   CHOL 161 03/31/2013 1638   TRIG 74 03/31/2013 1638   HDL 80 03/31/2013 1638   LDLCALC 66 03/31/2013 1638   VLDL 15 03/31/2013 1638   CHOLHDL 2.0 03/31/2013 1638    Assessment: Patient's LDL is at goal on simvastatin 40 mg daily.  She is doing well without any apparent side effects of the medication.  Plan: Continue simvastatin 40 mg daily.

## 2013-08-05 NOTE — Assessment & Plan Note (Signed)
Assessment: Patient presents with 2 skin abnormalities.   1. The small round area of discoloration on her right breast has the appearance of a resolving bruise, and is improving according to the patient.  I asked Dr. Johna Roles to look at this as well, since she saw patient last week, and she confirmed that the discoloration is much improved.  This area is likely a resolving bruise, and I advised patient to let us know if it does not resolve completely. 2. Patient has a small pigmented lesion on her left anterior leg suggestive of a seborrheic keratosis, and 2 small pigmented lesions with irregular borders on her left anterolateral leg which may represent nevi.  I advised that the lesions on her left leg need to be evaluated by dermatologist.  Plan: Refer to dermatology for evaluation of the pigmented lesions on her left leg.

## 2013-09-17 ENCOUNTER — Encounter: Payer: Self-pay | Admitting: Internal Medicine

## 2013-09-17 ENCOUNTER — Ambulatory Visit (INDEPENDENT_AMBULATORY_CARE_PROVIDER_SITE_OTHER): Payer: Medicare Other | Admitting: Internal Medicine

## 2013-09-17 VITALS — BP 170/92 | HR 83 | Temp 97.7°F | Wt 208.1 lb

## 2013-09-17 DIAGNOSIS — I10 Essential (primary) hypertension: Secondary | ICD-10-CM

## 2013-09-17 DIAGNOSIS — M47812 Spondylosis without myelopathy or radiculopathy, cervical region: Secondary | ICD-10-CM

## 2013-09-17 MED ORDER — POLYETHYLENE GLYCOL 3350 17 G PO PACK: 17.0000 g | PACK | Freq: Every day | ORAL | Status: DC

## 2013-09-17 MED ORDER — KETOROLAC TROMETHAMINE 30 MG/ML IM SOLN: 30.0000 mg | Freq: Once | INTRAMUSCULAR | Status: DC

## 2013-09-17 MED ORDER — KETOROLAC TROMETHAMINE 30 MG/ML IJ SOLN
30.0000 mg | Freq: Once | INTRAMUSCULAR | Status: AC
  Administered 2013-09-17: 30 mg via INTRAMUSCULAR

## 2013-09-17 MED ORDER — NAPROXEN 500 MG PO TABS: 500.0000 mg | ORAL_TABLET | Freq: Two times a day (BID) | ORAL | Status: DC | PRN

## 2013-09-17 MED ORDER — SENNOSIDES-DOCUSATE SODIUM 8.6-50 MG PO TABS: 1.0000 | ORAL_TABLET | Freq: Every day | ORAL | Status: DC

## 2013-09-17 MED ORDER — HYDROCODONE-ACETAMINOPHEN 5-325 MG PO TABS: 1.0000 | ORAL_TABLET | Freq: Four times a day (QID) | ORAL | Status: DC | PRN

## 2013-09-17 NOTE — Progress Notes (Signed)
Case discussed with Dr. Sadek soon after the resident saw the patient.  We reviewed the resident's history and exam and pertinent patient test results.  I agree with the assessment, diagnosis, and plan of care documented in the resident's note. 

## 2013-09-17 NOTE — Progress Notes (Signed)
   Subjective:    Patient ID: [name] [name], female    DOB: 07/11/1936, 78 y.o.   MRN: [id number]  Neck Pain  Associated symptoms include numbness (slight on right side of shoulder and arm) and weakness (right hand). Pertinent negatives include no chest pain, fever, headaches or photophobia.   [name] [name] is a 78 yo woman pmh as listed below presents for ongoing radiating shoulder pain.   The patient states that she is unable to sleep at night given the throbbing consistent pain of her right shoulder that radiates in a shooting fashion down to her right hand. This is similar to the ongoing pain that she's had since October of 2014. She now states that the naproxen is not helping alleviate her pain and she is taking it twice a day. She has not tried anything else over-the-counter to help manage her symptoms. Any movement seems to exacerbate the pain nothing seems to fully relieve the pain even at rest is not providing relief. The patient does not remember any trauma to the area. Pt has noticed some increasing decreased strength in her right hand grip.   Review of Systems  Constitutional: Negative for fever, chills and fatigue.  HENT: Negative for tinnitus.   Eyes: Negative for photophobia and visual disturbance.  Respiratory: Negative for shortness of breath.   Cardiovascular: Negative for chest pain, palpitations and leg swelling.  Gastrointestinal: Negative for nausea, vomiting and diarrhea.  Musculoskeletal: Positive for neck pain. Negative for back pain, joint swelling and neck stiffness.  Neurological: Positive for weakness (right hand) and numbness (slight on right side of shoulder and arm). Negative for dizziness, syncope and headaches.       Shooting nerve pain down right shoulder to right hand     Past Medical History  Diagnosis Date  . Asthma   . GERD (gastroesophageal reflux disease)   . Hypertension   . CAD (coronary artery disease)   . Obesity   . Diabetes mellitus   .  Cervical spondylosis 03/26/2012  . Benign paroxysmal positional vertigo 11/05/2012   Social, surgical, family history reviewed with patient and updated in appropriate chart locations.     Objective:   Physical Exam Filed Vitals:   09/17/13 1327  BP: 170/92  Pulse: 83  Temp: 97.7 F (36.5 C)   General: sitting in chair, uncomfortable, NAD HEENT: PERRL, EOMI, no scleral icterus Cardiac: RRR, no rubs, murmurs or gallops Pulm: clear to auscultation bilaterally, moving normal volumes of air Abd: soft, nontender, nondistended, BS present Ext: warm and well perfused, no pedal edema Msk: right hand grip 4/5 and left hand grip 5/5, decreased sensation on right arm vs left, ttp over cervical spine Neuro: alert and oriented X3, cranial nerves II-XII grossly intact    Assessment & Plan:  Please see problem oriented charting  Pt discussed with Dr. Rogelia BogaButcher

## 2013-09-17 NOTE — Assessment & Plan Note (Signed)
Pt has known disease per 2012 imaging. Was origonally managed with just naproxen but is now not responding to this treatment. Pt has refused PT in the past. Pt does have frank weakness in her right hand grip.  -neurosurgery referral -IM toradol in clinic -norco for pain management -physical therapy recommendation but patient refused

## 2013-09-17 NOTE — Patient Instructions (Signed)
Cervical Radiculopathy °Cervical radiculopathy means a nerve in the neck is pinched or bruised. This can cause pain or loss of feeling (numbness) that runs from your neck to your arm and fingers. °HOME CARE  °· Put ice on the injured or painful area. °· Put ice in a plastic bag. °· Place a towel between your skin and the bag. °· Leave the ice on for 15-20 minutes, 03-04 times a day, or as told by your doctor. °· If ice does not help, you can try using heat. Take a warm shower or bath, or use a hot water bottle as told by your doctor. °· You may try a gentle neck and shoulder massage. °· Use a flat pillow when you sleep. °· Only take medicines as told by your doctor. °· Keep all physical therapy visits as told by your doctor. °· If you are given a soft collar, wear it as told by your doctor. °GET HELP RIGHT AWAY IF:  °· Your pain gets worse and is not controlled with medicine. °· You lose feeling or feel weak in your hand, arm, face, or leg. °· You have a fever or stiff neck. °· You cannot control when you poop or pee (incontinence). °· You have trouble with walking, balance, or speaking. °MAKE SURE YOU:  °· Understand these instructions. °· Will watch your condition. °· Will get help right away if you are not doing well or get worse. °Document Released: 08/02/2011 Document Revised: 11/05/2011 Document Reviewed: 08/02/2011 °[employer]® Patient Information ©2014 [employer]. ° °

## 2013-09-17 NOTE — Assessment & Plan Note (Signed)
BP Readings from Last 3 Encounters:  09/17/13 170/92  08/05/13 140/70  07/30/13 154/76   Pt may have slightly elevated in setting of severe pain. Will continue current management and follow up at next visit.

## 2013-09-18 ENCOUNTER — Telehealth: Payer: Self-pay | Admitting: *Deleted

## 2013-09-18 NOTE — Telephone Encounter (Signed)
Pt called stating that she was given a syringe at appt and then picked up toradol injectable at pharmacy but she just cant give herself the shot. It was found that [facility] and [facility] will [facility] and syringe [facility] and credit her for it. i reassured her and ask her to not open it, she agreed and relieved

## 2013-09-21 ENCOUNTER — Encounter (HOSPITAL_COMMUNITY): Payer: Self-pay | Admitting: Emergency Medicine

## 2013-09-21 ENCOUNTER — Observation Stay (HOSPITAL_COMMUNITY)
Admission: EM | Admit: 2013-09-21 | Discharge: 2013-09-22 | Disposition: A | Discharge status: Home / Self Care | Payer: Medicare Other | Attending: Internal Medicine | Admitting: Internal Medicine

## 2013-09-21 ENCOUNTER — Emergency Department (HOSPITAL_COMMUNITY): Payer: Medicare Other

## 2013-09-21 ENCOUNTER — Telehealth: Payer: Self-pay | Admitting: *Deleted

## 2013-09-21 ENCOUNTER — Emergency Department (INDEPENDENT_AMBULATORY_CARE_PROVIDER_SITE_OTHER)
Admission: EM | Admit: 2013-09-21 | Discharge: 2013-09-21 | Disposition: A | Discharge status: Nursing Home (Includes ICF) | Payer: Medicare Other | Source: Home / Self Care | Attending: Family Medicine | Admitting: Family Medicine

## 2013-09-21 DIAGNOSIS — H8110 Benign paroxysmal vertigo, unspecified ear: Secondary | ICD-10-CM | POA: Insufficient documentation

## 2013-09-21 DIAGNOSIS — Z79899 Other long term (current) drug therapy: Secondary | ICD-10-CM | POA: Insufficient documentation

## 2013-09-21 DIAGNOSIS — R0789 Other chest pain: Principal | ICD-10-CM | POA: Insufficient documentation

## 2013-09-21 DIAGNOSIS — M47812 Spondylosis without myelopathy or radiculopathy, cervical region: Secondary | ICD-10-CM | POA: Diagnosis present

## 2013-09-21 DIAGNOSIS — E669 Obesity, unspecified: Secondary | ICD-10-CM | POA: Insufficient documentation

## 2013-09-21 DIAGNOSIS — R079 Chest pain, unspecified: Secondary | ICD-10-CM | POA: Diagnosis present

## 2013-09-21 DIAGNOSIS — I2510 Atherosclerotic heart disease of native coronary artery without angina pectoris: Secondary | ICD-10-CM | POA: Insufficient documentation

## 2013-09-21 DIAGNOSIS — J45909 Unspecified asthma, uncomplicated: Secondary | ICD-10-CM | POA: Diagnosis present

## 2013-09-21 DIAGNOSIS — K219 Gastro-esophageal reflux disease without esophagitis: Secondary | ICD-10-CM | POA: Diagnosis present

## 2013-09-21 DIAGNOSIS — R002 Palpitations: Secondary | ICD-10-CM | POA: Diagnosis present

## 2013-09-21 DIAGNOSIS — I10 Essential (primary) hypertension: Secondary | ICD-10-CM | POA: Diagnosis present

## 2013-09-21 DIAGNOSIS — E119 Type 2 diabetes mellitus without complications: Secondary | ICD-10-CM | POA: Diagnosis present

## 2013-09-21 DIAGNOSIS — J9610 Chronic respiratory failure, unspecified whether with hypoxia or hypercapnia: Secondary | ICD-10-CM | POA: Diagnosis present

## 2013-09-21 HISTORY — DX: Shortness of breath: R06.02

## 2013-09-21 HISTORY — DX: Heart failure, unspecified: I50.9

## 2013-09-21 HISTORY — DX: Anxiety disorder, unspecified: F41.9

## 2013-09-21 LAB — CBC
HEMATOCRIT: 37.8 % (ref 36.0–46.0)
Hemoglobin: 12.2 g/dL (ref 12.0–15.0)
MCH: 27.4 pg (ref 26.0–34.0)
MCHC: 32.3 g/dL (ref 30.0–36.0)
MCV: 84.9 fL (ref 78.0–100.0)
Platelets: 281 10*3/uL (ref 150–400)
RBC: 4.45 MIL/uL (ref 3.87–5.11)
RDW: 14.8 % (ref 11.5–15.5)
WBC: 7.8 10*3/uL (ref 4.0–10.5)

## 2013-09-21 LAB — BASIC METABOLIC PANEL
BUN: 14 mg/dL (ref 6–23)
CO2: 27 mEq/L (ref 19–32)
Calcium: 9.5 mg/dL (ref 8.4–10.5)
Chloride: 102 mEq/L (ref 96–112)
Creatinine, Ser: 0.86 mg/dL (ref 0.50–1.10)
GFR calc Af Amer: 75 mL/min — ABNORMAL LOW (ref >90)
GFR, EST NON AFRICAN AMERICAN: 64 mL/min — AB (ref >90)
GLUCOSE: 111 mg/dL — AB (ref 70–99)
Potassium: 3.6 mEq/L — ABNORMAL LOW (ref 3.7–5.3)
SODIUM: 140 meq/L (ref 137–147)

## 2013-09-21 LAB — POCT I-STAT TROPONIN I: Troponin i, poc: 0.01 ng/mL (ref 0.00–0.08)

## 2013-09-21 MED ORDER — SODIUM CHLORIDE 0.9 % IV SOLN: Freq: Once | INTRAVENOUS | Status: DC

## 2013-09-21 MED ORDER — POTASSIUM CHLORIDE CRYS ER 20 MEQ PO TBCR
20.0000 meq | EXTENDED_RELEASE_TABLET | Freq: Once | ORAL | Status: AC
  Administered 2013-09-21: 20 meq via ORAL
  Filled 2013-09-21: qty 1

## 2013-09-21 NOTE — ED Notes (Signed)
[facility] and sent down here for further eval. Denies any pain with the fluttering feeling. States it started yesterday while she was in bed and was intermittent but got progressively worse today. Came by [facility]. [facility]

## 2013-09-21 NOTE — ED Notes (Signed)
Pt c/o chest pain onset Sunday Sxs also include: SOB, right shoulder pain, weakness Hx of CHF, diaphoresis Denies: f/v/n/d BP today is 171/88 --- Dr. Artis FlockKindl has been made aware.  Alert w/no signs of acute distress.

## 2013-09-21 NOTE — H&P (Signed)
Date: 09/21/2013               Patient Name:  [name] [name] MRN: [id number]  DOB: 01/18/1936 Age / Sex: 78 y.o., female   PCP: Farley LyJerry Dale Joines, MD         Medical Service: Internal Medicine Teaching Service         Attending Physician: Dr. Joya Gaskinsonald W Wickline, MD    First Contact: Dr. Windell Hummingbirdachel Chikowski, MD Pager: [phone]  Second Contact: Dr. Darden PalmerSamaya Qureshi, MD Pager: [phone]       After Hours (After 5p/  First Contact Pager: [phone]  weekends / holidays): Second Contact Pager: [phone]   Chief Complaint: Fluttering  History of Present Illness: [name] Jubileelbertine W Donofrio is a 78 y.o. woman w/ a pmhx of asthma, HTN, DM (hgA1c 6.3 dec-12), and HLD (LDL 66 aug-14) who presents with a cc of fluttering. The patient has previous episodes of chest fluttering and corresponding chest discomfort for which Dr. Mendel RyderJoine's suggested a probable cardiology referral. The patient states that she has had increasing number of episodes of chest discomfort 2/2 fluttering in her chest over the last 24 hours. She denies increased SOB or cough. The patient has associated symptoms of shoulder pain and The patient reports that she continues to have right-sided shoulder pain that radiates down her right arm. [facility] was [facility] for this complaint. The team believed that this was likely due to known hx of cervical spondylosis (CT in 2012) and was given a IM toradol shot and norco (60 tablets). This shoulder pain has been present since Oct of 2014. Furthermore, he also has a history of multiple previous surgeries on that shoulder.  She does not report improvement with recent treatment.   Of note, patient was admitted in 2004 for pulm edema. Records indicate that an echo was ordered citing previous MI. However, MI is not listed as a problem and CE's were negative. Echo was negative for CHF or focal hypokinesis. Further chart review revealed a note referring to a cardiac cath performed in 1999 that was negative for  obstructive CAD. The patient confirms that she has never been told that she had an MI and that she was told everything was clear on the cath in 1999.   She denies fevers, chills, cough, N, V, diarrhea, constipation. Denies changes in bowel or bladder habits.   Meds: No current facility-administered medications for this encounter.   Current Outpatient Prescriptions  Medication Sig Dispense Refill  . albuterol (PROAIR HFA) 108 (90 BASE) MCG/ACT inhaler Inhale 2 puffs into the lungs every 6 (six) hours as needed for wheezing. 2 puffs every 4 hours as needed only  if your can't catch your breath  1 Inhaler  2  . budesonide-formoterol (SYMBICORT) 160-4.5 MCG/ACT inhaler Inhale 2 puffs into the lungs 2 (two) times daily.  1 Inhaler  11  . clonazePAM (KLONOPIN) 1 MG tablet Take 1 mg by mouth 2 (two) times daily as needed for anxiety.       Tery Sanfilippo. Docusate Calcium (STOOL SOFTENER PO) Take 1 tablet by mouth daily as needed. For soft stool      . HYDROcodone-acetaminophen (NORCO/VICODIN) 5-325 MG per tablet Take 1-2 tablets by mouth every 6 (six) hours as needed for moderate pain.  60 tablet  0  . metFORMIN (GLUCOPHAGE) 500 MG tablet Take 500 mg by mouth 2 (two) times daily with a meal.       . naproxen (NAPROSYN) 500 MG tablet  Take 1 tablet (500 mg total) by mouth 2 (two) times daily as needed (for pain). Take with food.  20 tablet  1  . NOVOLOG MIX 70/30 FLEXPEN (70-30) 100 UNIT/ML SUPN Inject 14 Units into the skin daily with supper.       Marland Kitchen omeprazole (PRILOSEC) 20 MG capsule Take 1 capsule by mouth 2 (two) times daily.      . Probiotic Product (PROBIOTIC DAILY PO) Take 1 tablet by mouth See admin instructions. Only take tree times a week      . senna-docusate (SENOKOT-S) 8.6-50 MG per tablet Take 1 tablet by mouth daily.  30 tablet  12  . simvastatin (ZOCOR) 40 MG tablet Take 40 mg by mouth at bedtime.      . triamcinolone cream (KENALOG) 0.1 % Apply 1 application topically daily as needed. For dry spots  on face      . valsartan-hydrochlorothiazide (DIOVAN-HCT) 160-12.5 MG per tablet Take 1 tablet by mouth daily.      . Vitamin D, Ergocalciferol, (DRISDOL) 50000 UNITS CAPS 1 capsule every Tuesday and Friday        Allergies: Allergies as of 09/21/2013 - Review Complete 09/21/2013  Allergen Reaction Noted  . Aspirin    . Contrast media [iodinated diagnostic agents]  03/26/2012  . Oxycodone-acetaminophen    . Sulfa antibiotics Swelling 09/08/2012   Past Medical History  Diagnosis Date  . Asthma   . GERD (gastroesophageal reflux disease)   . Hypertension   . CAD (coronary artery disease)   . Obesity   . Diabetes mellitus   . Cervical spondylosis 03/26/2012  . Benign paroxysmal positional vertigo 11/05/2012   Past Surgical History  Procedure Laterality Date  . Cholecystectomy    . Appendectomy    . Abdominal hysterectomy    . Rotator cuff repair  2003    Right  . Lumbar laminectomy  2002   Family History  Problem Relation Age of Onset  . Cancer Mother     Question pancreatic  . Coronary artery disease Father   . Breast cancer Sister 75  . Coronary artery disease Sister 43  . Colon cancer Neg Hx    History   Social History  . Marital Status: Married    Spouse Name: N/A    Number of Children: N/A  . Years of Education: N/A   Occupational History  . Not on file.   Social History Main Topics  . Smoking status: Never Smoker   . Smokeless tobacco: Never Used  . Alcohol Use: No  . Drug Use: No  . Sexual Activity: Not on file   Other Topics Concern  . Not on file   Social History Narrative   Patient is retired   2 children    Review of Systems: Pertinent items are noted in HPI.  Physical Exam: Blood pressure 171/93, pulse 79, temperature 97.8 F (36.6 C), temperature source Oral, resp. rate 19, height 5\' 7"  (1.702 m), weight 207 lb (93.895 kg), SpO2 100.00%. Physical Exam  Constitutional: She is oriented to person, place, and time. She appears  well-developed and well-nourished. No distress.  HENT:  Head: Normocephalic.  Mouth/Throat: Oropharynx is clear and moist. No oropharyngeal exudate.  Cardiovascular: Normal rate, regular rhythm and intact distal pulses.  Exam reveals no gallop and no friction rub.   Murmur heard. 1/6 systolic murmur in the RUSB  Pulmonary/Chest: Effort normal. No respiratory distress. She has wheezes. She has no rales.  Mild wheezing bil, left greater than  right.  Abdominal: Soft. Bowel sounds are normal. She exhibits no distension. There is no tenderness. There is no rebound and no guarding.  Neurological: She is alert and oriented to person, place, and time.  Skin: She is not diaphoretic.  Psychiatric: She has a normal mood and affect. Her behavior is normal.   Lab results: Basic Metabolic Panel:  Recent Labs  16/10/96 1817  NA 140  K 3.6*  CL 102  CO2 27  GLUCOSE 111*  BUN 14  CREATININE 0.86  CALCIUM 9.5   CBC:  Recent Labs  09/21/13 1817  WBC 7.8  HGB 12.2  HCT 37.8  MCV 84.9  PLT 281   Cardiac Enzymes:  Troponin= 0.01  No results found for this basename: CKTOTAL, CKMB, CKMBINDEX, TROPONINI,  in the last 72 hours BNP: No results found for this basename: PROBNP,  in the last 72 hours  Imaging results:  Dg Chest 2 View  09/21/2013   CLINICAL DATA:  Atrial fibrillation, right chest pain  EXAM: CHEST  2 VIEW  COMPARISON:  06/20/2013  FINDINGS: The heart size and mediastinal contours are within normal limits. Both lungs are clear. The visualized skeletal structures are unremarkable. Prior cholecystectomy evident.  IMPRESSION: No active cardiopulmonary disease.   Electronically Signed   By: Ruel Favors M.D.   On: 09/21/2013 19:08    Other results: EKG: unchanged from previous tracings, nonspecific ST and T waves changes.  Assessment & Plan by Problem: Principal Problem:   Chest pain Active Problems:   DIABETES MELLITUS, TYPE II   HYPERTENSION   ASTHMA   RESPIRATORY  FAILURE, CHRONIC   GERD   Cervical spondylosis   Palpitations  Chest Pain c/b fluttering The patients chest pain is atypical for ACS. However, she has a TIMI score of 2 due to risk factors and age. Thus, it remains a possibility that she may have ACS.  The patients fluttering is of unknown etiology at this time.  - Admit to tele - Trop x 3 - Repeat EKG in AM - Consider cardiology referral for chest fluttering - No ASA as patient is allergic to it.  - On Statin.   Shoulder Pain The patients shoulder pain is likely due to chronic MSK pain versus cervical radiculopathy.  - Continue home Norco prn and Naproxen BID prn - Will give senokot. - May need f/u with sports med as outpatient.   Chronic Resp Failure 2/2 Asthma Patient appears to be at baseline with no increase in SOB or cough. However, she does have some mild wheezes on physical exam.  - Continue home symbicort. - Duonebs with xopenex q 6 hr prn  Hypokalemia Patient has mild low K at 3.4. 20 mEq in ED. Will f.u morning labs and replete as indicated.   DM II The patients DM appears stable. Will hold home insulin and do SSI.  HTN Appears stable continue home valsartan-HCTZ  HLD Appears stable. Continue home statin.   GERD Appears stable. PPI   DVT: Lovenox Diet: Carb Mod  Dispo: Disposition is deferred at this time, awaiting improvement of current medical problems. Anticipated discharge in approximately 1-2 day(s).   The patient does have a current PCP Farley Ly, MD) and does need an [facility] hospital follow-up appointment after discharge.  The patient does not have transportation limitations that hinder transportation to clinic appointments.  Signed: Pleas Koch, MD 09/21/2013, 10:25 PM

## 2013-09-21 NOTE — ED Provider Notes (Signed)
CSN: [id number]     Arrival date & time 09/21/13  1759 History   First MD Initiated Contact with Patient 09/21/13 1829     Chief Complaint  Patient presents with  . chest flutter    Patient is a 78 y.o. female presenting with chest pain. The history is provided by the patient.  Chest Pain Pain location:  R chest Pain quality: sharp   Pain severity:  Moderate Timing:  Intermittent Progression:  Resolved Relieved by:  Nothing Worsened by:  Nothing tried Associated symptoms: palpitations   Associated symptoms: no fever, no syncope and not vomiting   pt presents from urgent care She has reported palpitations recently and today has noticed right sided CP today as well as SOB The CP lasts for several minutes then resolves No pleuritic CP is reported Her CP is resolved at this time   Past Medical History  Diagnosis Date  . Asthma   . GERD (gastroesophageal reflux disease)   . Hypertension   . CAD (coronary artery disease)   . Obesity   . Diabetes mellitus   . Cervical spondylosis 03/26/2012  . Benign paroxysmal positional vertigo 11/05/2012   Past Surgical History  Procedure Laterality Date  . Cholecystectomy    . Appendectomy    . Abdominal hysterectomy    . Rotator cuff repair  2003    Right  . Lumbar laminectomy  2002   Family History  Problem Relation Age of Onset  . Cancer Mother     Question pancreatic  . Coronary artery disease Father   . Breast cancer Sister 148  . Coronary artery disease Sister 1949  . Colon cancer Neg Hx    History  Substance Use Topics  . Smoking status: Never Smoker   . Smokeless tobacco: Never Used  . Alcohol Use: No   OB History   Grav Para Term Preterm Abortions TAB SAB Ect Mult Living                 Review of Systems  Constitutional: Negative for fever.  Cardiovascular: Positive for chest pain and palpitations. Negative for syncope.  Gastrointestinal: Negative for vomiting.  Neurological: Negative for syncope.  All other  systems reviewed and are negative.    Allergies  Aspirin; Contrast media; Oxycodone-acetaminophen; and Sulfa antibiotics  Home Medications   Current Outpatient Rx  Name  Route  Sig  Dispense  Refill  . albuterol (PROAIR HFA) 108 (90 BASE) MCG/ACT inhaler   Inhalation   Inhale 2 puffs into the lungs every 6 (six) hours as needed for wheezing. 2 puffs every 4 hours as needed only  if your can't catch your breath   1 Inhaler   2   . budesonide-formoterol (SYMBICORT) 160-4.5 MCG/ACT inhaler   Inhalation   Inhale 2 puffs into the lungs 2 (two) times daily.   1 Inhaler   11   . clonazePAM (KLONOPIN) 1 MG tablet   Oral   Take 1 mg by mouth 2 (two) times daily as needed for anxiety.          Tery Sanfilippo. Docusate Calcium (STOOL SOFTENER PO)   Oral   Take 1 tablet by mouth daily as needed. For soft stool         . HYDROcodone-acetaminophen (NORCO/VICODIN) 5-325 MG per tablet   Oral   Take 1-2 tablets by mouth every 6 (six) hours as needed for moderate pain.   60 tablet   0   . metFORMIN (GLUCOPHAGE) 500 MG tablet  Oral   Take 500 mg by mouth 2 (two) times daily with a meal.          . naproxen (NAPROSYN) 500 MG tablet   Oral   Take 1 tablet (500 mg total) by mouth 2 (two) times daily as needed (for pain). Take with food.   20 tablet   1   . NOVOLOG MIX 70/30 FLEXPEN (70-30) 100 UNIT/ML SUPN   Subcutaneous   Inject 14 Units into the skin daily with supper.          Marland Kitchen omeprazole (PRILOSEC) 20 MG capsule   Oral   Take 1 capsule by mouth 2 (two) times daily.         . Probiotic Product (PROBIOTIC DAILY PO)   Oral   Take 1 tablet by mouth See admin instructions. Only take tree times a week         . senna-docusate (SENOKOT-S) 8.6-50 MG per tablet   Oral   Take 1 tablet by mouth daily.   30 tablet   12   . simvastatin (ZOCOR) 40 MG tablet   Oral   Take 40 mg by mouth at bedtime.         . triamcinolone cream (KENALOG) 0.1 %   Topical   Apply 1 application  topically daily as needed. For dry spots on face         . valsartan-hydrochlorothiazide (DIOVAN-HCT) 160-12.5 MG per tablet   Oral   Take 1 tablet by mouth daily.         . Vitamin D, Ergocalciferol, (DRISDOL) 50000 UNITS CAPS      1 capsule every Tuesday and Friday          BP 159/91  Pulse 85  Temp(Src) 97.8 F (36.6 C) (Oral)  Resp 16  Ht 5\' 7"  (1.702 m)  Wt 207 lb (93.895 kg)  BMI 32.41 kg/m2  SpO2 100% Physical Exam CONSTITUTIONAL: Well developed/well nourished HEAD: Normocephalic/atraumatic EYES: EOMI/PERRL ENMT: Mucous membranes moist NECK: supple no meningeal signs SPINE:entire spine nontender CV: S1/S2 noted, no murmurs/rubs/gallops noted LUNGS: Lungs are clear to auscultation bilaterally, no apparent distress ABDOMEN: soft, nontender, no rebound or guarding GU:no cva tenderness NEURO: Pt is awake/alert, moves all extremitiesx4 EXTREMITIES: pulses normal, full ROM SKIN: warm, color normal PSYCH: no abnormalities of mood noted  ED Course  Procedures (including critical care time)  8:52 PM Pt here with CP and palpitations She is CP free at this time I have discussed with internal medicine who is her PCP and they will evaluate for admission Pt has allergy to ASA, this was deferred Labs Review Labs Reviewed  BASIC METABOLIC PANEL - Abnormal; Notable for the following:    Potassium 3.6 (*)    Glucose, Bld 111 (*)    GFR calc non Af Amer 64 (*)    GFR calc Af Amer 75 (*)    All other components within normal limits  CBC  POCT I-STAT TROPONIN I   Imaging Review Dg Chest 2 View  09/21/2013   CLINICAL DATA:  Atrial fibrillation, right chest pain  EXAM: CHEST  2 VIEW  COMPARISON:  06/20/2013  FINDINGS: The heart size and mediastinal contours are within normal limits. Both lungs are clear. The visualized skeletal structures are unremarkable. Prior cholecystectomy evident.  IMPRESSION: No active cardiopulmonary disease.   Electronically Signed   By: Ruel Favors M.D.   On: 09/21/2013 19:08    EKG Interpretation    Date/Time:  Monday September 21 2013 18:10:51 EST Ventricular Rate:  76 PR Interval:  205 QRS Duration: 83 QT Interval:  363 QTC Calculation: 408 R Axis:   -25 Text Interpretation:  Sinus rhythm Borderline left axis deviation Borderline T wave abnormalities Confirmed by Bebe Shaggy  MD, Torri Langston [phone]) on 09/21/2013 6:32:09 PM            MDM  No diagnosis found. Nursing notes including past medical history and social history reviewed and considered in documentation xrays reviewed and considered Labs/vital reviewed and considered Previous records reviewed and considered - urgent care notes reviewed     Joya Gaskins, MD 09/21/13 2053

## 2013-09-21 NOTE — ED Provider Notes (Signed)
CSN: [id number]     Arrival date & time 09/21/13  1651 History   First MD Initiated Contact with Patient 09/21/13 1701     Chief Complaint  Patient presents with  . Chest Pain   (Consider location/radiation/quality/duration/timing/severity/associated sxs/prior Treatment) Patient is a 78 y.o. female presenting with chest pain. The history is provided by the patient.  Chest Pain Pain location:  Substernal area Pain quality: throbbing   Pain radiates to:  R shoulder Pain radiates to the back: no   Pain severity:  Mild Onset quality:  Sudden Progression:  Unchanged Chronicity:  New Associated symptoms: palpitations, shortness of breath and weakness   Associated symptoms: no abdominal pain, no cough, no heartburn, no nausea and not vomiting     Past Medical History  Diagnosis Date  . Asthma   . GERD (gastroesophageal reflux disease)   . Hypertension   . CAD (coronary artery disease)   . Obesity   . Diabetes mellitus   . Cervical spondylosis 03/26/2012  . Benign paroxysmal positional vertigo 11/05/2012   Past Surgical History  Procedure Laterality Date  . Cholecystectomy    . Appendectomy    . Abdominal hysterectomy    . Rotator cuff repair  2003    Right  . Lumbar laminectomy  2002   Family History  Problem Relation Age of Onset  . Cancer Mother     Question pancreatic  . Coronary artery disease Father   . Breast cancer Sister 20  . Coronary artery disease Sister 36  . Colon cancer Neg Hx    History  Substance Use Topics  . Smoking status: Never Smoker   . Smokeless tobacco: Never Used  . Alcohol Use: No   OB History   Grav Para Term Preterm Abortions TAB SAB Ect Mult Living                 Review of Systems  Respiratory: Positive for shortness of breath. Negative for cough.   Cardiovascular: Positive for chest pain and palpitations. Negative for leg swelling.  Gastrointestinal: Negative for heartburn, nausea, vomiting and abdominal pain.  Neurological:  Positive for weakness.    Allergies  Aspirin; Contrast media; Oxycodone-acetaminophen; and Sulfa antibiotics  Home Medications   Current Outpatient Rx  Name  Route  Sig  Dispense  Refill  . metFORMIN (GLUCOPHAGE) 500 MG tablet   Oral   Take 500 mg by mouth 2 (two) times daily with a meal.          . valsartan-hydrochlorothiazide (DIOVAN-HCT) 160-12.5 MG per tablet   Oral   Take 1 tablet by mouth daily.         Marland Kitchen albuterol (PROAIR HFA) 108 (90 BASE) MCG/ACT inhaler   Inhalation   Inhale 2 puffs into the lungs every 6 (six) hours as needed for wheezing. 2 puffs every 4 hours as needed only  if your can't catch your breath   1 Inhaler   2   . budesonide-formoterol (SYMBICORT) 160-4.5 MCG/ACT inhaler   Inhalation   Inhale 2 puffs into the lungs 2 (two) times daily.   1 Inhaler   11   . clonazePAM (KLONOPIN) 1 MG tablet   Oral   Take 1 mg by mouth 2 (two) times daily as needed.         Marland Kitchen Dextromethorphan-Guaifenesin (MUCINEX DM) 30-600 MG TB12      Take 1-2 every 12 hours as needed         . Docusate Calcium (STOOL SOFTENER  PO)      Take 1-2 at bedtime as needed         . HYDROcodone-acetaminophen (NORCO/VICODIN) 5-325 MG per tablet   Oral   Take 1-2 tablets by mouth every 6 (six) hours as needed for moderate pain.   60 tablet   0   . ketorolac (TORADOL) 30 MG/ML injection   Intramuscular   Inject 1 mL (30 mg total) into the muscle once.   1 mL   0   . naproxen (NAPROSYN) 500 MG tablet   Oral   Take 1 tablet (500 mg total) by mouth 2 (two) times daily as needed (for pain). Take with food.   20 tablet   1   . NASAL SPRAY SALINE NA      Rinse as needed         . NOVOLOG MIX 70/30 FLEXPEN (70-30) 100 UNIT/ML SUPN   Subcutaneous   Inject 14 Units into the skin daily with supper.          Marland Kitchen. omeprazole (PRILOSEC) 20 MG capsule   Oral   Take 1 capsule by mouth 2 (two) times daily.         . polyethylene glycol (MIRALAX) packet   Oral    Take 17 g by mouth daily.   14 each   0   . Probiotic Product (PROBIOTIC DAILY PO)   Oral   Take 1 tablet by mouth daily.         Marland Kitchen. senna-docusate (SENOKOT-S) 8.6-50 MG per tablet   Oral   Take 1 tablet by mouth daily.   30 tablet   12   . simvastatin (ZOCOR) 40 MG tablet   Oral   Take 40 mg by mouth at bedtime.         . triamcinolone cream (KENALOG) 0.1 %   Topical   Apply 1 application topically 2 (two) times daily.          . Vitamin D, Ergocalciferol, (DRISDOL) 50000 UNITS CAPS      1 capsule every Tuesday and Friday          BP 171/88  Pulse 81  Temp(Src) 97.9 F (36.6 C) (Oral)  Resp 16  SpO2 96% Physical Exam  Nursing note and vitals reviewed. Constitutional: She is oriented to person, place, and time. She appears well-developed and well-nourished. No distress.  HENT:  Head: Normocephalic.  Mouth/Throat: Oropharynx is clear and moist.  Neck: Normal range of motion. Neck supple.  Cardiovascular: Normal rate, regular rhythm, normal heart sounds and intact distal pulses.   Pulmonary/Chest: Effort normal and breath sounds normal.  Lymphadenopathy:    She has no cervical adenopathy.  Neurological: She is alert and oriented to person, place, and time.  Skin: Skin is warm and dry.    ED Course  Procedures (including critical care time) Labs Review Labs Reviewed - No data to display Imaging Review No results found.  EKG Interpretation    Date/Time:    Ventricular Rate:    PR Interval:    QRS Duration:   QT Interval:    QTC Calculation:   R Axis:     Text Interpretation:              MDM  [id number].  Sent for cp and sob , h/o chf, gen weakness. Sx since yest, fluttery feeling in chest.    Linna HoffJames D Marivel Mcclarty, MD 09/21/13 [phone]

## 2013-09-21 NOTE — Telephone Encounter (Signed)
Pt called with c/o pain from neck to shoulder to arms.Seen in clinic on 1/22 for this and told she had pinched nerve. Pt refused PT Today she still has the same pain to neck, shoulder and arm.  This on and off, rates 3/10.  This is positional. She is also having fluttering in chest. Onset 2 days ago.  The feeling is to right side of chest, she feels uncomfortable when happens.  This happens a lot and makes her feel weak. Denies dizziness. No visual changes. No chest pain and no SOB. This is new for pt and she is concerned.  Please advise Pt # [phone]

## 2013-09-21 NOTE — ED Notes (Signed)
[facility] did not have a truck avail.  EMS has been dispatched.  

## 2013-09-21 NOTE — ED Notes (Signed)
Internal med at bedside.

## 2013-09-21 NOTE — Telephone Encounter (Signed)
She is at home and not able to get here bfore close. Has h/p palp on PL but would need EKG to F/O A Fib / flutter. Suggest UC / ED

## 2013-09-21 NOTE — Telephone Encounter (Signed)
Pt informed and voices understanding 

## 2013-09-22 ENCOUNTER — Other Ambulatory Visit: Payer: Self-pay | Admitting: Physician Assistant

## 2013-09-22 ENCOUNTER — Observation Stay (HOSPITAL_COMMUNITY): Payer: Medicare Other

## 2013-09-22 ENCOUNTER — Encounter (HOSPITAL_COMMUNITY): Payer: Self-pay | Admitting: *Deleted

## 2013-09-22 DIAGNOSIS — R079 Chest pain, unspecified: Secondary | ICD-10-CM

## 2013-09-22 DIAGNOSIS — M47812 Spondylosis without myelopathy or radiculopathy, cervical region: Secondary | ICD-10-CM

## 2013-09-22 DIAGNOSIS — R002 Palpitations: Secondary | ICD-10-CM

## 2013-09-22 DIAGNOSIS — J45909 Unspecified asthma, uncomplicated: Secondary | ICD-10-CM

## 2013-09-22 DIAGNOSIS — J9610 Chronic respiratory failure, unspecified whether with hypoxia or hypercapnia: Secondary | ICD-10-CM

## 2013-09-22 DIAGNOSIS — K219 Gastro-esophageal reflux disease without esophagitis: Secondary | ICD-10-CM

## 2013-09-22 DIAGNOSIS — I10 Essential (primary) hypertension: Secondary | ICD-10-CM

## 2013-09-22 LAB — TSH: TSH: 2.209 u[IU]/mL (ref 0.350–4.500)

## 2013-09-22 LAB — CBC
HEMATOCRIT: 37.1 % (ref 36.0–46.0)
Hemoglobin: 12.1 g/dL (ref 12.0–15.0)
MCH: 27.9 pg (ref 26.0–34.0)
MCHC: 32.6 g/dL (ref 30.0–36.0)
MCV: 85.5 fL (ref 78.0–100.0)
Platelets: 308 10*3/uL (ref 150–400)
RBC: 4.34 MIL/uL (ref 3.87–5.11)
RDW: 14.9 % (ref 11.5–15.5)
WBC: 8 10*3/uL (ref 4.0–10.5)

## 2013-09-22 LAB — GLUCOSE, CAPILLARY
GLUCOSE-CAPILLARY: 163 mg/dL — AB (ref 70–99)
Glucose-Capillary: 121 mg/dL — ABNORMAL HIGH (ref 70–99)
Glucose-Capillary: 175 mg/dL — ABNORMAL HIGH (ref 70–99)

## 2013-09-22 LAB — COMPREHENSIVE METABOLIC PANEL
ALBUMIN: 3 g/dL — AB (ref 3.5–5.2)
ALT: 9 U/L (ref 0–35)
AST: 12 U/L (ref 0–37)
Alkaline Phosphatase: 85 U/L (ref 39–117)
BUN: 12 mg/dL (ref 6–23)
CO2: 27 mEq/L (ref 19–32)
CREATININE: 0.82 mg/dL (ref 0.50–1.10)
Calcium: 9.3 mg/dL (ref 8.4–10.5)
Chloride: 105 mEq/L (ref 96–112)
GFR calc Af Amer: 79 mL/min — ABNORMAL LOW (ref >90)
GFR calc non Af Amer: 68 mL/min — ABNORMAL LOW (ref >90)
Glucose, Bld: 137 mg/dL — ABNORMAL HIGH (ref 70–99)
Potassium: 3.7 mEq/L (ref 3.7–5.3)
Sodium: 143 mEq/L (ref 137–147)
TOTAL PROTEIN: 6.7 g/dL (ref 6.0–8.3)
Total Bilirubin: 0.3 mg/dL (ref 0.3–1.2)

## 2013-09-22 LAB — TROPONIN I

## 2013-09-22 MED ORDER — SODIUM CHLORIDE 0.9 % IJ SOLN
3.0000 mL | Freq: Two times a day (BID) | INTRAMUSCULAR | Status: DC
  Administered 2013-09-22 (×2): 3 mL via INTRAVENOUS

## 2013-09-22 MED ORDER — BUDESONIDE-FORMOTEROL FUMARATE 160-4.5 MCG/ACT IN AERO
2.0000 | INHALATION_SPRAY | Freq: Two times a day (BID) | RESPIRATORY_TRACT | Status: DC
  Filled 2013-09-22: qty 6

## 2013-09-22 MED ORDER — INSULIN ASPART 100 UNIT/ML ~~LOC~~ SOLN
0.0000 [IU] | Freq: Three times a day (TID) | SUBCUTANEOUS | Status: DC
  Administered 2013-09-22 (×2): 3 [IU] via SUBCUTANEOUS

## 2013-09-22 MED ORDER — TECHNETIUM TC 99M SESTAMIBI GENERIC - CARDIOLITE
30.0000 | Freq: Once | INTRAVENOUS | Status: AC | PRN
  Administered 2013-09-22: 30 via INTRAVENOUS

## 2013-09-22 MED ORDER — SIMVASTATIN 40 MG PO TABS
40.0000 mg | ORAL_TABLET | Freq: Every day | ORAL | Status: DC
  Administered 2013-09-22: 40 mg via ORAL
  Filled 2013-09-22 (×2): qty 1

## 2013-09-22 MED ORDER — LEVALBUTEROL HCL 0.63 MG/3ML IN NEBU
0.6300 mg | INHALATION_SOLUTION | Freq: Four times a day (QID) | RESPIRATORY_TRACT | Status: DC | PRN
  Administered 2013-09-22 (×2): 0.63 mg via RESPIRATORY_TRACT
  Filled 2013-09-22 (×2): qty 3

## 2013-09-22 MED ORDER — NAPROXEN 500 MG PO TABS
500.0000 mg | ORAL_TABLET | Freq: Two times a day (BID) | ORAL | Status: DC | PRN
  Filled 2013-09-22: qty 1

## 2013-09-22 MED ORDER — TRIAMCINOLONE ACETONIDE 0.1 % EX CREA
1.0000 "application " | TOPICAL_CREAM | Freq: Every day | CUTANEOUS | Status: DC | PRN
  Filled 2013-09-22: qty 15

## 2013-09-22 MED ORDER — HYDROCODONE-ACETAMINOPHEN 5-325 MG PO TABS: 1.0000 | ORAL_TABLET | Freq: Four times a day (QID) | ORAL | Status: DC | PRN

## 2013-09-22 MED ORDER — VALSARTAN-HYDROCHLOROTHIAZIDE 160-12.5 MG PO TABS: 1.0000 | ORAL_TABLET | Freq: Every day | ORAL | Status: DC

## 2013-09-22 MED ORDER — ENOXAPARIN SODIUM 40 MG/0.4ML ~~LOC~~ SOLN
40.0000 mg | SUBCUTANEOUS | Status: DC
  Administered 2013-09-22: 40 mg via SUBCUTANEOUS
  Filled 2013-09-22: qty 0.4

## 2013-09-22 MED ORDER — SODIUM CHLORIDE 0.9 % IV SOLN: 250.0000 mL | INTRAVENOUS | Status: DC | PRN

## 2013-09-22 MED ORDER — HYDROCHLOROTHIAZIDE 12.5 MG PO CAPS
12.5000 mg | ORAL_CAPSULE | Freq: Every day | ORAL | Status: DC
  Administered 2013-09-22: 12.5 mg via ORAL
  Filled 2013-09-22: qty 1

## 2013-09-22 MED ORDER — PNEUMOCOCCAL VAC POLYVALENT 25 MCG/0.5ML IJ INJ: 0.5000 mL | INJECTION | INTRAMUSCULAR | Status: DC

## 2013-09-22 MED ORDER — SODIUM CHLORIDE 0.9 % IJ SOLN: 3.0000 mL | Freq: Two times a day (BID) | INTRAMUSCULAR | Status: DC

## 2013-09-22 MED ORDER — REGADENOSON 0.4 MG/5ML IV SOLN
INTRAVENOUS | Status: AC
  Administered 2013-09-22: 0.4 mg via INTRAVENOUS
  Filled 2013-09-22: qty 5

## 2013-09-22 MED ORDER — POTASSIUM CHLORIDE CRYS ER 20 MEQ PO TBCR
20.0000 meq | EXTENDED_RELEASE_TABLET | Freq: Once | ORAL | Status: AC
  Administered 2013-09-22: 20 meq via ORAL
  Filled 2013-09-22: qty 1

## 2013-09-22 MED ORDER — ACETAMINOPHEN 325 MG PO TABS
650.0000 mg | ORAL_TABLET | Freq: Once | ORAL | Status: AC
  Administered 2013-09-22: 650 mg via ORAL

## 2013-09-22 MED ORDER — REGADENOSON 0.4 MG/5ML IV SOLN
0.4000 mg | Freq: Once | INTRAVENOUS | Status: AC
  Administered 2013-09-22: 0.4 mg via INTRAVENOUS

## 2013-09-22 MED ORDER — ACETAMINOPHEN 325 MG PO TABS
ORAL_TABLET | ORAL | Status: AC
  Administered 2013-09-22: 12:00:00
  Filled 2013-09-22: qty 2

## 2013-09-22 MED ORDER — PANTOPRAZOLE SODIUM 40 MG PO TBEC
40.0000 mg | DELAYED_RELEASE_TABLET | Freq: Every day | ORAL | Status: DC
  Administered 2013-09-22: 40 mg via ORAL
  Filled 2013-09-22: qty 1

## 2013-09-22 MED ORDER — SODIUM CHLORIDE 0.9 % IJ SOLN: 3.0000 mL | INTRAMUSCULAR | Status: DC | PRN

## 2013-09-22 MED ORDER — IRBESARTAN 150 MG PO TABS
150.0000 mg | ORAL_TABLET | Freq: Every day | ORAL | Status: DC
  Administered 2013-09-22: 150 mg via ORAL
  Filled 2013-09-22: qty 1

## 2013-09-22 MED ORDER — SENNOSIDES-DOCUSATE SODIUM 8.6-50 MG PO TABS
1.0000 | ORAL_TABLET | Freq: Every day | ORAL | Status: DC
  Administered 2013-09-22: 1 via ORAL
  Filled 2013-09-22: qty 1

## 2013-09-22 NOTE — Progress Notes (Signed)
Notified Dr. Darci Needlehikowski of stress test results

## 2013-09-22 NOTE — Progress Notes (Signed)
Pt stable and condition unchanged. D/c'd via wheelchair to private vehicle with family

## 2013-09-22 NOTE — Discharge Summary (Signed)
Name: [name] [name] MRN: [id number] DOB: 1936/06/24 78 y.o. PCP: Farley Ly, MD  Date of Admission: 09/21/2013  5:59 PM Date of Discharge: 09/22/2013 Attending Physician: Dr. Meredith Pel  Discharge Diagnosis: Principal Problem:   Chest pain- atypical Active Problems:   DIABETES MELLITUS, TYPE II   HYPERTENSION   ASTHMA   RESPIRATORY FAILURE, CHRONIC   GERD   Cervical spondylosis   Palpitations  Discharge Medications:   Medication List         albuterol 108 (90 BASE) MCG/ACT inhaler  Commonly known as:  PROAIR HFA  Inhale 2 puffs into the lungs every 6 (six) hours as needed for wheezing. 2 puffs every 4 hours as needed only  if your can't catch your breath     budesonide-formoterol 160-4.5 MCG/ACT inhaler  Commonly known as:  SYMBICORT  Inhale 2 puffs into the lungs 2 (two) times daily.     clonazePAM 1 MG tablet  Commonly known as:  KLONOPIN  Take 1 mg by mouth 2 (two) times daily as needed for anxiety.     HYDROcodone-acetaminophen 5-325 MG per tablet  Commonly known as:  NORCO/VICODIN  Take 1-2 tablets by mouth every 6 (six) hours as needed for moderate pain.     metFORMIN 500 MG tablet  Commonly known as:  GLUCOPHAGE  Take 500 mg by mouth 2 (two) times daily with a meal.     naproxen 500 MG tablet  Commonly known as:  NAPROSYN  Take 1 tablet (500 mg total) by mouth 2 (two) times daily as needed (for pain). Take with food.     NOVOLOG MIX 70/30 FLEXPEN (70-30) 100 UNIT/ML Pen  Generic drug:  Insulin Aspart Prot & Aspart  Inject 14 Units into the skin daily with supper.     omeprazole 20 MG capsule  Commonly known as:  PRILOSEC  Take 1 capsule by mouth 2 (two) times daily.     PROBIOTIC DAILY PO  Take 1 tablet by mouth See admin instructions. Only take tree times a week     senna-docusate 8.6-50 MG per tablet  Commonly known as:  Senokot-S  Take 1 tablet by mouth daily.     simvastatin 40 MG tablet  Commonly known as:  ZOCOR  Take 40 mg by  mouth at bedtime.     STOOL SOFTENER PO  Take 1 tablet by mouth daily as needed. For soft stool     triamcinolone cream 0.1 %  Commonly known as:  KENALOG  Apply 1 application topically daily as needed. For dry spots on face     valsartan-hydrochlorothiazide 160-12.5 MG per tablet  Commonly known as:  DIOVAN-HCT  Take 1 tablet by mouth daily.     Vitamin D (Ergocalciferol) 50000 UNITS Caps capsule  Commonly known as:  DRISDOL  1 capsule every Tuesday and Friday        Disposition and follow-up:   Ms.[name] [name] was discharged from [facility] in Stable condition.  At the hospital follow up visit please address:  1.  Patient was seen by cardiology and set up to have outpatient cardiac monitoring 2. Will likely need referral to sports medicine for her R shoulder pain  2.  Labs / imaging needed at time of follow-up: none  3.  Pending labs/ test needing follow-up: none  Follow-up Appointments: Follow-up Information   Follow up with [facility] [address] (The office will call you with a time to pick up your heart monitor.)  Specialty:  Cardiology   Contact information:   [address] [place] [address] [phone]      Follow up with Norma Fredrickson, NP. (10/13/13 at 3pm)    Specialty:  Nurse Practitioner   Contact information:   [address] [place] [address] [phone]       Follow up with Dierdre Searles, NA, MD On 10/06/2013. (10am)    Specialty:  Internal Medicine   Contact information:   [address] [place] [address] [phone]       Discharge Instructions: Discharge Orders   Future Appointments Provider Department Dept Phone   10/06/2013 10:00 AM Na Dierdre Searles, MD [facility] [phone]   10/07/2013 9:15 AM Farley Ly, MD [facility] [phone]   10/13/2013 3:00 PM Rosalio Macadamia, NP [facility] [phone]    Future Orders Complete By Expires   (HEART FAILURE PATIENTS) Call MD:  Anytime you have any of the following symptoms: 1) 3 pound weight gain in 24 hours or 5 pounds in 1 week 2) shortness of breath, with or without a dry hacking cough 3) swelling in the hands, feet or stomach 4) if you have to sleep on extra pillows at night in order to breathe.  As directed    Call MD for:  extreme fatigue  As directed    Call MD for:  persistant dizziness or light-headedness  As directed    Call MD for:  severe uncontrolled pain  As directed    Diet - low sodium heart healthy  As directed    Increase activity slowly  As directed       Consultations:  Cardiology  Procedures Performed:  Dg Chest 2 View  09/21/2013   CLINICAL DATA:  Atrial fibrillation, right chest pain  EXAM: CHEST  2 VIEW  COMPARISON:  06/20/2013  FINDINGS: The heart size and mediastinal contours are within normal limits. Both lungs are clear. The visualized skeletal structures are unremarkable. Prior cholecystectomy evident.  IMPRESSION: No active cardiopulmonary disease.   Electronically Signed   By: Ruel Favors M.D.   On: 09/21/2013 19:08   Lexiscan myoview 09/22/2013: IMPRESSION:  1. Negative for pharmacologic-stress induced ischemia.  2. Left ventricular ejection fraction 63%.  Admission HPI:  [name] [name] is a 78 y.o. woman w/ a pmhx of asthma, HTN, DM (hgA1c 6.3 dec-12), and HLD (LDL 66 aug-14) who presents with a cc of fluttering. The patient has previous episodes of chest fluttering and corresponding chest discomfort for which Dr. Mendel Ryder suggested a probable cardiology referral. The patient states that she has had increasing number of episodes of chest discomfort 2/2 fluttering in her chest over the last 24 hours. She denies increased SOB or cough. The patient has associated symptoms of shoulder pain and The patient reports that she continues to have right-sided shoulder pain that radiates down her right arm. She was seen in  [facility] 4 days ago for this complaint. The team believed that this was likely due to known hx of cervical spondylosis (CT in 2012) and was given a IM toradol shot and norco (60 tablets). This shoulder pain has been present since Oct of 2014. Furthermore, he also has a history of multiple previous surgeries on that shoulder. She does not report improvement with recent treatment.  Of note, patient was admitted in 2004 for pulm edema. Records indicate that an echo was ordered citing previous MI. However, MI  is not listed as a problem and CE's were negative. Echo was negative for CHF or focal hypokinesis. Further chart review revealed a note referring to a cardiac cath performed in 1999 that was negative for obstructive CAD. The patient confirms that she has never been told that she had an MI and that she was told everything was clear on the cath in 1999.   Hospital Course by problem list:  Chest Pain c/b fluttering: Patient's R sided, constant chest pain and tenderness to palpation over her R upper chest wall was very atypical for ACS. However, her intermittent "fluttering" (which was not associated with the chest pain) was somewhat concerning. ACS ruled out as troponin negative x3. CXR wnl. TSH wnl. Statin was continued. Patient is not on ASA given she is allergic. Cardiology was consulted and performed lexiscan myoview, which was negative. There were no events on telemetry overnight that would explain patients fluttering sensation in her chest. Cardiology set her up to have a cardiac monitor as an outpatient. I believe the R sided chest pain may be MSK in origin and/or related to her chronic shoulder pain. It is constant dull aching and it is reproducible with palpation.  Right Shoulder Pain: The patients shoulder pain is likely due to chronic MSK pain (perhaps rotator cuff tear vs OA) versus cervical radiculopathy (known hx of this). Shoulder is very tender to palpation. Given the pain is not new, this would be  best worked up as an outpatient. She may benefit from a sports medicine referral. We continued home Norco prn and Naproxen BID prn.  Asthma: Patient had some wheezing on initial exam. She received one xopenex nebulizer and the next morning her wheezing had resolved. No suspected exacerbation as there was no SOB or cough. We continued home symbicort.   Hypokalemia: Patient had mildly low K at 3.6 on admission, which responded to supplementation w/ KDur 20 mEq in ED. Repeat K next morning was 3.7. She received another Kdur prior to discharge.  DM II: The patients DM appears stable, last A1c 6.3% 07/2013. Held home regimen (Novolog mix 70/30 14U w/ supper and metformin 500mg  BID). Managed on SSI during her admission. Restarted home regimen at discharge.  HTN: BP stable. Continued home valsartan-HCTZ.  HLD: Stable, last lipid panel 03/2013 showed good control of lipids. Continued home statin.   Discharge Vitals:   BP 169/76  Pulse 86  Temp(Src) 98.3 F (36.8 C) (Oral)  Resp 16  Ht 5\' 7"  (1.702 m)  Wt 202 lb 1.6 oz (91.672 kg)  BMI 31.65 kg/m2  SpO2 98%  Discharge Labs:  Results for orders placed during the hospital encounter of 09/21/13 (from the past 24 hour(s))  TROPONIN I     Status: None   Collection Time    09/22/13  4:07 AM      Result Value Range   Troponin I <0.30  <0.30 ng/mL  COMPREHENSIVE METABOLIC PANEL     Status: Abnormal   Collection Time    09/22/13  4:07 AM      Result Value Range   Sodium 143  137 - 147 mEq/L   Potassium 3.7  3.7 - 5.3 mEq/L   Chloride 105  96 - 112 mEq/L   CO2 27  19 - 32 mEq/L   Glucose, Bld 137 (*) 70 - 99 mg/dL   BUN 12  6 - 23 mg/dL   Creatinine, Ser 6.07  0.50 - 1.10 mg/dL   Calcium 9.3  8.4 - 37.1  mg/dL   Total Protein 6.7  6.0 - 8.3 g/dL   Albumin 3.0 (*) 3.5 - 5.2 g/dL   AST 12  0 - 37 U/L   ALT 9  0 - 35 U/L   Alkaline Phosphatase 85  39 - 117 U/L   Total Bilirubin 0.3  0.3 - 1.2 mg/dL   GFR calc non Af Amer 68 (*) >90  mL/min   GFR calc Af Amer 79 (*) >90 mL/min  CBC     Status: None   Collection Time    09/22/13  4:07 AM      Result Value Range   WBC 8.0  4.0 - 10.5 K/uL   RBC 4.34  3.87 - 5.11 MIL/uL   Hemoglobin 12.1  12.0 - 15.0 g/dL   HCT 11.937.1  14.736.0 - 82.946.0 %   MCV 85.5  78.0 - 100.0 fL   MCH 27.9  26.0 - 34.0 pg   MCHC 32.6  30.0 - 36.0 g/dL   RDW 56.214.9  13.011.5 - 86.515.5 %   Platelets 308  150 - 400 K/uL  TSH     Status: None   Collection Time    09/22/13  4:07 AM      Result Value Range   TSH 2.209  0.350 - 4.500 uIU/mL  TROPONIN I     Status: None   Collection Time    09/22/13  7:25 AM      Result Value Range   Troponin I <0.30  <0.30 ng/mL  GLUCOSE, CAPILLARY     Status: Abnormal   Collection Time    09/22/13  7:25 AM      Result Value Range   Glucose-Capillary 121 (*) 70 - 99 mg/dL   Comment 1 Notify RN     Comment 2 Documented in Chart    GLUCOSE, CAPILLARY     Status: Abnormal   Collection Time    09/22/13  1:01 PM      Result Value Range   Glucose-Capillary 175 (*) 70 - 99 mg/dL   Comment 1 Notify RN     Comment 2 Documented in Chart    GLUCOSE, CAPILLARY     Status: Abnormal   Collection Time    09/22/13  4:43 PM      Result Value Range   Glucose-Capillary 163 (*) 70 - 99 mg/dL   Comment 1 Notify RN     Comment 2 Documented in Chart      Signed: Windell Hummingbirdachel Kendel Pesnell, MD 09/22/2013, 8:57 PM   Time Spent on Discharge: 35 minutes Services Ordered on Discharge: none Equipment Ordered on Discharge: none

## 2013-09-22 NOTE — Consult Note (Signed)
CONSULT NOTE  Date: 09/22/2013               Patient Name:  [name] [name] MRN: [id number]  DOB: 05/21/1936 Age / Sex: 78 y.o., female        PCP: Farley LyJOINES,JERRY DALE Primary Cardiologist: New / Sueo Cullen, previously seen by Aubery LappingEdmunds ( [place])             Referring Physician: Margarito LinerJerry Joines, MD ( Med Teaching Service)               Reason for Consult:  chest pain and palpitations            History of Present Illness: Patient is a 78 y.o. female with a PMHx of asthma, hypertension, and diabetes mellitus. She also has a history of hyperlipidemia. She was admitted to the hospital with complaints of fluttering in her chest. She also describes some chest discomfort.  She's previously been seen by Dr. Fraser DinPreston and Dr. Marlowe AschoffJohn Edmonds approximately 10 years ago. She's had a catheterization approximately 15 years ago which revealed nonobstructive disease.  She presents primarily with fluttering in her chest.  The fluttering is an irregularl palpitation that lasts for several minutes.  Onset is not related to anything specific.  Resolves spontaneously.  Not associated with dizziness, CP, dyspnea, no syncope.   Just worrisome .  She also has some right sided chest and neck pain.  Also radiates to her shoulder blade and down her right arm.  Int. Med clinic diagnosed this as a pinched nerve pain.  Pain is not aggrivated by twisting or turning torso.   Is worsened by sitting or lying down.  Relieved by sitting up.  [facility] - silver sneakers.  She has had a cough for the past several weeks.   Medications: Outpatient medications: Prescriptions prior to admission  Medication Sig Dispense Refill  . albuterol (PROAIR HFA) 108 (90 BASE) MCG/ACT inhaler Inhale 2 puffs into the lungs every 6 (six) hours as needed for wheezing. 2 puffs every 4 hours as needed only  if your can't catch your breath  1 Inhaler  2  . budesonide-formoterol (SYMBICORT) 160-4.5 MCG/ACT inhaler Inhale  2 puffs into the lungs 2 (two) times daily.  1 Inhaler  11  . clonazePAM (KLONOPIN) 1 MG tablet Take 1 mg by mouth 2 (two) times daily as needed for anxiety.       Tery Sanfilippo. Docusate Calcium (STOOL SOFTENER PO) Take 1 tablet by mouth daily as needed. For soft stool      . HYDROcodone-acetaminophen (NORCO/VICODIN) 5-325 MG per tablet Take 1-2 tablets by mouth every 6 (six) hours as needed for moderate pain.  60 tablet  0  . metFORMIN (GLUCOPHAGE) 500 MG tablet Take 500 mg by mouth 2 (two) times daily with a meal.       . naproxen (NAPROSYN) 500 MG tablet Take 1 tablet (500 mg total) by mouth 2 (two) times daily as needed (for pain). Take with food.  20 tablet  1  . NOVOLOG MIX 70/30 FLEXPEN (70-30) 100 UNIT/ML SUPN Inject 14 Units into the skin daily with supper.       Marland Kitchen. omeprazole (PRILOSEC) 20 MG capsule Take 1 capsule by mouth 2 (two) times daily.      . Probiotic Product (PROBIOTIC DAILY PO) Take 1 tablet by mouth See admin instructions. Only take tree times a week      . senna-docusate (SENOKOT-S) 8.6-50 MG  per tablet Take 1 tablet by mouth daily.  30 tablet  12  . simvastatin (ZOCOR) 40 MG tablet Take 40 mg by mouth at bedtime.      . triamcinolone cream (KENALOG) 0.1 % Apply 1 application topically daily as needed. For dry spots on face      . valsartan-hydrochlorothiazide (DIOVAN-HCT) 160-12.5 MG per tablet Take 1 tablet by mouth daily.      . Vitamin D, Ergocalciferol, (DRISDOL) 50000 UNITS CAPS 1 capsule every Tuesday and Friday        Current medications: Current Facility-Administered Medications  Medication Dose Route Frequency Provider Last Rate Last Dose  . 0.9 %  sodium chloride infusion  250 mL Intravenous PRN Ky Barban, MD      . budesonide-formoterol (SYMBICORT) 160-4.5 MCG/ACT inhaler 2 puff  2 puff Inhalation BID Ky Barban, MD      . enoxaparin (LOVENOX) injection 40 mg  40 mg Subcutaneous Q24H Ky Barban, MD      . hydrochlorothiazide (MICROZIDE) capsule  12.5 mg  12.5 mg Oral Daily Farley Ly, MD      . HYDROcodone-acetaminophen (NORCO/VICODIN) 5-325 MG per tablet 1-2 tablet  1-2 tablet Oral Q6H PRN Ky Barban, MD      . insulin aspart (novoLOG) injection 0-15 Units  0-15 Units Subcutaneous TID WC Ky Barban, MD      . irbesartan (AVAPRO) tablet 150 mg  150 mg Oral Daily Farley Ly, MD      . levalbuterol [facility]) nebulizer solution 0.63 mg  0.63 mg Nebulization Q6H PRN Ky Barban, MD   0.63 mg at 09/22/13 0120  . naproxen (NAPROSYN) tablet 500 mg  500 mg Oral BID PRN Ky Barban, MD      . pantoprazole (PROTONIX) EC tablet 40 mg  40 mg Oral Daily Ky Barban, MD      . Melene Muller ON 09/23/2013] pneumococcal 23 valent vaccine (PNU-IMMUNE) injection 0.5 mL  0.5 mL Intramuscular Tomorrow-1000 Farley Ly, MD      . senna-docusate (Senokot-S) tablet 1 tablet  1 tablet Oral Daily Ky Barban, MD      . simvastatin (ZOCOR) tablet 40 mg  40 mg Oral QHS Ky Barban, MD   40 mg at 09/22/13 0126  . sodium chloride 0.9 % injection 3 mL  3 mL Intravenous Q12H Ky Barban, MD      . sodium chloride 0.9 % injection 3 mL  3 mL Intravenous Q12H Ky Barban, MD   3 mL at 09/22/13 0126  . sodium chloride 0.9 % injection 3 mL  3 mL Intravenous PRN Ky Barban, MD      . triamcinolone cream (KENALOG) 0.1 % 1 application  1 application Topical Daily PRN Ky Barban, MD         Allergies  Allergen Reactions  . Aspirin     REACTION: heart palpitations  . Contrast Media [Iodinated Diagnostic Agents]   . Oxycodone-Acetaminophen     REACTION: rash  . Sulfa Antibiotics Swelling     Past Medical History  Diagnosis Date  . Asthma   . GERD (gastroesophageal reflux disease)   . Hypertension   . CAD (coronary artery disease)   . Obesity   . Diabetes mellitus   . Cervical spondylosis 03/26/2012  . Benign paroxysmal positional vertigo 11/05/2012  . CHF  (congestive heart failure)   . Shortness of breath   . Anxiety  Past Surgical History  Procedure Laterality Date  . Cholecystectomy    . Appendectomy    . Abdominal hysterectomy    . Rotator cuff repair  2003    Right  . Lumbar laminectomy  2002  . Cesarean section    . Ear pins bilaterally      Family History  Problem Relation Age of Onset  . Cancer Mother     Question pancreatic  . Coronary artery disease Father   . Breast cancer Sister 38  . Coronary artery disease Sister 70  . Colon cancer Neg Hx     Social History:  reports that she has never smoked. She has never used smokeless tobacco. She reports that she does not drink alcohol or use illicit drugs.   Review of Systems: Constitutional:  denies fever, chills, diaphoresis, appetite change and fatigue.  HEENT: denies photophobia, eye pain, redness, hearing loss, ear pain, congestion, sore throat, rhinorrhea, sneezing, neck pain, neck stiffness and tinnitus.  Respiratory: admits to  cough,    Cardiovascular: admits to chest pain, right shoulder, intrascapular pain  Gastrointestinal: denies nausea, vomiting, abdominal pain, diarrhea, constipation, blood in stool.  Genitourinary: denies dysuria, urgency, frequency, hematuria, flank pain and difficulty urinating.  Musculoskeletal: denies  myalgias, back pain, joint swelling, arthralgias and gait problem.   Skin: denies pallor, rash and wound.  Neurological: denies dizziness, seizures, syncope, weakness, light-headedness, numbness and headaches.   Hematological: denies adenopathy, easy bruising, personal or family bleeding history.  Psychiatric/ Behavioral: denies suicidal ideation, mood changes, confusion, nervousness, sleep disturbance and agitation.    Physical Exam: BP 165/74  Pulse 78  Temp(Src) 98.5 F (36.9 C) (Oral)  Resp 16  Ht 5\' 7"  (1.702 m)  Wt 202 lb 1.6 oz (91.672 kg)  BMI 31.65 kg/m2  SpO2 97%  General: Vital signs reviewed and noted.  Well-developed, well-nourished, in no acute distress; alert, appropriate and cooperative    Head: Normocephalic, atraumatic, sclera anicteric, mucus membranes are moist  Neck: Supple. Negative for carotid bruits. No JVD  Lungs:  Clear bilaterally to auscultation without wheezes, rales, or rhonchi. Breathing is normal  Heart: RRR with S1 S2. No murmurs, rubs, or gallops    Abdomen:  Soft, non-tender, non-distended with normoactive bowel sounds. No hepatomegaly. No rebound/guarding. No obvious abdominal masses  MSK: Strength and the appear normal for age.  Extremities: No clubbing or cyanosis. No edema.  Distal pedal pulses are 2+ and equal  Neurologic: Alert and oriented X 3. Moves all extremities spontaneously.  Psych: Responds to questions appropriately with a normal affect.    Lab results: Basic Metabolic Panel:  Recent Labs Lab 09/21/13 1817 09/22/13 0407  NA 140 143  K 3.6* 3.7  CL 102 105  CO2 27 27  GLUCOSE 111* 137*  BUN 14 12  CREATININE 0.86 0.82  CALCIUM 9.5 9.3    Liver Function Tests:  Recent Labs Lab 09/22/13 0407  AST 12  ALT 9  ALKPHOS 85  BILITOT 0.3  PROT 6.7  ALBUMIN 3.0*   No results found for this basename: LIPASE, AMYLASE,  in the last 168 hours No results found for this basename: AMMONIA,  in the last 168 hours  CBC:  Recent Labs Lab 09/21/13 1817 09/22/13 0407  WBC 7.8 8.0  HGB 12.2 12.1  HCT 37.8 37.1  MCV 84.9 85.5  PLT 281 308    Cardiac Enzymes:  Recent Labs Lab 09/22/13 0407  TROPONINI <0.30    BNP: No components found with this  basename: POCBNP,   CBG:  Recent Labs Lab 09/22/13 0725  GLUCAP 121*    Coagulation Studies: No results found for this basename: LABPROT, INR,  in the last 72 hours   Other results:  EKG :  NSR at 76.  She has nonspecific ST abnormalities in the lateral leads.   Imaging: Dg Chest 2 View  09/21/2013   CLINICAL DATA:  Atrial fibrillation, right chest pain  EXAM: CHEST  2 VIEW   COMPARISON:  06/20/2013  FINDINGS: The heart size and mediastinal contours are within normal limits. Both lungs are clear. The visualized skeletal structures are unremarkable. Prior cholecystectomy evident.  IMPRESSION: No active cardiopulmonary disease.   Electronically Signed   By: Ruel Favors M.D.   On: 09/21/2013 19:08       Assessment & Plan:  1. Chest discomfort:  the patient is admitted with fairly atypical episodes of chest pain. She has right-sided shoulder and interscapular pain. She was originally diagnosed as having a pinched nerve which may be the correct diagnosis.  She does have risk factors for coronary artery disease including diabetes mellitus and hypertension and does have a mildly abnormal EKG.  I think that a Lexiscan Myoview study is indicated given her risk factors and the presence of chest pain and abnormal EKG. She goes to the [facility] right basis and works out with silver sneakers and has not had any episodes of chest pain.  We will have her continue her regular workouts at the [facility].  If the Myoview is negative, she can be safely discharged from the hospital today. Please arrange for her to have an event monitor at our office.  2. Palpitations:   the patient presents with some palpitations. Her monitor has not shown any significant irregularities.  Her description of the palpitations is fairly vague and it is difficult to say whether or not these are premature ventricular contractions.  It is possible that she's having paroxysmal atrial fibrillation but we've not seen any evidence of it during the hospitalization. We can place a 30 day event monitor on her as an outpatient.  Vesta Mixer, Montez Hageman., MD, [facility] 09/22/2013, 9:41 AM Office - [phone] Pager [phone]

## 2013-09-22 NOTE — Progress Notes (Signed)
Subjective: Patient reports having a few episodes of "fluttering" feeling in her middle chest overnight that were both brief (minutes). Patient denies that anything such as caffeine, exercise, exertion makes her heart palpitations worse. She has had constant R sided "dull" chest pain and this has continued since her admission. She also has chronic R shoulder and neck pain thought to be 2/2 spondylosis.    Objective: Vital signs in last 24 hours: Filed Vitals:   09/21/13 2300 09/22/13 0015 09/22/13 0120 09/22/13 0619  BP: 153/82 180/85  165/74  Pulse: 84 86  78  Temp:  98.6 F (37 C)  98.5 F (36.9 C)  TempSrc:  Oral  Oral  Resp: 14 16  16   Height:  5\' 7"  (1.702 m)    Weight:  201 lb 3.2 oz (91.264 kg)  202 lb 1.6 oz (91.672 kg)  SpO2: 100% 96% 98% 97%   Weight change:  No intake or output data in the 24 hours ending 09/22/13 0919  Physical Exam General: alert, cooperative, and in no apparent distress HEENT: pupils equal round and reactive to light, vision grossly intact, oropharynx clear and non-erythematous  Neck: supple Lungs: clear to ascultation bilaterally, no wheezes this morning; R chest wall TTP Heart: regular rate and rhythm, perhaps a faint systolic ejection murmur Abdomen: soft, non-tender, non-distended, normal bowel sounds Extremities: warm extremities bilaterally, no pedal edema Neurologic: alert & oriented X3, cranial nerves II-XII grossly intact, strength grossly intact, sensation intact to light touch  Lab Results: Basic Metabolic Panel:  Recent Labs Lab 09/21/13 1817 09/22/13 0407  NA 140 143  K 3.6* 3.7  CL 102 105  CO2 27 27  GLUCOSE 111* 137*  BUN 14 12  CREATININE 0.86 0.82  CALCIUM 9.5 9.3   Liver Function Tests:  Recent Labs Lab 09/22/13 0407  AST 12  ALT 9  ALKPHOS 85  BILITOT 0.3  PROT 6.7  ALBUMIN 3.0*   CBC:  Recent Labs Lab 09/21/13 1817 09/22/13 0407  WBC 7.8 8.0  HGB 12.2 12.1  HCT 37.8 37.1  MCV 84.9 85.5  PLT  281 308   Cardiac Enzymes:  Recent Labs Lab 09/22/13 0407 09/22/13 0725  TROPONINI <0.30 <0.30   CBG:  Recent Labs Lab 09/22/13 0725  GLUCAP 121*   Hemoglobin A1C: No results found for this basename: HGBA1C,  in the last 168 hours  Studies/Results: Dg Chest 2 View  09/21/2013   CLINICAL DATA:  Atrial fibrillation, right chest pain  EXAM: CHEST  2 VIEW  COMPARISON:  06/20/2013  FINDINGS: The heart size and mediastinal contours are within normal limits. Both lungs are clear. The visualized skeletal structures are unremarkable. Prior cholecystectomy evident.  IMPRESSION: No active cardiopulmonary disease.   Electronically Signed   By: Ruel Favorsrevor  Shick M.D.   On: 09/21/2013 19:08   Medications: I have reviewed the patient's current medications. Scheduled Meds: . budesonide-formoterol  2 puff Inhalation BID  . enoxaparin (LOVENOX) injection  40 mg Subcutaneous Q24H  . hydrochlorothiazide  12.5 mg Oral Daily  . insulin aspart  0-15 Units Subcutaneous TID WC  . irbesartan  150 mg Oral Daily  . pantoprazole  40 mg Oral Daily  . [START ON 09/23/2013] pneumococcal 23 valent vaccine  0.5 mL Intramuscular Tomorrow-1000  . senna-docusate  1 tablet Oral Daily  . simvastatin  40 mg Oral QHS  . sodium chloride  3 mL Intravenous Q12H  . sodium chloride  3 mL Intravenous Q12H   Continuous Infusions:  PRN  Meds:.sodium chloride, HYDROcodone-acetaminophen, levalbuterol, naproxen, sodium chloride, triamcinolone cream  Assessment/Plan: Chest Pain c/b fluttering: ACS ruled out as troponin negative x2. Cardiology was consulted today and would like to lexiscan myoview patient today. Patient did have some fluttering in her chest, though no events seen on telemetry that would explain these symptoms. I believe the R sided chest pain may be MSK in origin and/or related to her shoulder pain. It is constant dull aching and it is reproducible with palpation. -f/u TSH -continue telemetry -f/u lexiscan  myoview -cards consult, appreciate recs -continue statin, no ASA given allergy  Shoulder Pain: The patients shoulder pain is likely due to chronic MSK pain versus cervical radiculopathy. Given the pain is not new, this would be best worked up as an outpatient. She may benefit from a sports medicine referral.  - Continue home Norco prn and Naproxen BID prn   Chronic Resp Failure 2/2 Asthma: Patient appears to be at baseline with no increase in SOB or cough. No more wheezes on my exam this morning after she received one neb tx overnight. - Continue home symbicort.  - Duonebs with xopenex q 6 hr prn   Hypokalemia: Patient had mild low K at 3.6 on admission, which responded well to supplementation w/ KDur 20 mEq in ED. Repeat K this morning is 3.7. Will give another Kdur today.   DM II: The patients DM appears stable, last A1c 6.3% 07/2013. Holding home regimen (Novolog mix 70/30 14U w/ supper and metformin 500mg  BID). Manage on SSI for now.   HTN: BP stable. Continue home valsartan-HCTZ.  HLD: Appears stable, last lipid panel 03/2013 showed good control of lipids. Continue home statin.   DVT: Lovenox  Diet: Carb Mod  Dispo: Disposition is deferred at this time, awaiting improvement of current medical problems.  Anticipated discharge in approximately 1 day(s).   The patient does have a current PCP Farley Ly, MD) and does need an [facility] hospital follow-up appointment after discharge.  The patient does not have transportation limitations that hinder transportation to clinic appointments.  .Services Needed at time of discharge: Y = Yes, Blank = No PT:   OT:   RN:   Equipment:   Other:     LOS: 1 day   Windell Hummingbird, MD 09/22/2013, 9:19 AM

## 2013-09-22 NOTE — Addendum Note (Signed)
Addended by: Neomia DearPOWERS, Sakib Noguez E on: 09/22/2013 07:41 AM   Modules accepted: Orders

## 2013-09-22 NOTE — H&P (Signed)
Internal Medicine Attending Admission Note Date: 09/22/2013  Patient name: [name] [name] Medical record number: [id number] Date of birth: 1936-08-20 Age: 78 y.o. Gender: female  I saw and evaluated the patient. I reviewed the resident's note and I agree with the resident's findings and plan as documented in the resident's note, with the following additional comments.  Chief Complaint(s): Heart fluttering, chest discomfort  History - key components related to admission: Patient is a 78 year old woman with history of diabetes mellitus, hypertension, asthma, hyperlipidemia, and other problems as outlined in medical history, admitted with complaint of heart fluttering with some associated chest discomfort.  Patient reports frequent episodes of a sensation of heart fluttering one day prior to admission, lasting up to 20 minutes and occurring intermittently through the day.  These recurred on the day of admission so she presented to the emergency department.  Physical Exam - key components related to admission:  Filed Vitals:   09/22/13 1124 09/22/13 1126 09/22/13 1130 09/22/13 1300  BP: 173/77 177/73 175/75 169/76  Pulse: 99 92 82 86  Temp:    98.3 F (36.8 C)  TempSrc:    Oral  Resp:      Height:      Weight:      SpO2:    98%    General: Alert, no distress Lungs: Clear Heart: Regular; no extra sounds or murmurs Abdomen: Bowel sounds present, soft, nontender Extremities: No edema   Lab results:   Basic Metabolic Panel:  Recent Labs  40/98/11 1817 09/22/13 0407  NA 140 143  K 3.6* 3.7  CL 102 105  CO2 27 27  GLUCOSE 111* 137*  BUN 14 12  CREATININE 0.86 0.82  CALCIUM 9.5 9.3    Liver Function Tests:  Recent Labs  09/22/13 0407  AST 12  ALT 9  ALKPHOS 85  BILITOT 0.3  PROT 6.7  ALBUMIN 3.0*     CBC:  Recent Labs  09/21/13 1817 09/22/13 0407  WBC 7.8 8.0  HGB 12.2 12.1  HCT 37.8 37.1  MCV 84.9 85.5  PLT 281 308   Cardiac Enzymes:  Recent  Labs  09/22/13 0407 09/22/13 0725  TROPONINI <0.30 <0.30     CBG:  Recent Labs  09/22/13 0725 09/22/13 1301  GLUCAP 121* 175*     Thyroid Function Tests:  Recent Labs  09/22/13 0407  TSH 2.209     Urinalysis    Component Value Date/Time   COLORURINE YELLOW 04/08/2013 1037   APPEARANCEUR CLEAR 04/08/2013 1037   LABSPEC 1.018 04/08/2013 1037   PHURINE 5.0 04/08/2013 1037   GLUCOSEU NEG 04/08/2013 1037   HGBUR NEG 04/08/2013 1037   BILIRUBINUR NEG 04/08/2013 1037   KETONESUR NEG 04/08/2013 1037   PROTEINUR NEG 04/08/2013 1037   UROBILINOGEN 0.2 04/08/2013 1037   NITRITE NEG 04/08/2013 1037   LEUKOCYTESUR SMALL* 04/08/2013 1037     Imaging results:  Dg Chest 2 View  09/21/2013   CLINICAL DATA:  Atrial fibrillation, right chest pain  EXAM: CHEST  2 VIEW  COMPARISON:  06/20/2013  FINDINGS: The heart size and mediastinal contours are within normal limits. Both lungs are clear. The visualized skeletal structures are unremarkable. Prior cholecystectomy evident.  IMPRESSION: No active cardiopulmonary disease.   Electronically Signed   By: Ruel Favors M.D.   On: 09/21/2013 19:08   Nm Myocar Multi W/spect W/wall Motion / Ef  09/22/2013   CLINICAL DATA:  Chest pain  EXAM: NUCLEAR MEDICINE MYOCARDIAL PERFUSION IMAGING  NUCLEAR MEDICINE LEFT  VENTRICULAR WALL MOTION ANALYSIS  NUCLEAR MEDICINE LEFT VENTRICULAR EJECTION FRACTION CALCULATION  TECHNIQUE: Standard single day myocardial SPECT imaging was performed after resting intravenous injection of [id number] sestamibi. After intravenous infusion of Lexiscan (regadenoson) under supervision of cardiology staff, sestamibiwas injected intravenously and standard myocardial SPECT imaging was performed. Quantitative gated imaging was also performed to evaluate left ventricular wall motion and estimate left ventricular ejection fraction.  Radiopharmaceutical: 10+30 mCi [id number] sestamibiIV.  COMPARISON:  None  FINDINGS: The stress SPECT images demonstrate  physiologic distribution of radiopharmaceutical. Rest images demonstrate no perfusion defects. The gated stress SPECT images demonstrate normal left ventricular myocardial thickening. No focal wall motion abnormality is seen. Calculated left ventricular end-diastolic volume 74ml, end-systolic volume 27ml, ejection fraction of 63%.  IMPRESSION: 1. Negative for pharmacologic-stress induced ischemia. 2. Left ventricular ejection fraction 63%.   Electronically Signed   By: Oley Balmaniel  Hassell M.D.   On: 09/22/2013 14:25    Other results: EKG: Sinus rhythm; borderline left axis deviation; borderline T wave abnormalities  Assessment & Plan by Problem:  1.  Palpitations with chest discomfort.  No evidence of acute coronary syndrome by enzymes or EKG; Dr. Elease HashimotoNahser saw patient in consultation, and a Myoview study done today was negative for ischemia.  As per Dr. Elease HashimotoNahser, he will work up palpitations with a 30 day event monitor.  2.  Other problems and plans as per the resident physician's note.

## 2013-09-22 NOTE — Discharge Instructions (Signed)
Palpitations   A palpitation is the feeling that your heartbeat is irregular or is faster than normal. It may feel like your heart is fluttering or skipping a beat. Palpitations are usually not a serious problem. However, in some cases, you may need further medical evaluation.  CAUSES   Palpitations can be caused by:   Smoking.   Caffeine or other stimulants, such as diet pills or energy drinks.   Alcohol.   Stress and anxiety.   Strenuous physical activity.   Fatigue.   Certain medicines.   Heart disease, especially if you have a history of arrhythmias. This includes atrial fibrillation, atrial flutter, or supraventricular tachycardia.   An improperly working pacemaker or defibrillator.  DIAGNOSIS   To find the cause of your palpitations, your caregiver will take your history and perform a physical exam. Tests may also be done, including:   Electrocardiography (ECG). This test records the heart's electrical activity.   Cardiac monitoring. This allows your caregiver to monitor your heart rate and rhythm in real time.   Holter monitor. This is a portable device that records your heartbeat and can help diagnose heart arrhythmias. It allows your caregiver to track your heart activity for several days, if needed.   Stress tests by exercise or by giving medicine that makes the heart beat faster.  TREATMENT   Treatment of palpitations depends on the cause of your symptoms and can vary greatly. Most cases of palpitations do not require any treatment other than time, relaxation, and monitoring your symptoms. Other causes, such as atrial fibrillation, atrial flutter, or supraventricular tachycardia, usually require further treatment.  HOME CARE INSTRUCTIONS    Avoid:   Caffeinated coffee, tea, soft drinks, diet pills, and energy drinks.   Chocolate.   Alcohol.   Stop smoking if you smoke.   Reduce your stress and anxiety. Things that can help you relax include:   A method that measures bodily functions so  you can learn to control them (biofeedback).   Yoga.   Meditation.   Physical activity such as swimming, jogging, or walking.   Get plenty of rest and sleep.  SEEK MEDICAL CARE IF:    You continue to have a fast or irregular heartbeat beyond 24 hours.   Your palpitations occur more often.  SEEK IMMEDIATE MEDICAL CARE IF:   You develop chest pain or shortness of breath.   You have a severe headache.   You feel dizzy, or you faint.  MAKE SURE YOU:   Understand these instructions.   Will watch your condition.   Will get help right away if you are not doing well or get worse.  Document Released: 08/10/2000 Document Revised: 12/08/2012 Document Reviewed: 10/12/2011  [employer] Patient Information 2014 [employer].

## 2013-09-22 NOTE — Progress Notes (Signed)
Lexiscan myoview completed without complication. No significant arrhythmia other than rare PAC during test. Await images. Ishan Sanroman PA-C

## 2013-09-22 NOTE — Progress Notes (Signed)
Lexiscan nuc was normal. Results conveyed to patient who was appreciative. Our office will call her with a time to come pick up her event monitor. She will f/u Norma FredricksonLori Gerhardt in clinic 10/13/13 at 3pm. OK to DC from cardiac standpoint. Lazette Estala PA-C

## 2013-10-06 ENCOUNTER — Ambulatory Visit: Payer: Medicare Other | Admitting: Internal Medicine

## 2013-10-06 ENCOUNTER — Encounter: Payer: Self-pay | Admitting: Internal Medicine

## 2013-10-07 ENCOUNTER — Ambulatory Visit (INDEPENDENT_AMBULATORY_CARE_PROVIDER_SITE_OTHER): Payer: Medicare Other | Admitting: Internal Medicine

## 2013-10-07 ENCOUNTER — Encounter: Payer: Self-pay | Admitting: Internal Medicine

## 2013-10-07 VITALS — BP 128/74 | HR 87 | Temp 97.1°F | Wt 201.0 lb

## 2013-10-07 DIAGNOSIS — E119 Type 2 diabetes mellitus without complications: Secondary | ICD-10-CM

## 2013-10-07 DIAGNOSIS — M47812 Spondylosis without myelopathy or radiculopathy, cervical region: Secondary | ICD-10-CM

## 2013-10-07 DIAGNOSIS — J45909 Unspecified asthma, uncomplicated: Secondary | ICD-10-CM

## 2013-10-07 DIAGNOSIS — I10 Essential (primary) hypertension: Secondary | ICD-10-CM

## 2013-10-07 DIAGNOSIS — R002 Palpitations: Secondary | ICD-10-CM

## 2013-10-07 NOTE — Assessment & Plan Note (Signed)
Assessment: Patient has right upper extremity pain and mildly decreased grip strength on the right.  She says that she cannot have an MRI done because she had metal  "pins" placed in her ears by Dr. Anner CreteWells several years ago, and was told that she should not have an MRI.  CT scan of her neck is an option.  She was referred to neurosurgery in January for this problem, but has not yet received an appointment.  Plan: Continue naproxen on an as-needed basis; I discussed side effects with patient and explained that she should limit the use of this medication as much as possible.  Rather than ordering a CT scan of the neck, the plan is to go ahead with neurosurgery referral and evaluation and let neurosurgery decide about the best approach to imaging her neck.

## 2013-10-07 NOTE — Assessment & Plan Note (Signed)
Lab Results  Component Value Date   HGBA1C 6.3 08/05/2013   HGBA1C 6.9 04/01/2013   HGBA1C 6.9 11/12/2012     Assessment: Diabetes control: good control (HgbA1C at goal) Progress toward A1C goal:  at goal Comments: Well controlled on metformin 500 mg twice a day and NovoLog Mix 70/30 insulin 14 units daily with supper.  Plan: Medications:  continue current medications; patient will followup with her endocrinologist Dr. Evlyn [facility]anner[facility] as scheduled Home glucose monitoring: Frequency: 3 times a day Timing: before meals Instruction/counseling given: reminded to bring medications to each visit

## 2013-10-07 NOTE — Assessment & Plan Note (Signed)
Assessment: Patient's intermittent palpitations have persisted following her recent hospitalization, during which she had a negative nuclear medicine stress study.  She has had no further episodes of chest discomfort.  She is scheduled to have an event monitor placed by cardiology tomorrow, and has a follow-up appointment with them next week.  Plan: Follow up with cardiology as scheduled.

## 2013-10-07 NOTE — Progress Notes (Signed)
   Subjective:    Patient ID: [name] [name], female    DOB: 12/06/1935, 78 y.o.   MRN: [id number]  HPI Patient returns for followup of a recent hospitalization in January with palpitations and chest discomfort; during the hospitalization, cardiology was consulted and a Myoview was done with a negative result.  She is scheduled tomorrow to have an event monitor placed by cardiology.  Since her discharge home, she reports still having episodes of "fluttering" in the right parasternal area, which last a few minutes, happen during day but more so at night, and occur several times every day.  She denies any chest pain.  She has some shortness of breath aggravated by exertion; she has been out of of her Symbicort inhaler.  She is still having pain in her right forearm with associated tingling, and subjective weakness of her right grip strength; she has a history of cervical spondylosis and a neurosurgery referral was made for this problem in January but she has not yet received an appointment.  She was prescribed hydrocodone with acetaminophen for her pain in January, but stopped taking this because it made her feel dizzy.  She has been taking naproxen 500 mg twice a day on an as-needed basis only, and reports reasonable relief of her pain with the naproxen.    Current medications, allergies, past medical history, family history, and social history were reviewed and updated.  Review of Systems  Constitutional: Negative for fever and chills.  Respiratory: Positive for shortness of breath (More with exertion; relieved by inhaler). Negative for cough and wheezing.   Cardiovascular: Positive for palpitations. Negative for chest pain and leg swelling.  Gastrointestinal: Negative for nausea, vomiting, abdominal pain, blood in stool and anal bleeding.  Genitourinary: Negative for dysuria and frequency.  Musculoskeletal: Positive for arthralgias (Occasional right knee pain, better with activity) and back pain.    Neurological: Positive for light-headedness (Occasional). Negative for dizziness and syncope.       Objective:   Physical Exam  Constitutional: She is oriented to person, place, and time. No distress.  Cardiovascular: Normal rate.  Exam reveals no gallop and no friction rub.   No murmur heard. Pulmonary/Chest: Effort normal and breath sounds normal. No respiratory distress. She has no wheezes. She has no rales.  Abdominal: Soft. Bowel sounds are normal. She exhibits no distension. There is no tenderness. There is no rebound and no guarding.  Musculoskeletal: She exhibits no edema.  Neurological: She is alert and oriented to person, place, and time.  Reflex Scores:      Tricep reflexes are 1+ on the right side and 1+ on the left side.      Bicep reflexes are 2+ on the right side and 2+ on the left side.      Brachioradialis reflexes are 2+ on the right side and 2+ on the left side. On motor testing, patient has 4+ over 5 strength of the right grip, right biceps, right triceps, and right deltoid.  Right upper extremity sensation is normal.  Straight leg raise is negative bilaterally.       Assessment & Plan:

## 2013-10-07 NOTE — Patient Instructions (Addendum)
General Instructions: Stop taking vitamin D supplementation. A neurosurgery referral has been made for evaluation of cervical spondylosis. Please keep your appointments with cardiology.   Progress Toward Treatment Goals:  Treatment Goal 10/07/2013  Hemoglobin A1C at goal  Blood pressure at goal    Self Care Goals & Plans:  Self Care Goal 10/07/2013  Manage my medications -  Monitor my health -  Eat healthy foods -  Be physically active -  Meeting treatment goals maintain the current self-care plan    Home Blood Glucose Monitoring 10/07/2013  Check my blood sugar 3 times a day  When to check my blood sugar before meals     Care Management & Community Referrals:  Referral 10/07/2013  Referrals made for care management support none needed  Referrals made to community resources none

## 2013-10-07 NOTE — Assessment & Plan Note (Signed)
BP Readings from Last 3 Encounters:  10/07/13 128/74  09/22/13 169/76  09/21/13 171/88    Lab Results  Component Value Date   NA 143 09/22/2013   K 3.7 09/22/2013   CREATININE 0.82 09/22/2013    Assessment: Blood pressure control: controlled Progress toward BP goal:  at goal Comments: Doing well on valsartan-hydrochlorothiazide (Diovan-HCT) 160-12.5 mg once daily  Plan: Medications:  continue current medications

## 2013-10-07 NOTE — Assessment & Plan Note (Signed)
Assessment: Patient reports some increase in her shortness of breath, but has been out of her Symbicort inhaler.  She has no respiratory difficulty today, and has a clear lung exam.  Plan: I advised patient to refill Symbicort and use it as prescribed; continue albuterol inhaler as before.

## 2013-10-08 ENCOUNTER — Encounter: Payer: Self-pay | Admitting: *Deleted

## 2013-10-08 ENCOUNTER — Encounter (INDEPENDENT_AMBULATORY_CARE_PROVIDER_SITE_OTHER): Payer: Medicare Other

## 2013-10-08 DIAGNOSIS — R002 Palpitations: Secondary | ICD-10-CM

## 2013-10-08 NOTE — Progress Notes (Signed)
Patient ID: [name] [name], female   DOB: 10/24/1935, 78 y.o.   MRN: [id number] [employer] [place] 30 day cardiac event monitor applied to patient.

## 2013-10-13 ENCOUNTER — Encounter: Payer: Medicare Other | Admitting: Nurse Practitioner

## 2013-11-10 ENCOUNTER — Other Ambulatory Visit: Payer: Self-pay | Admitting: Neurosurgery

## 2013-11-10 DIAGNOSIS — G959 Disease of spinal cord, unspecified: Secondary | ICD-10-CM

## 2013-11-11 ENCOUNTER — Telehealth: Payer: Self-pay | Admitting: *Deleted

## 2013-11-11 NOTE — Telephone Encounter (Signed)
MSG LEFT/ NORMAL ECARDIO RESULTS, ASKED TO CALL BACK WITH QUESTIONS OR CONCERNS.

## 2013-11-24 ENCOUNTER — Ambulatory Visit
Admission: RE | Admit: 2013-11-24 | Discharge: 2013-11-24 | Disposition: A | Discharge status: Home / Self Care | Payer: Medicare Other | Source: Ambulatory Visit | Attending: Neurosurgery | Admitting: Neurosurgery

## 2013-11-24 ENCOUNTER — Ambulatory Visit
Admission: RE | Admit: 2013-11-24 | Discharge: 2013-11-24 | Disposition: A | Discharge status: Home / Self Care | Payer: PRIVATE HEALTH INSURANCE | Source: Ambulatory Visit | Attending: Neurosurgery | Admitting: Neurosurgery

## 2013-11-24 VITALS — BP 159/81 | HR 93

## 2013-11-24 DIAGNOSIS — G959 Disease of spinal cord, unspecified: Secondary | ICD-10-CM

## 2013-11-24 DIAGNOSIS — M47812 Spondylosis without myelopathy or radiculopathy, cervical region: Secondary | ICD-10-CM

## 2013-11-24 MED ORDER — IOHEXOL 300 MG/ML  SOLN
10.0000 mL | Freq: Once | INTRAMUSCULAR | Status: AC | PRN
  Administered 2013-11-24: 10 mL via INTRATHECAL

## 2013-11-24 MED ORDER — ONDANSETRON HCL 4 MG/2ML IJ SOLN
4.0000 mg | Freq: Once | INTRAMUSCULAR | Status: AC
  Administered 2013-11-24: 4 mg via INTRAMUSCULAR

## 2013-11-24 MED ORDER — DIAZEPAM 5 MG PO TABS
5.0000 mg | ORAL_TABLET | Freq: Once | ORAL | Status: AC
  Administered 2013-11-24: 5 mg via ORAL

## 2013-11-24 MED ORDER — MEPERIDINE HCL 100 MG/ML IJ SOLN
75.0000 mg | Freq: Once | INTRAMUSCULAR | Status: AC
  Administered 2013-11-24: 75 mg via INTRAMUSCULAR

## 2013-11-24 NOTE — Progress Notes (Signed)
Patient states she took 13-hour prep as prescribed.  jkl

## 2013-11-24 NOTE — Discharge Instructions (Signed)

## 2013-11-27 NOTE — Progress Notes (Signed)
Patient called to report ongoing headache with nausea since she completed the strict 24 hours of bedrest after her myelogram here 11/24/13.  She has not repeated the strict bedrest for another 24 hours with increasing fluid intake and adding caffeine.  After explaining Epidural Blood Patch, patient does not wish to come back to our office for another procedure at this time.  Reviewed the strict bedrest instructions with patient, as well as increasing caffeine and fluid intake.  jkl

## 2013-12-15 ENCOUNTER — Other Ambulatory Visit: Payer: Self-pay

## 2013-12-15 DIAGNOSIS — Z1231 Encounter for screening mammogram for malignant neoplasm of breast: Secondary | ICD-10-CM

## 2014-01-05 ENCOUNTER — Ambulatory Visit
Admission: RE | Admit: 2014-01-05 | Discharge: 2014-01-05 | Disposition: A | Discharge status: Home / Self Care | Payer: 59 | Source: Ambulatory Visit

## 2014-01-05 ENCOUNTER — Encounter (INDEPENDENT_AMBULATORY_CARE_PROVIDER_SITE_OTHER): Payer: Self-pay

## 2014-01-05 DIAGNOSIS — Z1231 Encounter for screening mammogram for malignant neoplasm of breast: Secondary | ICD-10-CM

## 2014-04-29 ENCOUNTER — Ambulatory Visit (INDEPENDENT_AMBULATORY_CARE_PROVIDER_SITE_OTHER): Payer: 59 | Admitting: Internal Medicine

## 2014-04-29 ENCOUNTER — Encounter: Payer: Self-pay | Admitting: Internal Medicine

## 2014-04-29 VITALS — BP 163/83 | HR 70 | Temp 97.6°F | Ht 67.0 in | Wt 204.3 lb

## 2014-04-29 DIAGNOSIS — I10 Essential (primary) hypertension: Secondary | ICD-10-CM

## 2014-04-29 DIAGNOSIS — M47812 Spondylosis without myelopathy or radiculopathy, cervical region: Secondary | ICD-10-CM

## 2014-04-29 DIAGNOSIS — Z23 Encounter for immunization: Secondary | ICD-10-CM

## 2014-04-29 DIAGNOSIS — E119 Type 2 diabetes mellitus without complications: Secondary | ICD-10-CM

## 2014-04-29 DIAGNOSIS — J45909 Unspecified asthma, uncomplicated: Secondary | ICD-10-CM

## 2014-04-29 DIAGNOSIS — Z0000 Encounter for general adult medical examination without abnormal findings: Secondary | ICD-10-CM

## 2014-04-29 MED ORDER — NAPROXEN 500 MG PO TABS: 500.0000 mg | ORAL_TABLET | Freq: Two times a day (BID) | ORAL | Status: DC | PRN

## 2014-04-29 NOTE — Patient Instructions (Addendum)
General Instructions: -Continue taking your medicines for high blood pressure.  -Call and make an appointment for your eye exam. It is due in October.  -Follow up with Dr. Meredith Pel in 3 months or sooner as needed.   It was great meeting you today!  Please bring your medicines with you each time you come.   Medicines may be  Eye drops  Herbal   Vitamins  Pills  Seeing these help [place] take care of you.    Treatment Goals:  Goals (1 Years of Data) as of 04/29/14         As of Today 11/24/13 11/24/13 10/07/13 09/22/13     Blood Pressure    . Blood Pressure < 140/90  163/83 159/81 186/100 128/74 169/76     Result Component    . HEMOGLOBIN A1C < 7.0          . LDL CALC < 100            Progress Toward Treatment Goals:  Treatment Goal 04/29/2014  Hemoglobin A1C at goal  Blood pressure deteriorated    Self Care Goals & Plans:  Self Care Goal 04/29/2014  Manage my medications take my medicines as prescribed; bring my medications to every visit; refill my medications on time  Monitor my health keep track of my blood glucose; bring my glucose meter and log to each visit  Eat healthy foods drink diet soda or water instead of juice or soda; eat more vegetables; eat foods that are low in salt; eat baked foods instead of fried foods; eat fruit for snacks and desserts  Be physically active -  Meeting treatment goals -    Home Blood Glucose Monitoring 10/07/2013  Check my blood sugar 3 times a day  When to check my blood sugar before meals     Care Management & Community Referrals:  Referral 10/07/2013  Referrals made for care management support none needed  Referrals made to community resources none

## 2014-04-30 LAB — GLUCOSE, CAPILLARY: Glucose-Capillary: 114 mg/dL — ABNORMAL HIGH (ref 70–99)

## 2014-04-30 NOTE — Assessment & Plan Note (Signed)
She had flu vaccine during this visit.

## 2014-04-30 NOTE — Assessment & Plan Note (Signed)
Lab Results  Component Value Date   HGBA1C 6.3 08/05/2013   HGBA1C 6.9 04/01/2013   HGBA1C 6.9 11/12/2012     Assessment: Diabetes control: good control (HgbA1C at goal) Progress toward A1C goal:  at goal Comments: She is on Novolog 70/30 14 units with supper and metformin  BID. She denies hypoglycemia.   Plan: Medications:  continue current medications Home glucose monitoring: Frequency:   Timing:   Instruction/counseling given: reminded to get eye exam, reminded to bring blood glucose meter & log to each visit and reminded to bring medications to each visit Educational resources provided: brochure Self management tools provided:   Other plans: She will call her Ophthalmologist to make an appointment.

## 2014-04-30 NOTE — Progress Notes (Signed)
   Subjective:    Patient ID: [name] [name], female    DOB: 02/20/1936, 78 y.o.   MRN: [id number]  Neck Pain  Pertinent negatives include no fever.   [name] [name] is a 78 year old pleasant woman with PMH of DM2, HTN, asthma, cervical and lumbar spondylosis, who presents for diabetes, HTN, and neck pain. She had cervical CT myelogram recently after seeing her Neurosurgeon, Dr. Conchita Paris. She reports that her neck pain is persistent with numbness and tingling extending down her right arm and hand. She does not have follow up appointment with her Neurosurgeon yet. She has taken naproxen in the past for this neck pain with improvement of her symptoms and would like refill of this medication.    Review of Systems  Constitutional: Negative for fever, chills, activity change, appetite change, fatigue and unexpected weight change.  HENT: Negative for congestion and sneezing.   Respiratory: Negative for cough and shortness of breath.   Gastrointestinal: Negative for abdominal pain.  Genitourinary: Negative for dysuria and frequency.  Musculoskeletal: Positive for neck pain.  Neurological: Negative for dizziness and light-headedness.  Psychiatric/Behavioral: Negative for agitation.       Objective:   Physical Exam  Nursing note and vitals reviewed. Constitutional: She is oriented to person, place, and time. She appears well-nourished. No distress.  Eyes: Conjunctivae are normal. No scleral icterus.  Cardiovascular: Normal rate and regular rhythm.   Pulmonary/Chest: Effort normal and breath sounds normal. No respiratory distress. She has no wheezes. She has no rales.  Abdominal: Soft. There is no tenderness.  Musculoskeletal: She exhibits no edema and no tenderness.  Neurological: She is alert and oriented to person, place, and time.  Grip strength 5/5 bilaterally, left neck TTP with some limitation of ROM due to pain  Skin: Skin is warm and dry. She is not diaphoretic.  Psychiatric: She has  a normal mood and affect.          Assessment & Plan:

## 2014-04-30 NOTE — Assessment & Plan Note (Signed)
Well controlled, denies recent attacks. Continues to use symbicort BID and albuterol as needed.

## 2014-04-30 NOTE — Assessment & Plan Note (Signed)
BP Readings from Last 3 Encounters:  04/29/14 163/83  11/24/13 159/81  10/07/13 128/74    Lab Results  Component Value Date   NA 143 09/22/2013   K 3.7 09/22/2013   CREATININE 0.82 09/22/2013    Assessment: Blood pressure control: mildly elevated Progress toward BP goal:  deteriorated Comments: She is on diovan 160-12.5mg  daily but had not taken this medication in the morning of her appointment with BP elevated during this visit.   Plan: Medications:  continue current medications Educational resources provided:   Self management tools provided:   Other plans: Continue to monitor, pt advised to take her medication prior to her next appointment.

## 2014-04-30 NOTE — Assessment & Plan Note (Signed)
She is symptomatic. She has seen Dr. Suzette Battiest in Neurosurgery and had cervical CT myelogram. She will follow up with his next week to discuss the results and further treatment options.  -Rx naproxen  BID PRN for the neck pain.

## 2014-05-01 NOTE — Progress Notes (Signed)
Case discussed with Dr. Kennerly soon after the resident saw the patient.  We reviewed the resident's history and exam and pertinent patient test results.  I agree with the assessment, diagnosis, and plan of care documented in the resident's note. 

## 2014-06-24 LAB — HM DIABETES EYE EXAM

## 2014-08-04 ENCOUNTER — Encounter: Payer: Self-pay | Admitting: Internal Medicine

## 2014-08-04 ENCOUNTER — Ambulatory Visit (INDEPENDENT_AMBULATORY_CARE_PROVIDER_SITE_OTHER): Payer: 59 | Admitting: Internal Medicine

## 2014-08-04 VITALS — BP 153/57 | HR 86 | Temp 98.2°F | Wt 208.2 lb

## 2014-08-04 DIAGNOSIS — E119 Type 2 diabetes mellitus without complications: Secondary | ICD-10-CM

## 2014-08-04 DIAGNOSIS — E785 Hyperlipidemia, unspecified: Secondary | ICD-10-CM

## 2014-08-04 DIAGNOSIS — J45909 Unspecified asthma, uncomplicated: Secondary | ICD-10-CM

## 2014-08-04 DIAGNOSIS — Z23 Encounter for immunization: Secondary | ICD-10-CM

## 2014-08-04 DIAGNOSIS — M47812 Spondylosis without myelopathy or radiculopathy, cervical region: Secondary | ICD-10-CM

## 2014-08-04 DIAGNOSIS — I10 Essential (primary) hypertension: Secondary | ICD-10-CM

## 2014-08-04 LAB — GLUCOSE, CAPILLARY: GLUCOSE-CAPILLARY: 132 mg/dL — AB (ref 70–99)

## 2014-08-04 LAB — POCT GLYCOSYLATED HEMOGLOBIN (HGB A1C): HEMOGLOBIN A1C: 6.2

## 2014-08-04 MED ORDER — DOCUSATE SODIUM 100 MG PO CAPS: 100.0000 mg | ORAL_CAPSULE | Freq: Every day | ORAL | Status: AC | PRN

## 2014-08-04 MED ORDER — SENNOSIDES-DOCUSATE SODIUM 8.6-50 MG PO TABS: 1.0000 | ORAL_TABLET | Freq: Every day | ORAL | Status: DC | PRN

## 2014-08-04 NOTE — Assessment & Plan Note (Signed)
Lab Results  Component Value Date   HGBA1C 6.2 08/04/2014   HGBA1C 6.3 08/05/2013   HGBA1C 6.9 04/01/2013     Assessment: Diabetes control: good control (HgbA1C at goal) Progress toward A1C goal:  at goal Comments: A1c is at goal on metformin 500 mg twice a day and NovoLog mix 70/30 insulin 14 units daily with supper.  Plan: Medications:  continue current medications; follow-up with endocrinologist Dr. Evlyn [facility] next week as scheduled Home glucose monitoring: Frequency: 3 times a day Timing: before meals Instruction/counseling given: reminded to bring blood glucose meter & log to each visit and reminded to bring medications to each visit Educational resources provided: brochure Self management tools provided: home glucose logbook Other plans: Patient reports that she had an eye exam about 1 month ago; plan is to request a copy of that report.

## 2014-08-04 NOTE — Assessment & Plan Note (Signed)
BP Readings from Last 3 Encounters:  08/04/14 153/57  04/29/14 163/83  11/24/13 159/81    Lab Results  Component Value Date   NA 143 09/22/2013   K 3.7 09/22/2013   CREATININE 0.82 09/22/2013    Assessment: Blood pressure control: mildly elevated Progress toward BP goal:  unchanged Comments: Blood pressure is mildly elevated on valsartan-hydrochlorothiazide 160-12.5 mg daily, but patient did not take her medication this morning before coming to clinic.  Plan: Medications:  continue current medications Educational resources provided: brochure Self management tools provided: home blood pressure logbook

## 2014-08-04 NOTE — Assessment & Plan Note (Signed)
Assessment: Patient is doing well on albuterol and Symbicort inhalers.  Plan: Continue albuterol and Symbicort inhalers.

## 2014-08-04 NOTE — Progress Notes (Signed)
   Subjective:    Patient ID: [name] [name], female    DOB: 06/04/1936, 78 y.o.   MRN: [id number]  HPI Patient returns for management of her chronic medical problems including diabetes mellitus, hypertension, cervical spondylosis, hyperlipidemia, and other issues.  Since her last visit here, she saw her neurosurgeon Dr.Nundkumar regarding her cervical spondylosis in September, and he recommended decompressive surgery, which she has so far declined.  She reports stable mild right grip weakness and right leg weakness which she says are unchanged.  She currently is not having much in the way of neck pain or right upper extremity pain.  She reports that her blood sugars are reasonably well controlled; she did not bring her meter to clinic today.  She reports that she is compliant with her medications, although she did not take her blood pressure medicines this morning before coming to clinic.  She sees Dr. Evlyn [facility] for management of her diabetes, and she has an appointment scheduled with him next week.  Other complaints include occasional tingling around her mouth with no other associated symptoms which is transient; she also reports some episodes of sore bumps in her nose.  She denies any bowel or bladder problems, or any new or progressive neurologic symptoms since she saw her neurosurgeon in September.  She has stable mild exertional dyspnea, but reports that overall she is doing well on her current inhaler regimen.   Review of Systems  Respiratory: Positive for shortness of breath (Stable exertional shortness of breath.). Negative for cough and wheezing.   Cardiovascular: Negative for chest pain and leg swelling.  Gastrointestinal: Negative for nausea, vomiting, abdominal pain and blood in stool.  Genitourinary: Negative for dysuria and frequency.  Musculoskeletal: Positive for back pain (Chronic). Negative for neck pain.  Neurological: Positive for weakness (Mild right grip weakness, chronic) and  numbness (Fingertips right hand). Negative for dizziness and syncope.       Objective:   Physical Exam  Constitutional: No distress.  Cardiovascular: Normal rate, regular rhythm and normal heart sounds.  Exam reveals no gallop and no friction rub.   No murmur heard. Pulmonary/Chest: Effort normal and breath sounds normal. No respiratory distress. She has no wheezes. She has no rales.  Abdominal: Soft. Bowel sounds are normal. She exhibits no distension. There is no tenderness. There is no rebound and no guarding.  Neurological:  4+/5 right grip strength; 5-/5 right knee flexion/extension        Assessment & Plan:

## 2014-08-04 NOTE — Assessment & Plan Note (Addendum)
Assessment: Patient's pain is doing well at present; she has stable mild right sided weakness which she says is unchanged since her visit to her neurosurgeon in September.  He advised decompressive surgery which she has so far declined.  She stated today that she does not wish to have surgery unless her symptoms worsen.  Plan: Continue naproxen on an as-needed basis; I again discussed side effects with patient and explained that she should limit the use of this medication as much as possible.  I advised her to follow-up with her neurosurgeon to be sure he follows her clinical status.

## 2014-08-04 NOTE — Patient Instructions (Addendum)
General Instructions: Please complete and return the Hemoccult cards as instructed. Please follow-up with Dr. Evlyn [facility] as scheduled.   Progress Toward Treatment Goals:  Treatment Goal 08/04/2014  Hemoglobin A1C at goal  Blood pressure unchanged    Self Care Goals & Plans:  Self Care Goal 08/04/2014  Manage my medications take my medicines as prescribed; refill my medications on time  Monitor my health keep track of my blood glucose  Eat healthy foods eat foods that are low in salt; eat baked foods instead of fried foods; eat fruit for snacks and desserts  Be physically active find an activity I enjoy  Meeting treatment goals maintain the current self-care plan    Home Blood Glucose Monitoring 08/04/2014  Check my blood sugar 3 times a day  When to check my blood sugar before meals     Care Management & Community Referrals:  Referral 08/04/2014  Referrals made for care management support none needed  Referrals made to community resources none

## 2014-08-04 NOTE — Assessment & Plan Note (Addendum)
Lipids:    Component Value Date/Time   CHOL 161 03/31/2013 1638   TRIG 74 03/31/2013 1638   HDL 80 03/31/2013 1638   LDLCALC 66 03/31/2013 1638   VLDL 15 03/31/2013 1638   CHOLHDL 2.0 03/31/2013 1638    Assessment: Patient is doing well on simvastatin 40 mg daily, with no apparent side effects.  Plan: Check a lipid panel today; continue simvastatin 40 mg daily pending the result.

## 2014-08-05 LAB — COMPLETE METABOLIC PANEL WITH GFR
ALT: 9 U/L (ref 0–35)
AST: 12 U/L (ref 0–37)
Albumin: 3.8 g/dL (ref 3.5–5.2)
Alkaline Phosphatase: 79 U/L (ref 39–117)
BUN: 16 mg/dL (ref 6–23)
CO2: 29 meq/L (ref 19–32)
CREATININE: 0.87 mg/dL (ref 0.50–1.10)
Calcium: 10 mg/dL (ref 8.4–10.5)
Chloride: 102 mEq/L (ref 96–112)
GFR, EST AFRICAN AMERICAN: 75 mL/min
GFR, EST NON AFRICAN AMERICAN: 65 mL/min
Glucose, Bld: 140 mg/dL — ABNORMAL HIGH (ref 70–99)
Potassium: 3.8 mEq/L (ref 3.5–5.3)
Sodium: 141 mEq/L (ref 135–145)
Total Bilirubin: 0.4 mg/dL (ref 0.2–1.2)
Total Protein: 7.1 g/dL (ref 6.0–8.3)

## 2014-08-05 LAB — CBC WITH DIFFERENTIAL/PLATELET
Basophils Absolute: 0.1 10*3/uL (ref 0.0–0.1)
Basophils Relative: 1 % (ref 0–1)
EOS ABS: 0.3 10*3/uL (ref 0.0–0.7)
Eosinophils Relative: 4 % (ref 0–5)
HCT: 38.1 % (ref 36.0–46.0)
Hemoglobin: 12.4 g/dL (ref 12.0–15.0)
LYMPHS PCT: 31 % (ref 12–46)
Lymphs Abs: 2.3 10*3/uL (ref 0.7–4.0)
MCH: 27.6 pg (ref 26.0–34.0)
MCHC: 32.5 g/dL (ref 30.0–36.0)
MCV: 84.9 fL (ref 78.0–100.0)
MPV: 9.2 fL — AB (ref 9.4–12.4)
Monocytes Absolute: 0.5 10*3/uL (ref 0.1–1.0)
Monocytes Relative: 7 % (ref 3–12)
NEUTROS PCT: 57 % (ref 43–77)
Neutro Abs: 4.2 10*3/uL (ref 1.7–7.7)
Platelets: 321 10*3/uL (ref 150–400)
RBC: 4.49 MIL/uL (ref 3.87–5.11)
RDW: 14.9 % (ref 11.5–15.5)
WBC: 7.4 10*3/uL (ref 4.0–10.5)

## 2014-08-05 LAB — LIPID PANEL
Cholesterol: 137 mg/dL (ref 0–200)
HDL: 77 mg/dL (ref >39)
LDL Cholesterol: 47 mg/dL (ref 0–99)
TRIGLYCERIDES: 65 mg/dL (ref <150)
Total CHOL/HDL Ratio: 1.8 Ratio
VLDL: 13 mg/dL (ref 0–40)

## 2014-08-16 ENCOUNTER — Other Ambulatory Visit: Payer: Self-pay | Admitting: Endocrinology

## 2014-08-16 ENCOUNTER — Ambulatory Visit
Admission: RE | Admit: 2014-08-16 | Discharge: 2014-08-16 | Disposition: A | Discharge status: Home / Self Care | Payer: 59 | Source: Ambulatory Visit | Attending: Endocrinology | Admitting: Endocrinology

## 2014-08-16 DIAGNOSIS — R109 Unspecified abdominal pain: Secondary | ICD-10-CM

## 2014-08-30 ENCOUNTER — Ambulatory Visit (INDEPENDENT_AMBULATORY_CARE_PROVIDER_SITE_OTHER): Payer: Medicare Other | Admitting: Internal Medicine

## 2014-08-30 ENCOUNTER — Encounter: Payer: Self-pay | Admitting: Internal Medicine

## 2014-08-30 VITALS — BP 146/72 | HR 78 | Temp 97.8°F | Ht 67.0 in | Wt 205.6 lb

## 2014-08-30 DIAGNOSIS — E119 Type 2 diabetes mellitus without complications: Secondary | ICD-10-CM

## 2014-08-30 DIAGNOSIS — M545 Low back pain, unspecified: Secondary | ICD-10-CM | POA: Insufficient documentation

## 2014-08-30 LAB — GLUCOSE, CAPILLARY: GLUCOSE-CAPILLARY: 118 mg/dL — AB (ref 70–99)

## 2014-08-30 MED ORDER — TIZANIDINE HCL 4 MG PO TABS: 4.0000 mg | ORAL_TABLET | Freq: Every day | ORAL | Status: DC

## 2014-08-30 NOTE — Patient Instructions (Addendum)
Thank you for coming to clinic today [name] [name].  General instructions: -Your symptoms are most consistent with musculoskeletal back pain. -This usually resolves on it own within 6 weeks. -Continue alternating naproxen and tramadol to help with the pain.  You can take both twice a day. -I wrote you a prescription for a muscle relaxant that you can take at bedtime.  Be careful with this medication because it could make you drowsy or dizzy.  If you have these side effects, stop taking it. -Please make a follow up appointment to return to clinic in 3 weeks to make sure your back pain is getting better.  If it is not, we can refer you back to sport medicine.   Please try to bring all your medicines next time. This helps [place] take good care of you and stops mistakes from medicines that could hurt you.  Tizanidine tablets or capsules What is this medicine? TIZANIDINE (tye ZAN i deen) helps to relieve muscle spasms. It may be used to help in the treatment of multiple sclerosis and spinal cord injury. This medicine may be used for other purposes; ask your health care provider or pharmacist if you have questions. COMMON BRAND NAME(S): Zanaflex What should I tell my health care provider before I take this medicine? They need to know if you have any of these conditions: -kidney disease -liver disease -low blood pressure -mental disorder -an unusual or allergic reaction to tizanidine, other medicines, lactose (tablets only), foods, dyes, or preservatives -pregnant or trying to get pregnant -breast-feeding How should I use this medicine? Take this medicine by mouth with a full glass of water. Take this medicine on an empty stomach, at least 30 minutes before or 2 hours after food. Do not take with food unless you talk with your doctor. Follow the directions on the prescription label. Take your medicine at regular intervals. Do not take your medicine more often than directed. Do not stop taking except on  your doctor's advice. Suddenly stopping the medicine can be very dangerous. Talk to your pediatrician regarding the use of this medicine in children. Patients over 12 years old may have a stronger reaction and need a smaller dose. Overdosage: If you think you have taken too much of this medicine contact a poison control center or emergency room at once. NOTE: This medicine is only for you. Do not share this medicine with others. What if I miss a dose? If you miss a dose, take it as soon as you can. If it is almost time for your next dose, take only that dose. Do not take double or extra doses. What may interact with this medicine? Do not take this medicine with any of the following medications: -ciprofloxacin -clonidine -fluvoxamine -guanabenz -guanfacine -methyldopa This medicine may also interact with the following medications: -acyclovir -alcohol -antihistamines -baclofen -barbiturates like phenobarbital -benzodiazepines -cimetidine -famotidine -female hormones, like estrogens or progestins and birth control pills -medicines for high blood pressure -medicines for irregular heartbeat -medicines for pain like codeine, morphine, and hydrocodone -medicines for sleep -rofecoxib -some antibiotics like levofloxacin, ofloxacin -ticlopidine -zileuton This list may not describe all possible interactions. Give your health care provider a list of all the medicines, herbs, non-prescription drugs, or dietary supplements you use. Also tell them if you smoke, drink alcohol, or use illegal drugs. Some items may interact with your medicine. What should I watch for while using this medicine? You may get drowsy or dizzy. Do not drive, use machinery, or do anything that  needs mental alertness until you know how this medicine affects you. Do not stand or sit up quickly, especially if you are an older patient. This reduces the risk of dizzy or fainting spells. Alcohol may interfere with the effect of  this medicine. Avoid alcoholic drinks. Your mouth may get dry. Chewing sugarless gum or sucking hard candy, and drinking plenty of water may help. Contact your doctor if the problem does not go away or is severe. What side effects may I notice from receiving this medicine? Side effects that you should report to your doctor or health care professional as soon as possible: -allergic reactions like skin rash, itching or hives, swelling of the face, lips, or tongue -blurred vision -fainting spells -hallucinations -nausea or vomiting -nervousness -redness, blistering, peeling or loosening of the skin, including inside the mouth -slow or irregular heartbeat, palpitations, or chest pain -yellowing of the skin or eyes Side effects that usually do not require medical attention (report to your doctor or health care professional if they continue or are bothersome): -dizziness -drowsiness -dry mouth -tiredness or weakness This list may not describe all possible side effects. Call your doctor for medical advice about side effects. You may report side effects to FDA at 1-800-FDA-1088. Where should I keep my medicine? Keep out of the reach of children. Store at room temperature between 15 and 30 degrees C (59 and 86 degrees F). Throw away any unused medicine after the expiration date. NOTE: This sheet is a summary. It may not cover all possible information. If you have questions about this medicine, talk to your doctor, pharmacist, or health care provider.  2015, Elsevier/Gold Standard. (2008-04-29 12:38:02)   Back Pain, Adult Low back pain is very common. About 1 in 5 people have back pain.The cause of low back pain is rarely dangerous. The pain often gets better over time.About half of people with a sudden onset of back pain feel better in just 2 weeks. About 8 in 10 people feel better by 6 weeks.  CAUSES Some common causes of back pain include:  Strain of the muscles or ligaments supporting the  spine.  Wear and tear (degeneration) of the spinal discs.  Arthritis.  Direct injury to the back. DIAGNOSIS Most of the time, the direct cause of low back pain is not known.However, back pain can be treated effectively even when the exact cause of the pain is unknown.Answering your caregiver's questions about your overall health and symptoms is one of the most accurate ways to make sure the cause of your pain is not dangerous. If your caregiver needs more information, he or she may order lab work or imaging tests (X-rays or MRIs).However, even if imaging tests show changes in your back, this usually does not require surgery. HOME CARE INSTRUCTIONS For many people, back pain returns.Since low back pain is rarely dangerous, it is often a condition that people can learn to [facility] their own.   Remain active. It is stressful on the back to sit or stand in one place. Do not sit, drive, or stand in one place for more than 30 minutes at a time. Take short walks on level surfaces as soon as pain allows.Try to increase the length of time you walk each day.  Do not stay in bed.Resting more than 1 or 2 days can delay your recovery.  Do not avoid exercise or work.Your body is made to move.It is not dangerous to be active, even though your back may hurt.Your back will likely  heal faster if you return to being active before your pain is gone.  Pay attention to your body when you bend and lift. Many people have less discomfortwhen lifting if they bend their knees, keep the load close to their bodies,and avoid twisting. Often, the most comfortable positions are those that put less stress on your recovering back.  Find a comfortable position to sleep. Use a firm mattress and lie on your side with your knees slightly bent. If you lie on your back, put a pillow under your knees.  Only take over-the-counter or prescription medicines as directed by your caregiver. Over-the-counter medicines to reduce  pain and inflammation are often the most helpful.Your caregiver may prescribe muscle relaxant drugs.These medicines help dull your pain so you can more quickly return to your normal activities and healthy exercise.  Put ice on the injured area.  Put ice in a plastic bag.  Place a towel between your skin and the bag.  Leave the ice on for 15-20 minutes, 03-04 times a day for the first 2 to 3 days. After that, ice and heat may be alternated to reduce pain and spasms.  Ask your caregiver about trying back exercises and gentle massage. This may be of some benefit.  Avoid feeling anxious or stressed.Stress increases muscle tension and can worsen back pain.It is important to recognize when you are anxious or stressed and learn ways to manage it.Exercise is a great option. SEEK MEDICAL CARE IF:  You have pain that is not relieved with rest or medicine.  You have pain that does not improve in 1 week.  You have new symptoms.  You are generally not feeling well. SEEK IMMEDIATE MEDICAL CARE IF:   You have pain that radiates from your back into your legs.  You develop new bowel or bladder control problems.  You have unusual weakness or numbness in your arms or legs.  You develop nausea or vomiting.  You develop abdominal pain.  You feel faint. Document Released: 08/13/2005 Document Revised: 02/12/2012 Document Reviewed: 12/15/2013 [facility] Patient Information 2015 [place], [place]. This information is not intended to replace advice given to you by your health care provider. Make sure you discuss any questions you have with your health care provider.

## 2014-08-30 NOTE — Progress Notes (Signed)
   Subjective:    Patient ID: [name] [name], female    DOB: 1936-08-11, 79 y.o.   MRN: [id number]  HPI [name] [name] is a 79 year old woman with history of DM2, HTN, HLD, and cervical and lumbar spondylosis presenting with back pain  She reports lower back pain for the past three weeks.  She describes the pain as sharp and more on the right side.  It is worse with any movement.  She denies any incident in which she may have injured her back, but it has continued to get worse.  She has chronic sciatica, but this is unchanged.  She denies new lower extremity weakness, numbness, bowel or bladder incontinence, fevers, or chills.  She went to see her endocrinologist, Dr. Evlyn Kanner, who ordered a CT scan because he thought she may have a kidney stone, which showed known spinal stenosis but no kidney stone.  He prescribed tramadol, which has given her some relief. He also prescribed Vicodin, but it made her nauseous, so she can't take it.  She has been taking naproxen with the tramadol, and she thinks this helps some as well.  Her pain is not related to bowel movements, and she had a normal bowel movement yesterday.  She has had surgery on her lower back in the past, and she is not interested in having surgery again.  She has had epidural steroid injections in the past that helped with her back pain, but she has not had any injections in the last 10 years.  Review of Systems  Constitutional: Negative for fever, chills and fatigue.  HENT: Negative for congestion, rhinorrhea and sore throat.   Respiratory: Negative for chest tightness, shortness of breath and wheezing.   Cardiovascular: Negative for chest pain.  Gastrointestinal: Negative for nausea, vomiting, abdominal pain, diarrhea, constipation and blood in stool.  Genitourinary: Negative for dysuria, hematuria and difficulty urinating.  Musculoskeletal: Positive for myalgias. Negative for arthralgias.  Skin: Negative for rash.  Neurological:  Negative for dizziness, weakness and numbness.       Objective:   Physical Exam  Constitutional: She is oriented to person, place, and time. She appears well-developed and well-nourished. No distress.  HENT:  Head: Normocephalic and atraumatic.  Eyes: Conjunctivae and EOM are normal. Pupils are equal, round, and reactive to light.  Neck: Normal range of motion.  Cardiovascular: Normal rate, regular rhythm and normal heart sounds.   Pulmonary/Chest: Effort normal and breath sounds normal. No respiratory distress.  Abdominal: Soft. Bowel sounds are normal. She exhibits no distension. There is no tenderness.  Musculoskeletal: Normal range of motion. She exhibits tenderness (Palpation of R lower back above iliac crest, extension, flexion, and rotation of back.). She exhibits no edema.  Straight leg raise negative.  Neurological: She is alert and oriented to person, place, and time. No cranial nerve deficit. She exhibits normal muscle tone.  5/5 strength in all extremities.  Skin: Skin is warm and dry. No rash noted.          Assessment & Plan:  Please see problem-based assessment and plan.

## 2014-08-30 NOTE — Assessment & Plan Note (Addendum)
Symptoms and pain with palpation are most consistent with acute musculoskeletal low back pain that is different from her chronic lumbar stenosis.  CT abdomen and pelvis revealed not new pathology with no evidence of kidney stone.  No abdominal pain or GI complaints.  She has no red flag symptoms, and the duration of her pain is only 3 weeks.  She is getting good relief from tramadol and naproxen.  Will pursue conservative management for now with referral back to sports medicine if she doesn't respond because steroid injections have worked for her in the past. -Continue tramadol 50 mg BID and naproxen 500 mg BID alternating.  She says she has enough tramadol to last her the next couple of weeks. -Tizanidine 4 mg QHS. Advised her that this could make her drowsy or dizzy.  Told her to stop if she does not tolerate the medication. -Return to clinic in 3 weeks. -If symptoms have not improved, will refer to sports medicine in case they are related to spinal stenosis because she may get relief from epidural steroid injections.

## 2014-08-31 NOTE — Progress Notes (Signed)
Internal Medicine Clinic Attending  Case discussed with Dr. Moding at the time of the visit.  We reviewed the resident's history and exam and pertinent patient test results.  I agree with the assessment, diagnosis, and plan of care documented in the resident's note. 

## 2014-09-03 ENCOUNTER — Emergency Department (HOSPITAL_COMMUNITY)
Admission: EM | Admit: 2014-09-03 | Discharge: 2014-09-03 | Disposition: A | Discharge status: Home / Self Care | Payer: Medicare Other | Attending: Emergency Medicine | Admitting: Emergency Medicine

## 2014-09-03 ENCOUNTER — Telehealth: Payer: Self-pay | Admitting: *Deleted

## 2014-09-03 ENCOUNTER — Emergency Department (HOSPITAL_COMMUNITY)
Admission: EM | Admit: 2014-09-03 | Discharge: 2014-09-03 | Discharge status: Absent without leave | Payer: Medicare Other | Source: Home / Self Care

## 2014-09-03 ENCOUNTER — Emergency Department (HOSPITAL_COMMUNITY): Payer: Medicare Other

## 2014-09-03 ENCOUNTER — Encounter (HOSPITAL_COMMUNITY): Payer: Self-pay | Admitting: *Deleted

## 2014-09-03 DIAGNOSIS — Z79899 Other long term (current) drug therapy: Secondary | ICD-10-CM | POA: Insufficient documentation

## 2014-09-03 DIAGNOSIS — S79911A Unspecified injury of right hip, initial encounter: Secondary | ICD-10-CM | POA: Diagnosis not present

## 2014-09-03 DIAGNOSIS — K219 Gastro-esophageal reflux disease without esophagitis: Secondary | ICD-10-CM | POA: Insufficient documentation

## 2014-09-03 DIAGNOSIS — E669 Obesity, unspecified: Secondary | ICD-10-CM | POA: Diagnosis not present

## 2014-09-03 DIAGNOSIS — I10 Essential (primary) hypertension: Secondary | ICD-10-CM | POA: Insufficient documentation

## 2014-09-03 DIAGNOSIS — S8991XA Unspecified injury of right lower leg, initial encounter: Secondary | ICD-10-CM | POA: Diagnosis not present

## 2014-09-03 DIAGNOSIS — Z8669 Personal history of other diseases of the nervous system and sense organs: Secondary | ICD-10-CM | POA: Insufficient documentation

## 2014-09-03 DIAGNOSIS — M545 Low back pain, unspecified: Secondary | ICD-10-CM

## 2014-09-03 DIAGNOSIS — S3992XA Unspecified injury of lower back, initial encounter: Secondary | ICD-10-CM | POA: Diagnosis present

## 2014-09-03 DIAGNOSIS — Y998 Other external cause status: Secondary | ICD-10-CM | POA: Diagnosis not present

## 2014-09-03 DIAGNOSIS — W19XXXA Unspecified fall, initial encounter: Secondary | ICD-10-CM

## 2014-09-03 DIAGNOSIS — F419 Anxiety disorder, unspecified: Secondary | ICD-10-CM | POA: Insufficient documentation

## 2014-09-03 DIAGNOSIS — J45909 Unspecified asthma, uncomplicated: Secondary | ICD-10-CM | POA: Diagnosis not present

## 2014-09-03 DIAGNOSIS — Y92009 Unspecified place in unspecified non-institutional (private) residence as the place of occurrence of the external cause: Secondary | ICD-10-CM | POA: Diagnosis not present

## 2014-09-03 DIAGNOSIS — I509 Heart failure, unspecified: Secondary | ICD-10-CM | POA: Diagnosis not present

## 2014-09-03 DIAGNOSIS — Z8739 Personal history of other diseases of the musculoskeletal system and connective tissue: Secondary | ICD-10-CM | POA: Insufficient documentation

## 2014-09-03 DIAGNOSIS — I2510 Atherosclerotic heart disease of native coronary artery without angina pectoris: Secondary | ICD-10-CM | POA: Diagnosis not present

## 2014-09-03 DIAGNOSIS — E119 Type 2 diabetes mellitus without complications: Secondary | ICD-10-CM | POA: Insufficient documentation

## 2014-09-03 DIAGNOSIS — W1839XA Other fall on same level, initial encounter: Secondary | ICD-10-CM | POA: Insufficient documentation

## 2014-09-03 DIAGNOSIS — Y9301 Activity, walking, marching and hiking: Secondary | ICD-10-CM | POA: Insufficient documentation

## 2014-09-03 DIAGNOSIS — M79604 Pain in right leg: Secondary | ICD-10-CM

## 2014-09-03 MED ORDER — ONDANSETRON 4 MG PO TBDP
4.0000 mg | ORAL_TABLET | Freq: Once | ORAL | Status: AC
  Administered 2014-09-03: 4 mg via ORAL
  Filled 2014-09-03: qty 1

## 2014-09-03 MED ORDER — OXYCODONE-ACETAMINOPHEN 5-325 MG PO TABS
2.0000 | ORAL_TABLET | Freq: Once | ORAL | Status: AC
  Administered 2014-09-03: 2 via ORAL
  Filled 2014-09-03: qty 2

## 2014-09-03 MED ORDER — HYDROCODONE-ACETAMINOPHEN 5-325 MG PO TABS: 2.0000 | ORAL_TABLET | ORAL | Status: DC | PRN

## 2014-09-03 MED ORDER — METHOCARBAMOL 500 MG PO TABS: 500.0000 mg | ORAL_TABLET | Freq: Two times a day (BID) | ORAL | Status: DC

## 2014-09-03 MED ORDER — HYDROCODONE-ACETAMINOPHEN 5-325 MG PO TABS
2.0000 | ORAL_TABLET | Freq: Once | ORAL | Status: DC
  Filled 2014-09-03: qty 2

## 2014-09-03 MED ORDER — ONDANSETRON 4 MG PO TBDP: 4.0000 mg | ORAL_TABLET | Freq: Three times a day (TID) | ORAL | Status: DC | PRN

## 2014-09-03 NOTE — ED Provider Notes (Signed)
CSN: [id number]     Arrival date & time 09/03/14  1701 History   First MD Initiated Contact with Patient 09/03/14 2034     Chief Complaint  Patient presents with  . Fall      HPI  Patient presents for evaluation of back pain after a fall. Larey Seat her home this morning. She states that she went backwards but her right leg was underneath her and so she hyperflexed her hip and her knee. Complains of pain from the hip to the knee right medial groin.  She is able to ambulate, but with some pain.  Past Medical History  Diagnosis Date  . Asthma   . GERD (gastroesophageal reflux disease)   . Hypertension   . CAD (coronary artery disease)   . Obesity   . Diabetes mellitus   . Cervical spondylosis 03/26/2012  . Benign paroxysmal positional vertigo 11/05/2012  . CHF (congestive heart failure)   . Shortness of breath   . Anxiety    Past Surgical History  Procedure Laterality Date  . Cholecystectomy    . Appendectomy    . Abdominal hysterectomy    . Rotator cuff repair  2003    Right  . Lumbar laminectomy  2002  . Cesarean section    . Ear pins bilaterally     Family History  Problem Relation Age of Onset  . Cancer Mother     Question pancreatic  . Coronary artery disease Father   . Breast cancer Sister 82  . Coronary artery disease Sister 52  . Colon cancer Neg Hx    History  Substance Use Topics  . Smoking status: Never Smoker   . Smokeless tobacco: Never Used  . Alcohol Use: No   OB History    No data available     Review of Systems  Constitutional: Negative for fever, chills, diaphoresis, appetite change and fatigue.  HENT: Negative for mouth sores, sore throat and trouble swallowing.   Eyes: Negative for visual disturbance.  Respiratory: Negative for cough, chest tightness, shortness of breath and wheezing.   Cardiovascular: Negative for chest pain.  Gastrointestinal: Negative for nausea, vomiting, abdominal pain, diarrhea and abdominal distention.  Endocrine:  Negative for polydipsia, polyphagia and polyuria.  Genitourinary: Negative for dysuria, frequency and hematuria.  Musculoskeletal: Positive for myalgias and gait problem.  Skin: Negative for color change, pallor and rash.  Neurological: Negative for dizziness, syncope, light-headedness and headaches.  Hematological: Does not bruise/bleed easily.  Psychiatric/Behavioral: Negative for behavioral problems and confusion.      Allergies  Contrast media; Sulfa antibiotics; Aspirin; and Oxycodone-acetaminophen  Home Medications   Prior to Admission medications   Medication Sig Start Date End Date Taking? Authorizing Provider  albuterol (PROAIR HFA) 108 (90 BASE) MCG/ACT inhaler Inhale 2 puffs into the lungs every 6 (six) hours as needed for wheezing. 2 puffs every 4 hours as needed only  if your can't catch your breath 01/09/13   Nyoka Cowden, MD  budesonide-formoterol [facility]) 160-4.5 MCG/ACT inhaler Inhale 2 puffs into the lungs 2 (two) times daily. 01/09/13   Nyoka Cowden, MD  clonazePAM (KLONOPIN) 1 MG tablet Take 1 mg by mouth 2 (two) times daily as needed for anxiety.     Historical Provider, MD  docusate sodium (STOOL SOFTENER) 100 MG capsule Take 1 capsule (100 mg total) by mouth daily as needed for mild constipation or moderate constipation. 08/04/14   Farley Ly, MD  HYDROcodone-acetaminophen (NORCO/VICODIN) 5-325 MG per tablet Take 2  tablets by mouth every 4 (four) hours as needed. 09/03/14   Rolland PorterMark Candiace West, MD  metFORMIN (GLUCOPHAGE) 500 MG tablet Take 500 mg by mouth 2 (two) times daily with a meal.     Historical Provider, MD  methocarbamol (ROBAXIN) 500 MG tablet Take 1 tablet (500 mg total) by mouth 2 (two) times daily. 09/03/14   Rolland PorterMark Honestie Kulik, MD  naproxen (NAPROSYN) 500 MG tablet Take 1 tablet (500 mg total) by mouth 2 (two) times daily as needed (for pain). Take with food. 04/29/14   Ky BarbanSolianny D Kennerly, MD  NOVOLOG MIX 70/30 FLEXPEN (70-30) 100 UNIT/ML SUPN Inject 14 Units into  the skin daily with supper.  02/20/13   Historical Provider, MD  omeprazole (PRILOSEC) 20 MG capsule Take 1 capsule by mouth 2 (two) times daily. 07/10/13   Historical Provider, MD  ondansetron (ZOFRAN ODT) 4 MG disintegrating tablet Take 1 tablet (4 mg total) by mouth every 8 (eight) hours as needed for nausea. 09/03/14   Rolland PorterMark Spencer Cardinal, MD  senna-docusate (SENOKOT-S) 8.6-50 MG per tablet Take 1 tablet by mouth daily as needed for mild constipation or moderate constipation. 08/04/14   Farley LyJerry Dale Joines, MD  simvastatin (ZOCOR) 40 MG tablet Take 40 mg by mouth at bedtime.    Historical Provider, MD  tiZANidine (ZANAFLEX) 4 MG tablet Take 1 tablet (4 mg total) by mouth at bedtime. 08/30/14   Luisa DagoEverett Moding, MD  traMADol (ULTRAM) 50 MG tablet Take 50 mg by mouth 2 (two) times daily.    Historical Provider, MD  valsartan-hydrochlorothiazide (DIOVAN-HCT) 160-12.5 MG per tablet Take 1 tablet by mouth daily.    Historical Provider, MD   BP 133/77 mmHg  Pulse 67  Temp(Src) 98.1 F (36.7 C) (Oral)  Resp 18  SpO2 97% Physical Exam  Constitutional: She is oriented to person, place, and time. She appears well-developed and well-nourished. No distress.  HENT:  Head: Normocephalic.  Eyes: Conjunctivae are normal. Pupils are equal, round, and reactive to light. No scleral icterus.  Neck: Normal range of motion. Neck supple. No thyromegaly present.  Cardiovascular: Normal rate and regular rhythm.  Exam reveals no gallop and no friction rub.   No murmur heard. Pulmonary/Chest: Effort normal and breath sounds normal. No respiratory distress. She has no wheezes. She has no rales.  Abdominal: Soft. Bowel sounds are normal. She exhibits no distension. There is no tenderness. There is no rebound.  Musculoskeletal: Normal range of motion.       Back:       Legs: Neurological: She is alert and oriented to person, place, and time.  Skin: Skin is warm and dry. No rash noted.  Psychiatric: She has a normal mood and  affect. Her behavior is normal.    ED Course  Procedures (including critical care time) Labs Review Labs Reviewed - No data to display  Imaging Review Dg Lumbar Spine Complete  09/03/2014   CLINICAL DATA:  Patient fell wall walking. The right leg went out. Patient struck head. Pain in the low back radiating down the right leg.  EXAM: LUMBAR SPINE - COMPLETE 4+ VIEW  COMPARISON:  CT abdomen and pelvis 08/16/2014  FINDINGS: Diffuse degenerative changes throughout the lumbar spine with narrowed lumbar interspaces and associated endplate hypertrophic changes. Degenerative changes in the facet joints. Normal alignment of the lumbar spine. No vertebral compression deformities. Degenerative disc disease at L3-4 and L4-5 levels.  IMPRESSION: Degenerative changes throughout the lumbar spine. Normal alignment. No acute displaced fractures identified.   Electronically Signed  By: Burman Nieves M.D.   On: 09/03/2014 22:01   Dg Pelvis 1-2 Views  09/03/2014   CLINICAL DATA:  Patient fell wall walking. The right leg went out. Patient struck head. Pain in the low back radiating down the right leg.  EXAM: PELVIS - 1-2 VIEW  COMPARISON:  06/05/2012  FINDINGS: Degenerative changes in the lower lumbar spine and hips. There is no evidence of pelvic fracture or diastasis. No pelvic bone lesions are seen. No significant change since prior study.  IMPRESSION: Degenerative changes in the hips. No acute displaced fractures identified.   Electronically Signed   By: Burman Nieves M.D.   On: 09/03/2014 22:02   Dg Tibia/fibula Right  09/03/2014   CLINICAL DATA:  Patient fell wall walking. The right leg went out. Patient struck head. Pain in the low back radiating down the right leg.  EXAM: RIGHT TIBIA AND FIBULA - 2 VIEW  COMPARISON:  Right knee 11/23/2009  FINDINGS: Degenerative changes in the right knee with medial greater than lateral compartment narrowing and osteophytosis. Chondrocalcinosis. Vascular calcifications in  the soft tissues. No evidence of acute fracture or subluxation. No focal bone lesion or bone destruction. Bone cortex and trabecular architecture appear intact. No radiopaque soft tissue foreign bodies.  IMPRESSION: Degenerative changes in the right knee. No displaced fractures identified.   Electronically Signed   By: Burman Nieves M.D.   On: 09/03/2014 22:05   Dg Foot 2 Views Right  09/03/2014   CLINICAL DATA:  Patient fell wall walking. The right leg went out. Patient struck head. Pain in the low back radiating down the right leg.  EXAM: RIGHT FOOT - 2 VIEW  COMPARISON:  None.  FINDINGS: Degenerative changes in the first metatarsophalangeal joint. Vascular calcifications in the soft tissues. No evidence of acute fracture or subluxation. No focal bone lesion or bone destruction. Bone cortex and trabecular architecture appear intact. No radiopaque soft tissue foreign bodies.  IMPRESSION: No acute bony abnormalities.   Electronically Signed   By: Burman Nieves M.D.   On: 09/03/2014 22:06   Dg Femur, Min 2 Views Right  09/03/2014   CLINICAL DATA:  Patient fell wall walking. The right leg went out. Patient struck head. Pain in the low back radiating down the right leg.  EXAM: DG FEMUR 2+V*R*  COMPARISON:  None.  FINDINGS: Degenerative changes in the right hip and right knee. Chondrocalcinosis in the knee. No evidence of acute fracture or dislocation in the right femur. Vascular calcifications in the soft tissues.  IMPRESSION: Degenerative changes in the right hip and right knee. No acute bony abnormalities.   Electronically Signed   By: Burman Nieves M.D.   On: 09/03/2014 22:04     EKG Interpretation None      MDM   Final diagnoses:  Fall  Right-sided low back pain without sciatica  Pain of right lower extremity    X-ray show no acute processes. No fractures. No localizing joint abnormalities. I think her symptoms are consistent with contusions and x-ray may be some simple muscular pain or  positioning when she fell. She is able to ambulate. Plan is discharged home.    Rolland Porter, MD 09/03/14 2303

## 2014-09-03 NOTE — ED Notes (Signed)
Spoke with patient regarding her allergies, she states that she is not allergic to percocet but is allergic to vicodin as it makes her very nauseas. EDP made aware and meds changed.

## 2014-09-03 NOTE — ED Notes (Signed)
Patient transported to X-ray 

## 2014-09-03 NOTE — Discharge Instructions (Signed)
Back Pain, Adult Low back pain is very common. About 1 in 5 people have back pain.The cause of low back pain is rarely dangerous. The pain often gets better over time.About half of people with a sudden onset of back pain feel better in just 2 weeks. About 8 in 10 people feel better by 6 weeks.  CAUSES Some common causes of back pain include:  Strain of the muscles or ligaments supporting the spine.  Wear and tear (degeneration) of the spinal discs.  Arthritis.  Direct injury to the back. DIAGNOSIS Most of the time, the direct cause of low back pain is not known.However, back pain can be treated effectively even when the exact cause of the pain is unknown.Answering your caregiver's questions about your overall health and symptoms is one of the most accurate ways to make sure the cause of your pain is not dangerous. If your caregiver needs more information, he or she may order lab work or imaging tests (X-rays or MRIs).However, even if imaging tests show changes in your back, this usually does not require surgery. HOME CARE INSTRUCTIONS For many people, back pain returns.Since low back pain is rarely dangerous, it is often a condition that people can learn to manageon their own.   Remain active. It is stressful on the back to sit or stand in one place. Do not sit, drive, or stand in one place for more than 30 minutes at a time. Take short walks on level surfaces as soon as pain allows.Try to increase the length of time you walk each day.  Do not stay in bed.Resting more than 1 or 2 days can delay your recovery.  Do not avoid exercise or work.Your body is made to move.It is not dangerous to be active, even though your back may hurt.Your back will likely heal faster if you return to being active before your pain is gone.  Pay attention to your body when you bend and lift. Many people have less discomfortwhen lifting if they bend their knees, keep the load close to their bodies,and  avoid twisting. Often, the most comfortable positions are those that put less stress on your recovering back.  Find a comfortable position to sleep. Use a firm mattress and lie on your side with your knees slightly bent. If you lie on your back, put a pillow under your knees.  Only take over-the-counter or prescription medicines as directed by your caregiver. Over-the-counter medicines to reduce pain and inflammation are often the most helpful.Your caregiver may prescribe muscle relaxant drugs.These medicines help dull your pain so you can more quickly return to your normal activities and healthy exercise.  Put ice on the injured area.  Put ice in a plastic bag.  Place a towel between your skin and the bag.  Leave the ice on for 15-20 minutes, 03-04 times a day for the first 2 to 3 days. After that, ice and heat may be alternated to reduce pain and spasms.  Ask your caregiver about trying back exercises and gentle massage. This may be of some benefit.  Avoid feeling anxious or stressed.Stress increases muscle tension and can worsen back pain.It is important to recognize when you are anxious or stressed and learn ways to manage it.Exercise is a great option. SEEK MEDICAL CARE IF:  You have pain that is not relieved with rest or medicine.  You have pain that does not improve in 1 week.  You have new symptoms.  You are generally not feeling well. SEEK   IMMEDIATE MEDICAL CARE IF:   You have pain that radiates from your back into your legs.  You develop new bowel or bladder control problems.  You have unusual weakness or numbness in your arms or legs.  You develop nausea or vomiting.  You develop abdominal pain.  You feel faint. Document Released: 08/13/2005 Document Revised: 02/12/2012 Document Reviewed: 12/15/2013 [employer] Patient Information 2015 [employer]. This information is not intended to replace advice given to you by your health care provider. Make sure you  discuss any questions you have with your health care provider.  

## 2014-09-03 NOTE — ED Notes (Signed)
Advised of the wait time 

## 2014-09-03 NOTE — ED Notes (Signed)
Pt in stating she fell while walking, states her right leg "went out", states she hit her head, c/o pain to lower back radiating down her right leg, no distress noted

## 2014-09-03 NOTE — Telephone Encounter (Signed)
Pt called to report she fell this morning while walking through house.  Her right leg and foot went backwards when fell.  She did hit her head, but no LOC.  Her husband was at home and he helped her up.   No bruises and no bleeding. Knee is swollen she is not sure about feet.  She can stand and can put pressure on left leg.   She has taken Naprosyn without relief. She has slight headache.  [facility]  No weight bearing on right leg. She agrees and husband will drive her.

## 2014-09-03 NOTE — Telephone Encounter (Signed)
Agree. Thanks

## 2014-09-15 ENCOUNTER — Ambulatory Visit (INDEPENDENT_AMBULATORY_CARE_PROVIDER_SITE_OTHER): Payer: Medicare Other | Admitting: Internal Medicine

## 2014-09-15 ENCOUNTER — Encounter: Payer: Self-pay | Admitting: Internal Medicine

## 2014-09-15 VITALS — BP 140/75 | HR 78 | Temp 98.0°F | Ht 67.0 in | Wt 207.3 lb

## 2014-09-15 DIAGNOSIS — I129 Hypertensive chronic kidney disease with stage 1 through stage 4 chronic kidney disease, or unspecified chronic kidney disease: Secondary | ICD-10-CM

## 2014-09-15 DIAGNOSIS — M150 Primary generalized (osteo)arthritis: Secondary | ICD-10-CM

## 2014-09-15 DIAGNOSIS — N182 Chronic kidney disease, stage 2 (mild): Secondary | ICD-10-CM

## 2014-09-15 DIAGNOSIS — Y92009 Unspecified place in unspecified non-institutional (private) residence as the place of occurrence of the external cause: Principal | ICD-10-CM

## 2014-09-15 DIAGNOSIS — W19XXXA Unspecified fall, initial encounter: Secondary | ICD-10-CM

## 2014-09-15 DIAGNOSIS — M159 Polyosteoarthritis, unspecified: Secondary | ICD-10-CM

## 2014-09-15 DIAGNOSIS — Z9181 History of falling: Secondary | ICD-10-CM

## 2014-09-15 DIAGNOSIS — I10 Essential (primary) hypertension: Secondary | ICD-10-CM

## 2014-09-15 MED ORDER — TRAMADOL HCL 50 MG PO TABS: 50.0000 mg | ORAL_TABLET | Freq: Two times a day (BID) | ORAL | Status: DC | PRN

## 2014-09-15 MED ORDER — NAPROXEN 500 MG PO TABS: 500.0000 mg | ORAL_TABLET | Freq: Two times a day (BID) | ORAL | Status: DC

## 2014-09-15 MED ORDER — METHOCARBAMOL 500 MG PO TABS: 500.0000 mg | ORAL_TABLET | Freq: Three times a day (TID) | ORAL | Status: DC | PRN

## 2014-09-15 NOTE — Progress Notes (Signed)
Patient ID: [name] [name], female   DOB: 05/18/1936, 79 y.o.   MRN: [id number]     Subjective:   Patient ID: [name] [name] female   DOB: 06/13/1936 79 y.o.   MRN: [id number]  HPI: [name] [name] is a 79 y.o. very pleasant women with past medical history of hypertension, insulin-dependent Type II DM, HL, asthma, allergic rhinitis, cervical spondylosis, lumbar DDD, and GERD who presents for ED follow-up.   She was seen in the ED on 09/03/13 for a fall inside her house where she thinks her right knee may have given out causing her to fall backwards and fall on her right side onto carpet. Since the fall she has had exacerbation of her low back pain, right hip pain, right knee pain, and laterally located right foot pain (with mild swelling). She had xrays of her lumbar spine, right pelvis/femur, right tibia/fibula, and right foot which revealed no acute abnormalities. She has chronic degenerative changes of her lumbar spine, right hip, and right knee on xray imaging. She was given robaxin which helped and norco which did not help. She has been on tramadol and naproxen in the past which have helped. She denies leg weakness, paraesthesias, bladder/bowel incontinence, or additional fall/injury/trauma. She has been using a cane or walker to ambulate due to pain. She has also been unable to exercise due to pain. She has had back surgery and epidural corticosteroid injection in the past. She has never had intraarticular knee steroid injection in the past. She has never seen sports medicine in the past.      She is compliant with taking valsartan-HCTZ for hypertension. She denies chest pain, dyspnea, LE edema, headache, or lightheadedness.      Past Medical History  Diagnosis Date  . Asthma   . GERD (gastroesophageal reflux disease)   . Hypertension   . CAD (coronary artery disease)   . Obesity   . Diabetes mellitus   . Cervical spondylosis 03/26/2012  . Benign paroxysmal positional vertigo  11/05/2012  . CHF (congestive heart failure)   . Shortness of breath   . Anxiety    Current Outpatient Prescriptions  Medication Sig Dispense Refill  . albuterol (PROAIR HFA) 108 (90 BASE) MCG/ACT inhaler Inhale 2 puffs into the lungs every 6 (six) hours as needed for wheezing. 2 puffs every 4 hours as needed only  if your can't catch your breath 1 Inhaler 2  . budesonide-formoterol (SYMBICORT) 160-4.5 MCG/ACT inhaler Inhale 2 puffs into the lungs 2 (two) times daily. 1 Inhaler 11  . clonazePAM (KLONOPIN) 1 MG tablet Take 1 mg by mouth 2 (two) times daily as needed for anxiety.     . docusate sodium (STOOL SOFTENER) 100 MG capsule Take 1 capsule (100 mg total) by mouth daily as needed for mild constipation or moderate constipation. 30 capsule 3  . HYDROcodone-acetaminophen (NORCO/VICODIN) 5-325 MG per tablet Take 2 tablets by mouth every 4 (four) hours as needed. 20 tablet 0  . metFORMIN (GLUCOPHAGE) 500 MG tablet Take 500 mg by mouth 2 (two) times daily with a meal.     . methocarbamol (ROBAXIN) 500 MG tablet Take 1 tablet (500 mg total) by mouth 2 (two) times daily. 20 tablet 0  . naproxen (NAPROSYN) 500 MG tablet Take 1 tablet (500 mg total) by mouth 2 (two) times daily as needed (for pain). Take with food. 60 tablet 1  . NOVOLOG MIX 70/30 FLEXPEN (70-30) 100 UNIT/ML SUPN Inject 14 Units into the skin daily with  supper.     Marland Kitchen omeprazole (PRILOSEC) 20 MG capsule Take 1 capsule by mouth 2 (two) times daily.    . ondansetron (ZOFRAN ODT) 4 MG disintegrating tablet Take 1 tablet (4 mg total) by mouth every 8 (eight) hours as needed for nausea. 6 tablet 0  . senna-docusate (SENOKOT-S) 8.6-50 MG per tablet Take 1 tablet by mouth daily as needed for mild constipation or moderate constipation. 30 tablet 3  . simvastatin (ZOCOR) 40 MG tablet Take 40 mg by mouth at bedtime.    Marland Kitchen tiZANidine (ZANAFLEX) 4 MG tablet Take 1 tablet (4 mg total) by mouth at bedtime. 21 tablet 0  . traMADol (ULTRAM) 50 MG  tablet Take 50 mg by mouth 2 (two) times daily.    . valsartan-hydrochlorothiazide (DIOVAN-HCT) 160-12.5 MG per tablet Take 1 tablet by mouth daily.     No current facility-administered medications for this visit.   Family History  Problem Relation Age of Onset  . Cancer Mother     Question pancreatic  . Coronary artery disease Father   . Breast cancer Sister 69  . Coronary artery disease Sister 84  . Colon cancer Neg Hx    History   Social History  . Marital Status: Married    Spouse Name: N/A    Number of Children: N/A  . Years of Education: N/A   Social History Main Topics  . Smoking status: Never Smoker   . Smokeless tobacco: Never Used  . Alcohol Use: No  . Drug Use: No  . Sexual Activity: None   Other Topics Concern  . None   Social History Narrative   Patient is retired   2 children   Review of Systems: Review of Systems  Constitutional: Negative for fever and chills.  Respiratory: Negative for cough and shortness of breath.   Cardiovascular: Negative for chest pain and leg swelling.  Gastrointestinal: Negative for nausea, vomiting, abdominal pain, diarrhea and constipation.  Genitourinary: Negative for dysuria, urgency and frequency.  Musculoskeletal: Positive for back pain (acute on chronic low back pain), joint pain (right hip, knee, and foot) and falls (on 09/03/14). Negative for myalgias.  Neurological: Negative for dizziness, sensory change, focal weakness and headaches.    Objective:  Physical Exam: Filed Vitals:   09/15/14 1100 09/15/14 1101  BP:  140/75  Pulse:  78  Temp:  98 F (36.7 C)  TempSrc:  Oral  Height:  (1.702 m)   Weight: 207 lb 4.8 oz (94.031 kg)   SpO2:  99%    Physical Exam  Constitutional: She is oriented to person, place, and time. She appears well-developed and well-nourished. No distress.  HENT:  Head: Normocephalic and atraumatic.  Eyes: EOM are normal.  Neck: Normal range of motion. Neck supple.  Cardiovascular:  Normal rate and regular rhythm.   Pulmonary/Chest: Effort normal and breath sounds normal. No respiratory distress. She has no wheezes. She has no rales.  Abdominal: Soft. Bowel sounds are normal. She exhibits no distension. There is no tenderness. There is no rebound and no guarding.  Musculoskeletal:  Tenderness to palpation of right hip joint, right knee and lateral side of right foot without erythema, warmth, or swelling.   Neurological: She is alert and oriented to person, place, and time.  4/5 b/l LE muscle strength due to poor effort from pain. Normal sensation to light touch of extremities. Negative straight leg test bilaterally.  Skin: Skin is warm and dry. No rash noted. She is not diaphoretic. No  erythema. No pallor.  Psychiatric: She has a normal mood and affect. Her behavior is normal. Judgment and thought content normal.    Assessment & Plan:   Please see problem list for problem-based assessment and plan

## 2014-09-15 NOTE — Progress Notes (Signed)
Case discussed with Dr. Rabbani soon after the resident saw the patient.  We reviewed the resident's history and exam and pertinent patient test results.  I agree with the assessment, diagnosis, and plan of care documented in the resident's note. 

## 2014-09-15 NOTE — Patient Instructions (Addendum)
-  Take naproxen twice a day for 2 weeks then take as needed -Take tramadol twice a day as need for pain  -Take robaxin three times daily as needed for muscle spasms, it may make you drowsy so take near bedtime -Will refer you to sports medicine, in the mean time use a cane or walker for help walking  -So nice seeing you again!   General Instructions:   Please bring your medicines with you each time you come to clinic.  Medicines may include prescription medications, over-the-counter medications, herbal remedies, eye drops, vitamins, or other pills.   Progress Toward Treatment Goals:  Treatment Goal 08/04/2014  Hemoglobin A1C at goal  Blood pressure unchanged    Self Care Goals & Plans:  Self Care Goal 09/15/2014  Manage my medications take my medicines as prescribed; bring my medications to every visit; refill my medications on time; follow the sick day instructions if I am sick  Monitor my health keep track of my blood glucose; keep track of my blood pressure; check my feet daily  Eat healthy foods eat more vegetables; eat fruit for snacks and desserts; eat foods that are low in salt; eat baked foods instead of fried foods; drink diet soda or water instead of juice or soda  Be physically active find an activity I enjoy  Meeting treatment goals -    Home Blood Glucose Monitoring 08/04/2014  Check my blood sugar 3 times a day  When to check my blood sugar before meals     Care Management & Community Referrals:  Referral 08/04/2014  Referrals made for care management support none needed  Referrals made to community resources none

## 2014-09-16 DIAGNOSIS — M1990 Unspecified osteoarthritis, unspecified site: Secondary | ICD-10-CM | POA: Insufficient documentation

## 2014-09-16 DIAGNOSIS — Y92009 Unspecified place in unspecified non-institutional (private) residence as the place of occurrence of the external cause: Secondary | ICD-10-CM

## 2014-09-16 DIAGNOSIS — W19XXXA Unspecified fall, initial encounter: Secondary | ICD-10-CM | POA: Insufficient documentation

## 2014-09-16 NOTE — Assessment & Plan Note (Signed)
Assessment: Pt with moderately well-controlled blood pressure compliant with two-class (ARB & diuretic) anti-hypertensive therapy who presents with blood pressure of 140/75.   Plan:  -BP 140/75 at goal <140/90 -Continue valsartan-HCTZ 160-12.5 mg daily  -Last CMP on 08/04/14 with stable CKD Stage 2

## 2014-09-16 NOTE — Assessment & Plan Note (Signed)
Assessment: Pt with recent xray evidence of degenerative change of lumbar spine, right hip, and right knee with recent fall at home on 09/03/14 in setting of possible right knee instability who presents with acute on chronic low back pain, right hip pain, and right knee pain.    Plan:  -Prescribe naproxen 500 mg BID for 2 weeks then as needed  (pt with stable CKD Stage 2 and no  h/o GI bleed) -Prescribe tramadol 50 mg BID PRN pain (pt with no h/o seizure disorder)  -Refer to sports medicine for further management of acute on chronic low back pain and chronic right knee pain (with possible instability) in setting of degenerative changes most likely due to osteoarthritis. Consider corticosteroid therapy and physical therapy.

## 2014-09-16 NOTE — Assessment & Plan Note (Addendum)
Assessment: Pt with recent fall at home on 09/03/14 onto right side of body in setting of possible right knee instability who presents with mildly improved pain with muscle relaxant and narcotic therapy.   Plan:  -Prescribe naproxen 500 mg BID scheduled for anti-inflammation for 2 weeks then as needed (pt with stable CKD Stage 2 and no h/o GI bleed) -Prescribe tramadol 50 mg BID PRN pain (pt with no h/o seizure disorder)  -Prescribe robaxin  500 mg TID PRN muscle spasms (pt instructed to take at night due to SE of drowsiness)  -Pt instructed to use walker or cane to reduce weight bearing to right LE at this time -Refer to sports medicine for further management of acute on chronic low back pain and right knee pain in setting of degenerative changes most likely due to osteoarthritis

## 2014-09-27 ENCOUNTER — Ambulatory Visit (INDEPENDENT_AMBULATORY_CARE_PROVIDER_SITE_OTHER): Payer: Medicare Other | Admitting: Sports Medicine

## 2014-09-27 VITALS — BP 120/75 | HR 85 | Ht 67.0 in | Wt 204.0 lb

## 2014-09-27 DIAGNOSIS — M545 Low back pain, unspecified: Secondary | ICD-10-CM

## 2014-09-27 DIAGNOSIS — M150 Primary generalized (osteo)arthritis: Secondary | ICD-10-CM

## 2014-09-27 DIAGNOSIS — M159 Polyosteoarthritis, unspecified: Secondary | ICD-10-CM

## 2014-09-27 MED ORDER — METHYLPREDNISOLONE ACETATE 80 MG/ML IJ SUSP
80.0000 mg | Freq: Once | INTRAMUSCULAR | Status: AC
  Administered 2014-09-27: 80 mg via INTRAMUSCULAR

## 2014-09-27 NOTE — Patient Instructions (Signed)
Good to see you today.  Your low back pain and knee pain are both likely due to degenerative changes (arthritis). Physical therapy is the main treatment for this. We are referring you for this. We are also giving you a shot of steroids today which will help with inflammation. As discussed, this could increase blood sugar slightly.  Continue staying as active as possible daily. Follow up with us in 3 weeks to see how you are doing, or sooner if needed. If you have redness, swelling, warmth at the injection site, fevers or chills, seek immediate care.  Best,  Leona SingletonMaria T Gaius Ishaq, MD  Arthritis, Nonspecific Arthritis is pain, redness, warmth, or puffiness (inflammation) of a joint. The joint may be stiff or hurt when you move it. One or more joints may be affected. There are many types of arthritis. Your doctor may not know what type you have right away. The most common cause of arthritis is wear and tear on the joint (osteoarthritis). HOME CARE   Only take medicine as told by your doctor.  Rest the joint as much as possible.  Raise (elevate) your joint if it is puffy.  Use crutches if the painful joint is in your leg.  Drink enough fluids to keep your pee (urine) clear or pale yellow.  Follow your doctor's diet instructions.  Use cold packs for very bad joint pain for 10 to 15 minutes every hour. Ask your doctor if it is okay for you to use hot packs.  Exercise as told by your doctor.  Take a warm shower if you have stiffness in the morning.  Move your sore joints throughout the day. GET HELP RIGHT AWAY IF:   You have a fever.  You have very bad joint pain, puffiness, or redness.  You have many joints that are painful and puffy.  You are not getting better with treatment.  You have very bad back pain or leg weakness.  You cannot control when you poop (bowel movement) or pee (urinate).  You do not feel better in 24 hours or are getting worse.  You are having side  effects from your medicine. MAKE SURE YOU:   Understand these instructions.  Will watch your condition.  Will get help right away if you are not doing well or get worse. Document Released: 11/07/2009 Document Revised: 02/12/2012 Document Reviewed: 11/07/2009 [facility] [employer] Patient Information 2015 [place] [place]. This information is not intended to replace advice given to you by your health care provider. Make sure you discuss any questions you have with your health care provider.

## 2014-09-27 NOTE — Assessment & Plan Note (Addendum)
Right lumbar back pain with some antalgic gait is likely a flare-up of her osteoarthritis. Lumbar xray with degenerative changes; CT abd/pelvis nonacute. H/o spinal stenosis. No red flag symptoms. No midline tenderness. - PT ordered. - IM steroid injection today. - F/u 3 weeks to reassess. At that time, could consider epidural steroid injection if no better.   Knee pain likely from osteoarthritis, seen on tib/fib imaging and common in this age group especially in obese patient Has not tried PT. - PT ordered - IM steroid injection today. - F/u 3 weeks; if no improvement, trial knee steroid injections  Patient seen and evaluated with the resident. I agree with the above assessment and plan.

## 2014-09-27 NOTE — Progress Notes (Signed)
Subjective:   CC: F/u back pain, knee pain  HPI:   F/u low back pain Present about 3 weeks per patient, seen by PCP who thought this was acute MSK low back pain. CT abd/pelvis negative for acute pathology or kidney stone. Today, patient states pain is unchanged at 8/10. It is sharp, in right lumbar region, and occasionally radiates to right hip/buttock. Patient denies bowel/bladder incontinence, perineal numbness/tingling, or sudden lower extremity weakness. Tramadol, naproxen, and tizanidine help but she is scared to take so many medications. She reports h/o spinal stenosis surgery ~10 years back and epidural steroid injections around that time that worked well, but she prefers to try other options first. Of note, she fell early January  (after acute pain started), which she thinks was due to knee buckling. Pain has been worsened since then. She thinks pain does affect how she walks. Denies alleviating factors. Aggravated by certain positions while sitting or laying, though unable to clarify. Denies fevers/chills.   Knee pain, right Present intermittently x 1 year. Afraid it may buckle at any time. Thinks buckling may have led to fall ~1 month ago. Occasional swelling. Has pain at anterior and medial knee. Denies redness, fevers, or chills. Wearing sleeve/brace hurts it more so she does not wear. Heat and ice do not help. Has not done PT.   Review of Systems - Per HPI.   PMH - DM 2, chronic resp failure, OA, lumbar spondylosis with myelopathy, HLD, GERD, h/o fall, HTN, cervical spondylosis, asthma, acute low back pain    Objective:  Physical Exam BP 120/75 mmHg  Pulse 85  Ht 5\' 7"  (1.702 m)  Wt 204 lb (92.534 kg)  BMI 31.94 kg/m2 GEN: NAD MSK: No midline TTP or percussion; generalized moderate tenderness right lumbar spine, hip, and buttock with no erythema or swelling or rash; negative bilateral straight leg raise EXTR: Right knee with medial joint line mild tenderness Mild bilateral  swelling with no obvious effusion; no erythema or induration. Stable to ligamentous exam 1+ Bilateral patellar and achilles tendon reflexes 4+/5 bilateral knee flexion/extension with no obvious crepitus 4/5 right plantar flexion of foot; 5/5 left  Imaging 09/03/14 of right foot, pelvis, lumbar spine, tibia/fibula right, and femur right showed right hip, right knee, and lumbar spine degenerative changes with no bony abnormalities.   Assessment:     [name] [name] is a 79 y.o. female here for right knee pain and subacute low back pain.    Plan:     # See problem list and after visit summary for problem-specific plans.   Follow-up: Follow up in 3 weeks for f/u low back pain and right knee pain.   [name] SingletonMaria T Tanaysia Bhardwaj, MD [facility]

## 2014-09-27 NOTE — Assessment & Plan Note (Addendum)
See a/p under osteoarthritis problem.

## 2014-10-08 ENCOUNTER — Encounter: Payer: Self-pay | Admitting: Sports Medicine

## 2014-10-08 ENCOUNTER — Ambulatory Visit (INDEPENDENT_AMBULATORY_CARE_PROVIDER_SITE_OTHER): Payer: Medicare Other | Admitting: Sports Medicine

## 2014-10-08 VITALS — BP 155/86 | Ht 66.0 in | Wt 204.0 lb

## 2014-10-08 DIAGNOSIS — M25561 Pain in right knee: Secondary | ICD-10-CM

## 2014-10-08 DIAGNOSIS — M1711 Unilateral primary osteoarthritis, right knee: Secondary | ICD-10-CM

## 2014-10-08 MED ORDER — METHYLPREDNISOLONE ACETATE 40 MG/ML IJ SUSP
40.0000 mg | Freq: Once | INTRAMUSCULAR | Status: AC
  Administered 2014-10-08: 40 mg via INTRA_ARTICULAR

## 2014-10-08 NOTE — Progress Notes (Addendum)
   Subjective:    Patient ID: [name] [name], female    DOB: 08/21/1936, 79 y.o.   MRN: [id number]  HPI  Patient comes in today with persistent right knee pain. She was last seen in the office on February 1. An IM cortisone injection was administered for both low back pain and right knee pain. Her back pain resolved but her right knee pain has persisted. She is getting intermittent swelling as well. No real mechanical symptoms. She has not yet started physical therapy because of her pain. A tib/fib x-ray done a few weeks ago showed degenerative changes in her right knee. She is here today with her husband.    Review of Systems     Objective:   Physical Exam Well-developed, well-nourished. No acute distress  Right knee: Range of motion 0 to 120. No effusion. She is tender to palpation along the medial and lateral joint lines. No Baker's cyst. Negative McMurray's. Good joint stability. Neurovascular intact distally.       Assessment & Plan:  Right knee pain secondary to DJD  I recommended a cortisone injection to the right knee. This was accomplished atraumatically under sterile technique after risks and benefits were explained. An anterior lateral approach was utilized. Patient tolerated this without difficulty. We will try a body helix compression sleeve with activity. If symptoms persist I would start with getting a dedicated standing x-ray of the right knee. Follow-up as needed.  Consent obtained and verified. Time-out conducted. Noted no overlying erythema, induration, or other signs of local infection. Skin prepped in a sterile fashion. Topical analgesic spray: Ethyl chloride. Joint: right knee Needle: 22g 1.5 inch Completed without difficulty. Meds: 3cc 1% xylocaine, 1cc (40mg ) depomedrol  Advised to call if fevers/chills, erythema, induration, drainage, or persistent bleeding.   Addendum: Verbal consent was obtained prior to the injection. Risks of the procedure  including post injection steroid flare and infection were explained to both her and her husband. They both voiced their understanding and the patient was willing to proceed with the injection.

## 2014-10-11 LAB — HM DIABETES EYE EXAM

## 2014-10-19 ENCOUNTER — Encounter: Payer: Self-pay | Admitting: Sports Medicine

## 2014-10-19 ENCOUNTER — Ambulatory Visit (INDEPENDENT_AMBULATORY_CARE_PROVIDER_SITE_OTHER): Payer: Medicare Other | Admitting: Sports Medicine

## 2014-10-19 VITALS — BP 127/78 | HR 80 | Ht 66.0 in | Wt 204.0 lb

## 2014-10-19 DIAGNOSIS — M545 Low back pain: Secondary | ICD-10-CM

## 2014-10-19 NOTE — Progress Notes (Signed)
   Subjective:    Patient ID: [name] [name], female    DOB: 08/10/1936, 79 y.o.   MRN: [id number]  HPI  Patient comes in today for follow-up on right knee pain and low back pain. Right knee pain is better after a recent cortisone injection. She still gets intermittent discomfort in the anterior knee but it is not severe. Still getting some mild swelling. Body helix compression sleeve seems to be helping. Her main concern is persistent low back pain. She has a history of previous low back surgery with Dr. Fannie KneeSue in 2002. She has also had lumbar epidural steroid injections in the past. She complains of pain diffuse across her low-back with radiating discomfort into each leg. The radiculopathy that she was experiencing previously in her left leg prior to surgery still persists but she is now getting some symptoms into the right leg as well. No groin pain. Recent x-rays showed diffuse degenerative changes throughout the lumbar spine.    Review of Systems     Objective:   Physical Exam Well-developed, well-nourished. No acute distress.  Limited lumbar range of motion secondary to pain. Diffuse tenderness to palpation along the lumbosacral area. No spasm. Negative today leg raise bilaterally. Reflexes are trace but equal at the Achilles and patellar tendons bilaterally. Good strength bilaterally. No obvious atrophy.  Right knee: Full range of motion. No obvious effusion. Trace patellofemoral crepitus. No joint line tenderness to palpation. Negative McMurray's. Neurovascularly intact distally.       Assessment & Plan:  Chronic low back pain secondary to degenerative disc disease status post remote surgery for spinal stenosis Improved right knee pain likely secondary to DJD  Patient is unable to undergo an MRI of her lumbar spine. Therefore I think it would be in her best interest to refer her to Dr.Ibazebo. He may want to order some sort of CT scan to further evaluate or possibly start with  diagnostic facet or epidural steroid injections. I will defer further workup and treatment in regards to her lumbar spine to Dr.Ibazebo. Her right knee is feeling better after recent cortisone injection so I'll not pursue any further workup here. If pain returns I would consider starting with plain x-rays of the right knee to evaluate the degree of osteoarthritis present. Follow-up with me when necessary.

## 2014-10-25 ENCOUNTER — Telehealth: Payer: Self-pay | Admitting: Internal Medicine

## 2014-10-25 NOTE — Telephone Encounter (Signed)
LOV with Dr. Hanley SeamenBernstorf on 06/24/2014. OV notes being faxed

## 2014-10-26 ENCOUNTER — Encounter: Payer: Self-pay | Admitting: *Deleted

## 2014-10-27 ENCOUNTER — Other Ambulatory Visit: Payer: Self-pay | Admitting: Physical Medicine and Rehabilitation

## 2014-10-27 DIAGNOSIS — M25551 Pain in right hip: Secondary | ICD-10-CM

## 2014-10-27 DIAGNOSIS — M545 Low back pain: Secondary | ICD-10-CM

## 2014-11-02 ENCOUNTER — Ambulatory Visit
Admission: RE | Admit: 2014-11-02 | Discharge: 2014-11-02 | Disposition: A | Discharge status: Home / Self Care | Payer: Medicare Other | Source: Ambulatory Visit | Attending: Physical Medicine and Rehabilitation | Admitting: Physical Medicine and Rehabilitation

## 2014-11-02 DIAGNOSIS — M25551 Pain in right hip: Secondary | ICD-10-CM

## 2014-11-02 DIAGNOSIS — M545 Low back pain: Secondary | ICD-10-CM

## 2014-11-12 ENCOUNTER — Encounter: Payer: Self-pay | Admitting: *Deleted

## 2014-11-16 ENCOUNTER — Ambulatory Visit (INDEPENDENT_AMBULATORY_CARE_PROVIDER_SITE_OTHER): Payer: Medicare Other | Admitting: Internal Medicine

## 2014-11-16 ENCOUNTER — Encounter: Payer: Self-pay | Admitting: Internal Medicine

## 2014-11-16 VITALS — BP 133/79 | HR 69 | Temp 97.8°F | Wt 205.0 lb

## 2014-11-16 DIAGNOSIS — Z794 Long term (current) use of insulin: Secondary | ICD-10-CM | POA: Diagnosis not present

## 2014-11-16 DIAGNOSIS — J45909 Unspecified asthma, uncomplicated: Secondary | ICD-10-CM

## 2014-11-16 DIAGNOSIS — L989 Disorder of the skin and subcutaneous tissue, unspecified: Secondary | ICD-10-CM

## 2014-11-16 DIAGNOSIS — Z0000 Encounter for general adult medical examination without abnormal findings: Secondary | ICD-10-CM

## 2014-11-16 DIAGNOSIS — I10 Essential (primary) hypertension: Secondary | ICD-10-CM | POA: Diagnosis not present

## 2014-11-16 DIAGNOSIS — Z1211 Encounter for screening for malignant neoplasm of colon: Secondary | ICD-10-CM

## 2014-11-16 DIAGNOSIS — M545 Low back pain: Secondary | ICD-10-CM

## 2014-11-16 DIAGNOSIS — E785 Hyperlipidemia, unspecified: Secondary | ICD-10-CM

## 2014-11-16 DIAGNOSIS — K219 Gastro-esophageal reflux disease without esophagitis: Secondary | ICD-10-CM

## 2014-11-16 DIAGNOSIS — Z23 Encounter for immunization: Secondary | ICD-10-CM | POA: Diagnosis not present

## 2014-11-16 DIAGNOSIS — E119 Type 2 diabetes mellitus without complications: Secondary | ICD-10-CM | POA: Diagnosis not present

## 2014-11-16 DIAGNOSIS — Z7951 Long term (current) use of inhaled steroids: Secondary | ICD-10-CM

## 2014-11-16 DIAGNOSIS — M25561 Pain in right knee: Secondary | ICD-10-CM

## 2014-11-16 LAB — GLUCOSE, CAPILLARY: Glucose-Capillary: 83 mg/dL (ref 70–99)

## 2014-11-16 LAB — POCT GLYCOSYLATED HEMOGLOBIN (HGB A1C): Hemoglobin A1C: 6.5

## 2014-11-16 NOTE — Progress Notes (Signed)
   Subjective:    Patient ID: [name] [name], female    DOB: 01/07/1936, 79 y.o.   MRN: [id number]  HPI Patient returns for management of her type 2 diabetes mellitus, asthma, hypertension, hyperlipidemia, and other chronic medical problems.  She fell in January and was seen in the emergency department, as well as subsequently in our clinic, by Dr. Margaretha Sheffieldraper at sports medicine, and by [facility].  Her main complaint now following the fall is ongoing right knee pain and low back pain.  She reports having received 2 knee injections which provided some relief of her knee pain.  She has follow-up scheduled with her orthopedist.  Patient reports that she is compliant with her medications.  Her diabetes is managed by her endocrinologist Dr. Evlyn KannerSouth.   Review of Systems  Constitutional: Negative for fever, chills and diaphoresis.  Respiratory: Negative for shortness of breath.   Cardiovascular: Negative for chest pain and leg swelling.  Gastrointestinal: Negative for nausea, vomiting, abdominal pain, blood in stool and anal bleeding.  Musculoskeletal: Positive for back pain and arthralgias (Right knee pain).    I reviewed and updated the medication list, allergies, past medical history, past surgical history, family history, and social history.     Objective:   Physical Exam  Constitutional: No distress.  Cardiovascular: Normal rate, regular rhythm and normal heart sounds.  Exam reveals no gallop and no friction rub.   No murmur heard. No lower extremity edema  Pulmonary/Chest: Effort normal and breath sounds normal. No respiratory distress. She has no wheezes. She has no rales.  Abdominal: Soft. Bowel sounds are normal. She exhibits no distension. There is no tenderness. There is no rebound and no guarding.  Neurological: She has normal strength.  Straight leg raise negative bilaterally.       Assessment & Plan:

## 2014-11-16 NOTE — Patient Instructions (Addendum)
Continue current medications. A referral has been made to gastroenterology for a screening colonoscopy.

## 2014-11-17 DIAGNOSIS — M25561 Pain in right knee: Secondary | ICD-10-CM | POA: Insufficient documentation

## 2014-11-17 NOTE — Assessment & Plan Note (Signed)
Assessment: Patient has right knee pain and low back pain following a fall in January.  She is followed by an [facility]ist at [facility].  Plan: Follow-up with orthopedist as planned.

## 2014-11-17 NOTE — Assessment & Plan Note (Signed)
BP Readings from Last 3 Encounters:  11/16/14 133/79  10/19/14 127/78  10/08/14 155/86    Lab Results  Component Value Date   NA 141 08/04/2014   K 3.8 08/04/2014   CREATININE 0.87 08/04/2014    Assessment: Blood pressure control: controlled Progress toward BP goal:  at goal Comments: Blood pressure is at goal on valsartan-hydrochlorothiazide 160-12.5 mg once daily  Plan: Medications:  continue current medications Educational resources provided: brochure Self management tools provided: home blood pressure logbook

## 2014-11-17 NOTE — Assessment & Plan Note (Signed)
SpO2 Readings from Last 3 Encounters:  11/16/14 100%  09/15/14 99%  09/03/14 96%    Assessment: Patient is doing well on albuterol inhaler as needed, and Symbicort 80-4.5 one puff twice a day.  Plan: Continue albuterol and Symbicort inhalers.

## 2014-11-17 NOTE — Assessment & Plan Note (Signed)
Lipids:    Component Value Date/Time   CHOL 137 08/04/2014 1111   TRIG 65 08/04/2014 1111   TRIG 98 04/12/2009   HDL 77 08/04/2014 1111   LDLCALC 47 08/04/2014 1111   VLDL 13 08/04/2014 1111   CHOLHDL 1.8 08/04/2014 1111    Assessment: LDL is at goal on simvastatin 40 mg daily; patient has no apparent side effects of the statin.    Plan: Continue simvastatin 40 mg daily.

## 2014-11-17 NOTE — Assessment & Plan Note (Signed)
Lab Results  Component Value Date   HGBA1C 6.5 11/16/2014   HGBA1C 6.2 08/04/2014   HGBA1C 6.3 08/05/2013     Assessment: Diabetes control: good control (HgbA1C at goal) Progress toward A1C goal:  at goal Comments: Hemoglobin A1c is at goal on metformin 500 mg twice a day and NovoLog Mix 70/30 insulin 17 units daily with supper.  Plan: Medications:  continue current medications Home glucose monitoring: Frequency: 3 times a day Timing: before breakfast Instruction/counseling given: reminded to bring blood glucose meter & log to each visit Educational resources provided: brochure Self management tools provided: home glucose logbook

## 2014-11-17 NOTE — Assessment & Plan Note (Signed)
Patient agreed to referral for colonoscopy for colon cancer screening.

## 2014-11-17 NOTE — Assessment & Plan Note (Signed)
Assessment: Symptoms are well controlled on omeprazole 20 mg twice a day.  Plan: Continue omeprazole at current dose.

## 2014-11-30 ENCOUNTER — Encounter: Payer: Self-pay | Admitting: *Deleted

## 2014-12-02 ENCOUNTER — Other Ambulatory Visit: Payer: Self-pay

## 2014-12-02 DIAGNOSIS — Z1231 Encounter for screening mammogram for malignant neoplasm of breast: Secondary | ICD-10-CM

## 2014-12-27 NOTE — Addendum Note (Signed)
Addended by: Neomia DearPOWERS, Johncarlo Maalouf E on: 12/27/2014 06:38 PM   Modules accepted: Orders

## 2015-01-09 ENCOUNTER — Other Ambulatory Visit: Payer: Self-pay | Admitting: Internal Medicine

## 2015-01-10 ENCOUNTER — Ambulatory Visit
Admission: RE | Admit: 2015-01-10 | Discharge: 2015-01-10 | Disposition: A | Discharge status: Home / Self Care | Payer: Medicare Other | Source: Ambulatory Visit

## 2015-01-10 DIAGNOSIS — Z1231 Encounter for screening mammogram for malignant neoplasm of breast: Secondary | ICD-10-CM

## 2015-01-12 ENCOUNTER — Other Ambulatory Visit: Payer: Self-pay | Admitting: Internal Medicine

## 2015-01-12 DIAGNOSIS — J4530 Mild persistent asthma, uncomplicated: Secondary | ICD-10-CM

## 2015-03-03 ENCOUNTER — Observation Stay (HOSPITAL_COMMUNITY)
Admission: EM | Admit: 2015-03-03 | Discharge: 2015-03-04 | Disposition: A | Discharge status: Home / Self Care | Payer: Medicare Other | Attending: Internal Medicine | Admitting: Internal Medicine

## 2015-03-03 ENCOUNTER — Encounter (HOSPITAL_COMMUNITY): Payer: Self-pay | Admitting: *Deleted

## 2015-03-03 ENCOUNTER — Emergency Department (HOSPITAL_COMMUNITY): Payer: Medicare Other

## 2015-03-03 DIAGNOSIS — J45909 Unspecified asthma, uncomplicated: Secondary | ICD-10-CM | POA: Diagnosis not present

## 2015-03-03 DIAGNOSIS — I10 Essential (primary) hypertension: Secondary | ICD-10-CM | POA: Diagnosis not present

## 2015-03-03 DIAGNOSIS — R072 Precordial pain: Secondary | ICD-10-CM | POA: Diagnosis present

## 2015-03-03 DIAGNOSIS — J449 Chronic obstructive pulmonary disease, unspecified: Secondary | ICD-10-CM | POA: Insufficient documentation

## 2015-03-03 DIAGNOSIS — K219 Gastro-esophageal reflux disease without esophagitis: Secondary | ICD-10-CM | POA: Diagnosis present

## 2015-03-03 DIAGNOSIS — I509 Heart failure, unspecified: Secondary | ICD-10-CM | POA: Diagnosis not present

## 2015-03-03 DIAGNOSIS — F419 Anxiety disorder, unspecified: Secondary | ICD-10-CM | POA: Insufficient documentation

## 2015-03-03 DIAGNOSIS — R0789 Other chest pain: Principal | ICD-10-CM | POA: Diagnosis present

## 2015-03-03 DIAGNOSIS — Z6832 Body mass index (BMI) 32.0-32.9, adult: Secondary | ICD-10-CM | POA: Diagnosis not present

## 2015-03-03 DIAGNOSIS — Z791 Long term (current) use of non-steroidal anti-inflammatories (NSAID): Secondary | ICD-10-CM | POA: Diagnosis not present

## 2015-03-03 DIAGNOSIS — E860 Dehydration: Secondary | ICD-10-CM | POA: Insufficient documentation

## 2015-03-03 DIAGNOSIS — E785 Hyperlipidemia, unspecified: Secondary | ICD-10-CM | POA: Diagnosis not present

## 2015-03-03 DIAGNOSIS — Z794 Long term (current) use of insulin: Secondary | ICD-10-CM | POA: Diagnosis not present

## 2015-03-03 DIAGNOSIS — E669 Obesity, unspecified: Secondary | ICD-10-CM | POA: Insufficient documentation

## 2015-03-03 DIAGNOSIS — M549 Dorsalgia, unspecified: Secondary | ICD-10-CM

## 2015-03-03 DIAGNOSIS — Z9114 Patient's other noncompliance with medication regimen: Secondary | ICD-10-CM

## 2015-03-03 DIAGNOSIS — Z7951 Long term (current) use of inhaled steroids: Secondary | ICD-10-CM | POA: Diagnosis not present

## 2015-03-03 DIAGNOSIS — Z79899 Other long term (current) drug therapy: Secondary | ICD-10-CM | POA: Diagnosis not present

## 2015-03-03 DIAGNOSIS — I2510 Atherosclerotic heart disease of native coronary artery without angina pectoris: Secondary | ICD-10-CM | POA: Insufficient documentation

## 2015-03-03 DIAGNOSIS — E119 Type 2 diabetes mellitus without complications: Secondary | ICD-10-CM | POA: Diagnosis not present

## 2015-03-03 LAB — COMPREHENSIVE METABOLIC PANEL
ALK PHOS: 79 U/L (ref 38–126)
ALT: 13 U/L — AB (ref 14–54)
ANION GAP: 9 (ref 5–15)
AST: 18 U/L (ref 15–41)
Albumin: 3.4 g/dL — ABNORMAL LOW (ref 3.5–5.0)
BUN: 13 mg/dL (ref 6–20)
CO2: 28 mmol/L (ref 22–32)
Calcium: 9.6 mg/dL (ref 8.9–10.3)
Chloride: 101 mmol/L (ref 101–111)
Creatinine, Ser: 1.01 mg/dL — ABNORMAL HIGH (ref 0.44–1.00)
GFR calc Af Amer: >60 mL/min (ref >60)
GFR, EST NON AFRICAN AMERICAN: 53 mL/min — AB (ref >60)
Glucose, Bld: 217 mg/dL — ABNORMAL HIGH (ref 65–99)
POTASSIUM: 3.7 mmol/L (ref 3.5–5.1)
Sodium: 138 mmol/L (ref 135–145)
Total Bilirubin: 0.4 mg/dL (ref 0.3–1.2)
Total Protein: 6.7 g/dL (ref 6.5–8.1)

## 2015-03-03 LAB — CBC WITH DIFFERENTIAL/PLATELET
Basophils Absolute: 0.1 10*3/uL (ref 0.0–0.1)
Basophils Relative: 1 % (ref 0–1)
Eosinophils Absolute: 0.4 10*3/uL (ref 0.0–0.7)
Eosinophils Relative: 4 % (ref 0–5)
HCT: 37.5 % (ref 36.0–46.0)
Hemoglobin: 12 g/dL (ref 12.0–15.0)
LYMPHS ABS: 2.8 10*3/uL (ref 0.7–4.0)
LYMPHS PCT: 30 % (ref 12–46)
MCH: 27.7 pg (ref 26.0–34.0)
MCHC: 32 g/dL (ref 30.0–36.0)
MCV: 86.6 fL (ref 78.0–100.0)
Monocytes Absolute: 0.6 10*3/uL (ref 0.1–1.0)
Monocytes Relative: 6 % (ref 3–12)
NEUTROS PCT: 59 % (ref 43–77)
Neutro Abs: 5.4 10*3/uL (ref 1.7–7.7)
PLATELETS: 313 10*3/uL (ref 150–400)
RBC: 4.33 MIL/uL (ref 3.87–5.11)
RDW: 14.6 % (ref 11.5–15.5)
WBC: 9.2 10*3/uL (ref 4.0–10.5)

## 2015-03-03 LAB — I-STAT TROPONIN, ED: Troponin i, poc: 0 ng/mL (ref 0.00–0.08)

## 2015-03-03 LAB — PROTIME-INR
INR: 0.96 (ref 0.00–1.49)
PROTHROMBIN TIME: 13 s (ref 11.6–15.2)

## 2015-03-03 MED ORDER — PANTOPRAZOLE SODIUM 40 MG PO TBEC
40.0000 mg | DELAYED_RELEASE_TABLET | Freq: Every day | ORAL | Status: DC
  Administered 2015-03-04: 40 mg via ORAL
  Filled 2015-03-03: qty 1

## 2015-03-03 MED ORDER — GABAPENTIN 300 MG PO CAPS
300.0000 mg | ORAL_CAPSULE | Freq: Every day | ORAL | Status: DC
Start: 2015-03-03 — End: 2015-03-04
  Administered 2015-03-04: 300 mg via ORAL
  Filled 2015-03-03 (×2): qty 1

## 2015-03-03 MED ORDER — NITROGLYCERIN 0.4 MG SL SUBL
0.4000 mg | SUBLINGUAL_TABLET | SUBLINGUAL | Status: DC | PRN
  Administered 2015-03-03: 0.4 mg via SUBLINGUAL
  Filled 2015-03-03: qty 1

## 2015-03-03 MED ORDER — SODIUM CHLORIDE 0.9 % IV BOLUS (SEPSIS)
1000.0000 mL | Freq: Once | INTRAVENOUS | Status: AC
  Administered 2015-03-03: 1000 mL via INTRAVENOUS

## 2015-03-03 MED ORDER — FENTANYL CITRATE (PF) 100 MCG/2ML IJ SOLN
50.0000 ug | Freq: Once | INTRAMUSCULAR | Status: AC
  Administered 2015-03-03: 50 ug via INTRAVENOUS
  Filled 2015-03-03: qty 2

## 2015-03-03 MED ORDER — SIMVASTATIN 40 MG PO TABS
40.0000 mg | ORAL_TABLET | Freq: Every day | ORAL | Status: DC
Start: 2015-03-04 — End: 2015-03-04
  Administered 2015-03-04: 40 mg via ORAL
  Filled 2015-03-03 (×2): qty 1

## 2015-03-03 MED ORDER — SENNOSIDES-DOCUSATE SODIUM 8.6-50 MG PO TABS
1.0000 | ORAL_TABLET | Freq: Every day | ORAL | Status: DC | PRN
  Filled 2015-03-03: qty 1

## 2015-03-03 MED ORDER — ASPIRIN 81 MG PO CHEW
324.0000 mg | CHEWABLE_TABLET | Freq: Once | ORAL | Status: AC
  Administered 2015-03-03: 324 mg via ORAL
  Filled 2015-03-03: qty 4

## 2015-03-03 MED ORDER — VALSARTAN-HYDROCHLOROTHIAZIDE 160-12.5 MG PO TABS: 1.0000 | ORAL_TABLET | Freq: Every day | ORAL | Status: DC

## 2015-03-03 MED ORDER — GI COCKTAIL ~~LOC~~
30.0000 mL | Freq: Once | ORAL | Status: AC
  Administered 2015-03-03: 30 mL via ORAL
  Filled 2015-03-03: qty 30

## 2015-03-03 MED ORDER — INSULIN ASPART PROT & ASPART (70-30 MIX) 100 UNIT/ML PEN: 17.0000 [IU] | PEN_INJECTOR | Freq: Every day | SUBCUTANEOUS | Status: DC

## 2015-03-03 MED ORDER — MORPHINE SULFATE 2 MG/ML IJ SOLN
2.0000 mg | Freq: Once | INTRAMUSCULAR | Status: AC
  Administered 2015-03-04: 2 mg via INTRAVENOUS
  Filled 2015-03-03: qty 1

## 2015-03-03 MED ORDER — BUDESONIDE-FORMOTEROL FUMARATE 80-4.5 MCG/ACT IN AERO
1.0000 | INHALATION_SPRAY | Freq: Two times a day (BID) | RESPIRATORY_TRACT | Status: DC
Start: 2015-03-03 — End: 2015-03-04

## 2015-03-03 MED ORDER — DOCUSATE SODIUM 100 MG PO CAPS: 100.0000 mg | ORAL_CAPSULE | Freq: Every day | ORAL | Status: DC | PRN

## 2015-03-03 NOTE — ED Notes (Signed)
Pt. reeived the shingles vaccine for the first time today at 1700. 1 hour later at 1800 pt. Started having chest fullness in the center chest with SOB and weakness. No complaints of pain just fullness. CBG 267

## 2015-03-03 NOTE — H&P (Signed)
Date: 03/03/2015               Patient Name:  [name] [name] MRN: [id number]  DOB: 1936-07-22 Age / Sex: 79 y.o., female   PCP: Adrian Prince, MD         Medical Service: Internal Medicine Teaching Service         Attending Physician: Dr. Purvis Sheffield, MD    First Contact: Dr. Loney Loh Pager: [phone]  Second Contact: Dr. Yetta Barre Pager: [phone]       After Hours (After 5p/  First Contact Pager: [phone]  weekends / holidays): Second Contact Pager: [phone]   Chief Complaint: chest pain, shortness of breath  History of Present Illness: Ms. Ferrick is a 79 yo female with DMII, CAD, HTN, HLD, Asthma, and GERD, presenting by EMS with 4 hour history of chest pain and shortness of breath.  She reports that she was watching TV after eating dinner when she started to experience 7/10, dull, nonradiating pain located over the sternum.  The pain then transitioned to a feeling of fullness in her chest and abdomen, like "pressure."  At the same time, she reports that her head "felt funny," like she was "drifting away."  She denies diaphoresis associated with the pain, pressure, or SOB.  The pain has been intermittent, with change in quality or intensity after taking nitroglycerin tablet in the ED.  She denies chest pain during the interview (0/10), but instead feels "fullness."  She has also been SOB recently when lying flat.  However, that shortness of breath is not painful.  She has never experienced chest pain like this before.    She has a history of GERD, for which she takes Prilosec.  However, she endorses sporadic use of the medication.  She endorses feeling bloated easily, but reports this pain is different. She denies worsening or relief of pain with positional changes, including leaning forward.  She denies worsening pain with deep inhalation.  She has no history of DVT, PE, or clotting disorders.  However, she reports one episode of sharp left calf pain that subsided over 3-4 hours.  She had  no SOB at that time. She denies recent travel or immobilization.  She denies history of cancer.  She endorses mild nausea, but denies fever, chills, palpitations, vomiting, diarrhea, or trouble swallowing.  She also reports that she received her Shingles vaccine at approximately 5pm today, with her chest pain symptoms starting around 6-6:30 pm.  She has never had a reaction to vaccines in the past.  In the ED, she was satting 100% on 2L. Her first troponin was negative.  She had a cardiac cath in 1999 which showed nonocclusive disease of OM1.  She had a nuclear stress test in 2015 that was negative for ischemic disease.  She had a cardiac consultation for chest pain during an inpatient admission in 2003 that diagnosed likely coronary spasm.  Meds: Current Facility-Administered Medications  Medication Dose Route Frequency Provider Last Rate Last Dose  . morphine 2 MG/ML injection 2 mg  2 mg Intravenous Once Jana Half, MD      . nitroGLYCERIN (NITROSTAT) SL tablet 0.4 mg  0.4 mg Sublingual Q5 min PRN Lenell Antu, MD   0.4 mg at 03/03/15 2047   Current Outpatient Prescriptions  Medication Sig Dispense Refill  . albuterol (PROVENTIL HFA) 108 (90 BASE) MCG/ACT inhaler Inhale 2 puffs into the lungs every 6 (six) hours as needed for shortness of breath. 6 Inhaler 0  .  clonazePAM (KLONOPIN) 1 MG tablet Take 1 mg by mouth 2 (two) times daily as needed for anxiety.     . docusate sodium (STOOL SOFTENER) 100 MG capsule Take 1 capsule (100 mg total) by mouth daily as needed for mild constipation or moderate constipation. 30 capsule 3  . gabapentin (NEURONTIN) 300 MG capsule Take 300 mg by mouth at bedtime.  5  . metFORMIN (GLUCOPHAGE) 500 MG tablet Take 500 mg by mouth 2 (two) times daily with a meal.     . naproxen (NAPROSYN) 500 MG tablet TAKE 1 TABLET (500 MG TOTAL) BY MOUTH 2 (TWO) TIMES DAILY WITH A MEAL. TAKE WITH FOOD. 60 tablet 0  . NOVOLOG MIX 70/30 FLEXPEN (70-30) 100 UNIT/ML SUPN  Inject 17 Units into the skin daily with supper.     Marland Kitchen omeprazole (PRILOSEC) 20 MG capsule Take 1 capsule by mouth 2 (two) times daily.    Marland Kitchen senna-docusate (SENOKOT-S) 8.6-50 MG per tablet Take 1 tablet by mouth daily as needed for mild constipation or moderate constipation. 30 tablet 3  . simvastatin (ZOCOR) 40 MG tablet Take 40 mg by mouth at bedtime.    . SYMBICORT 80-4.5 MCG/ACT inhaler Inhale 1 puff into the lungs 2 (two) times daily.  3  . valsartan-hydrochlorothiazide (DIOVAN-HCT) 160-12.5 MG per tablet Take 1 tablet by mouth daily.    Marland Kitchen LYRICA 75 MG capsule       Allergies: Allergies as of 03/03/2015 - Review Complete 03/03/2015  Allergen Reaction Noted  . Contrast media [iodinated diagnostic agents] Shortness Of Breath and Rash 03/26/2012  . Sulfa antibiotics Swelling 09/08/2012  . Aspirin Other (See Comments)   . Oxycodone-acetaminophen Rash    Past Medical History  Diagnosis Date  . Asthma   . GERD (gastroesophageal reflux disease)   . Hypertension   . CAD (coronary artery disease)   . Obesity   . Diabetes mellitus   . Cervical spondylosis 03/26/2012  . Benign paroxysmal positional vertigo 11/05/2012  . CHF (congestive heart failure)   . Shortness of breath   . Anxiety    Past Surgical History  Procedure Laterality Date  . Cholecystectomy    . Appendectomy    . Abdominal hysterectomy    . Rotator cuff repair  2003    Right  . Lumbar laminectomy  2002  . Cesarean section    . Ear pins bilaterally     Family History  Problem Relation Age of Onset  . Cancer Mother     Question pancreatic  . Coronary artery disease Father   . Breast cancer Sister 26  . Coronary artery disease Sister 44  . Colon cancer Neg Hx    History   Social History  . Marital Status: Married    Spouse Name: N/A  . Number of Children: N/A  . Years of Education: N/A   Occupational History  . Not on file.   Social History Main Topics  . Smoking status: Never Smoker   . Smokeless  tobacco: Never Used  . Alcohol Use: No  . Drug Use: No  . Sexual Activity: Not on file   Other Topics Concern  . Not on file   Social History Narrative   Patient is retired   2 children    Review of Systems: Pertinent items are noted in HPI.  Physical Exam: Blood pressure 173/75, pulse 76, resp. rate 19, SpO2 96 %. Physical Exam  Constitutional: She is oriented to person, place, and time and well-developed, well-nourished, and  in no distress. No distress.  HENT:  Head: Normocephalic and atraumatic.  Eyes: EOM are normal.  Neck: Normal range of motion. No JVD present. No tracheal deviation present.  Cardiovascular: Normal rate, regular rhythm, normal heart sounds and intact distal pulses.   Pulmonary/Chest: Effort normal. No respiratory distress.  Breath sounds poorly auscultated due to poor inspiration.  No crackles or wheezes appreciated.  Abdominal: Soft. Bowel sounds are normal. She exhibits no distension. There is no tenderness. There is no rebound and no guarding.  Musculoskeletal:  Trace peripheral edema to knee bilaterally. No calf pain or cords appreciated.  Neurological: She is alert and oriented to person, place, and time.  Skin: Skin is warm and dry. She is not diaphoretic.  No rash     Lab results: Basic Metabolic Panel:  Recent Labs  16/10/96 2054  NA 138  K 3.7  CL 101  CO2 28  GLUCOSE 217*  BUN 13  CREATININE 1.01*  CALCIUM 9.6   Liver Function Tests:  Recent Labs  03/03/15 2054  AST 18  ALT 13*  ALKPHOS 79  BILITOT 0.4  PROT 6.7  ALBUMIN 3.4*   No results for input(s): LIPASE, AMYLASE in the last 72 hours. No results for input(s): AMMONIA in the last 72 hours. CBC:  Recent Labs  03/03/15 2054  WBC 9.2  NEUTROABS 5.4  HGB 12.0  HCT 37.5  MCV 86.6  PLT 313   Cardiac Enzymes: No results for input(s): CKTOTAL, CKMB, CKMBINDEX, TROPONINI in the last 72 hours. BNP: No results for input(s): PROBNP in the last 72  hours. D-Dimer: No results for input(s): DDIMER in the last 72 hours. CBG: No results for input(s): GLUCAP in the last 72 hours. Hemoglobin A1C: No results for input(s): HGBA1C in the last 72 hours. Fasting Lipid Panel: No results for input(s): CHOL, HDL, LDLCALC, TRIG, CHOLHDL, LDLDIRECT in the last 72 hours. Thyroid Function Tests: No results for input(s): TSH, T4TOTAL, FREET4, T3FREE, THYROIDAB in the last 72 hours. Anemia Panel: No results for input(s): VITAMINB12, FOLATE, FERRITIN, TIBC, IRON, RETICCTPCT in the last 72 hours. Coagulation:  Recent Labs  03/03/15 2054  LABPROT 13.0  INR 0.96   Urine Drug Screen: Drugs of Abuse  No results found for: LABOPIA, COCAINSCRNUR, LABBENZ, AMPHETMU, THCU, LABBARB  Alcohol Level: No results for input(s): ETH in the last 72 hours. Urinalysis: No results for input(s): COLORURINE, LABSPEC, PHURINE, GLUCOSEU, HGBUR, BILIRUBINUR, KETONESUR, PROTEINUR, UROBILINOGEN, NITRITE, LEUKOCYTESUR in the last 72 hours.  Invalid input(s): APPERANCEUR   Imaging results:  Dg Chest 2 View  03/03/2015   CLINICAL DATA:  79 year old female with chest pain.  EXAM: CHEST  2 VIEW  COMPARISON:  Chest radiograph dated 09/21/2013  FINDINGS: Two views of the chest demonstrate clear lungs. No pleural effusion, consolidation, or pneumothorax. Moderate cardiomegaly. The osseous structures are grossly unremarkable.  IMPRESSION: No active cardiopulmonary disease.   Electronically Signed   By: Elgie Collard M.D.   On: 03/03/2015 21:19    Other results: EKG: normal sinus rhythm, prolonged QTc ( ), LAD.  Assessment & Plan by Problem: Active Problems:   Type 2 diabetes mellitus, controlled   Hyperlipidemia   Essential hypertension   Asthma   GERD  Ms. Bobeck is a 79 yo female with DMII, CAD, HTN, HLD, Asthma, and GERD, presenting by EMS with 4 hour history of chest pain and shortness of breath.  Atypical Chest Pain: Patient has dull, nonradiating, chest  pain that evolved to fullness and pressure.  The  pain came on at rest, after eating, and did not change with nitroglycerin.  She has a history of GERD and is easily bloated, for which she is medication nonadherent.  However, the differential also includes NSTEMI, DVT/PE, Prinzmetals, new onset CHF, and anxiety.  Her Wells for DVT and PE are both 0.  She was not tachycardic or tachypnic and clinical suspicion is low.  Will defer PVLs and d-dimer at this time. She had an echo in 2004 that was limited and unable to provide an ejection fraction.  However, given her inability to lie flat and trace peripheral edema, new onset CHF is possible.  She was not distressed upon evaluation, so anxiety attack is not suspected.  Due to h/o CAD, patient could benefit from inpatient or outpatient stress test. Consider cardiology consultation for recommendations. - BNP and echo - Trend troponins - Repeat ECG in AM for prolonged QTc - Telemetry - Nitro PRN - Oxygen PRN - s/p ASA 325. Patient has documented intolerance to ASA.  Asthma: Patient reports good control of her asthma at baseline. Will hold beta agonists at this time for chest pain rule out. - Hold Albuterol - Hold Symbicort  DMII: On metformin and Novolog 70/30 17 units at dinner. - Hold metformin - SSI  GERD: Noncompliant with medications.  Consider further discussion regarding compliance. - Protonix 40 mg PO  HTN: elevated in the ED, likely 2/2 pain.  Continue home meds - Valsartan-HCTZ 160/12.5 mg daily  HLD: - Simvastatin 40 mg daily. Consider switch to high intensity therapy  Back Pain: - Continue Gabapentin 300 mg qHS  Anxiety: - Continue Clonazepam 1 mg BID PRN anxiety  FEN/GI: - Senna and Colace - NPO  DVT Ppx: Heparin  Dispo: Disposition is deferred at this time, awaiting improvement of current medical problems.   The patient does have a current PCP Adrian Prince(Stephen South, MD) and [facility] need [facility] after  discharge.  The patient does not have transportation limitations that hinder transportation to clinic appointments.  Signed: Jana HalfNicholas A Bryttney Netzer, MD, PhD 03/03/2015, 11:25 PM

## 2015-03-03 NOTE — ED Provider Notes (Signed)
CSN: [id number]     Arrival date & time 03/03/15  2001 History   First MD Initiated Contact with Patient 03/03/15 2002     Chief Complaint  Patient presents with  . Chest Pain    (Consider location/radiation/quality/duration/timing/severity/associated sxs/prior Treatment) Patient is a 79 y.o. female presenting with chest pain. The history is provided by the patient.  Chest Pain Pain location:  Substernal area Pain quality: dull   Pain radiates to:  Does not radiate Pain radiates to the back: no   Pain severity:  Mild Onset quality:  Sudden Timing:  Constant Progression:  Unchanged Chronicity:  New Context: at rest   Context comment:  Soon after eating Relieved by:  Nothing Worsened by:  Nothing tried Ineffective treatments:  None tried Associated symptoms: nausea and shortness of breath   Associated symptoms: no abdominal pain, no altered mental status, no claudication, no cough, no fatigue, no headache, no palpitations, not vomiting and no weakness   Risk factors: diabetes mellitus and hypertension   Risk factors: no coronary artery disease     Past Medical History  Diagnosis Date  . Asthma   . GERD (gastroesophageal reflux disease)   . Hypertension   . CAD (coronary artery disease)   . Obesity   . Diabetes mellitus   . Cervical spondylosis 03/26/2012  . Benign paroxysmal positional vertigo 11/05/2012  . CHF (congestive heart failure)   . Shortness of breath   . Anxiety    Past Surgical History  Procedure Laterality Date  . Cholecystectomy    . Appendectomy    . Abdominal hysterectomy    . Rotator cuff repair  2003    Right  . Lumbar laminectomy  2002  . Cesarean section    . Ear pins bilaterally     Family History  Problem Relation Age of Onset  . Cancer Mother     Question pancreatic  . Coronary artery disease Father   . Breast cancer Sister [id number]  . Coronary artery disease Sister [id number]  . Colon cancer Neg Hx    History  Substance Use Topics  . Smoking  status: Never Smoker   . Smokeless tobacco: Never Used  . Alcohol Use: No   OB History    No data available     Review of Systems  Constitutional: Negative for fatigue.  Respiratory: Positive for shortness of breath. Negative for cough and chest tightness.   Cardiovascular: Positive for chest pain. Negative for palpitations and claudication.  Gastrointestinal: Positive for nausea. Negative for vomiting and abdominal pain.  Neurological: Negative for weakness, light-headedness and headaches.  Psychiatric/Behavioral: Negative for confusion.  All other systems reviewed and are negative.     Allergies  Contrast media; Sulfa antibiotics; Aspirin; and Oxycodone-acetaminophen  Home Medications   Prior to Admission medications   Medication Sig Start Date End Date Taking? Authorizing Provider  albuterol (PROVENTIL HFA) 108 (90 BASE) MCG/ACT inhaler Inhale 2 puffs into the lungs every 6 (six) hours as needed for shortness of breath. 01/12/15  Yes Doneen PoissonLawrence Klima, MD  clonazePAM (KLONOPIN) 1 MG tablet Take 1 mg by mouth 2 (two) times daily as needed for anxiety.    Yes Historical Provider, MD  docusate sodium (STOOL SOFTENER) 100 MG capsule Take 1 capsule (100 mg total) by mouth daily as needed for mild constipation or moderate constipation. 08/04/14  Yes Margarito LinerJerry Joines, MD  gabapentin (NEURONTIN) 300 MG capsule Take 300 mg by mouth at bedtime. 02/09/15  Yes Historical Provider, MD  metFORMIN (  GLUCOPHAGE) 500 MG tablet Take 500 mg by mouth 2 (two) times daily with a meal.    Yes Historical Provider, MD  naproxen (NAPROSYN) 500 MG tablet TAKE 1 TABLET (500 MG TOTAL) BY MOUTH 2 (TWO) TIMES DAILY WITH A MEAL. TAKE WITH FOOD. 01/10/15  Yes Inez Catalina, MD  NOVOLOG MIX 70/30 FLEXPEN (70-30) 100 UNIT/ML SUPN Inject 17 Units into the skin daily with supper.  02/20/13  Yes Historical Provider, MD  omeprazole (PRILOSEC) 20 MG capsule Take 1 capsule by mouth 2 (two) times daily. 07/10/13  Yes Historical  Provider, MD  senna-docusate (SENOKOT-S) 8.6-50 MG per tablet Take 1 tablet by mouth daily as needed for mild constipation or moderate constipation. 08/04/14  Yes Margarito Liner, MD  simvastatin (ZOCOR) 40 MG tablet Take 40 mg by mouth at bedtime.   Yes Historical Provider, MD  SYMBICORT 80-4.5 MCG/ACT inhaler Inhale 1 puff into the lungs 2 (two) times daily. 10/12/14  Yes Historical Provider, MD  valsartan-hydrochlorothiazide (DIOVAN-HCT) 160-12.5 MG per tablet Take 1 tablet by mouth daily.   Yes Historical Provider, MD  LYRICA 75 MG capsule  08/26/14   Historical Provider, MD   BP 155/60 mmHg  Pulse 74  Resp 21  SpO2 100% Physical Exam  Constitutional: She appears well-developed and well-nourished. No distress.  HENT:  Head: Normocephalic and atraumatic.  Nose: Nose normal.  Mouth/Throat: Oropharynx is clear and moist. No oropharyngeal exudate.  Eyes: EOM are normal. Pupils are equal, round, and reactive to light.  Neck: Normal range of motion. Neck supple.  Cardiovascular: Normal rate, regular rhythm, normal heart sounds and intact distal pulses.   No murmur heard. Pulmonary/Chest: Effort normal and breath sounds normal. No respiratory distress. She has no wheezes. She exhibits no tenderness.  No reproducible chest tenderness to palpation.  Clear lungs, normal work of breathing.  On 2L O2 by [place]  Abdominal: Soft. There is no tenderness. There is no rebound and no guarding.  Musculoskeletal: Normal range of motion. She exhibits no tenderness.  Lymphadenopathy:    She has no cervical adenopathy.  Neurological: She is alert. No cranial nerve deficit. Coordination normal.  Skin: Skin is warm and dry. She is not diaphoretic.  Psychiatric: She has a normal mood and affect. Her behavior is normal. Judgment and thought content normal.  Nursing note and vitals reviewed.   ED Course  Procedures (including critical care time) Labs Review Labs Reviewed  COMPREHENSIVE METABOLIC PANEL -  Abnormal; Notable for the following:    Glucose, Bld 217 (*)    Creatinine, Ser 1.01 (*)    Albumin 3.4 (*)    ALT 13 (*)    GFR calc non Af Amer 53 (*)    All other components within normal limits  PROTIME-INR  CBC WITH DIFFERENTIAL/PLATELET  Rosezena Sensor, ED    Imaging Review Dg Chest 2 View  03/03/2015   CLINICAL DATA:  79 year old female with chest pain.  EXAM: CHEST  2 VIEW  COMPARISON:  Chest radiograph dated 09/21/2013  FINDINGS: Two views of the chest demonstrate clear lungs. No pleural effusion, consolidation, or pneumothorax. Moderate cardiomegaly. The osseous structures are grossly unremarkable.  IMPRESSION: No active cardiopulmonary disease.   Electronically Signed   By: Elgie Collard M.D.   On: 03/03/2015 21:19     EKG Interpretation   Date/Time:  Thursday March 03 2015 20:07:16 EDT Ventricular Rate:  88 PR Interval:  219 QRS Duration: 76 QT Interval:  468 QTC Calculation: 566 R Axis:   -  6 Text Interpretation:  Sinus rhythm Borderline prolonged PR interval  Abnormal R-wave progression, early transition Borderline abnrm T,  anterolateral leads Prolonged QT interval Baseline wander in lead(s) I II  aVR aVF V2 Confirmed by HARRISON  MD, FORREST ([id number]) on 03/03/2015 8:19:08  PM      MDM   Final diagnoses:  Substernal chest pain   Pt is a 79 yo F with hx of CHF, COPD (O2 at home PRN), DM, HTN, and asthma who presented with chest pain. Complains of substernal chest pressure tonight after dinner.  Associated nausea, SOB, diaphoresis, and anxiety.  Got a shingles shot in her left arm this afternoon and thought she might be having an abnormal reaction so presented to the ED.  Has CAD on her Epic history but reports she has never had a heart attack, never seen a cardiologist, and never had a cath or stress test.   Looks uncomfortable but not toxic.  No reproducible chest tenderness to palpation.  Lungs clear.  On 2L O2 by [place] for complaint of dyspnea but O2 sats in  upper 90s on room air.   Given ASA 324 and will send labs to rule out ACS.  No improvement with NTG.  Given GI cocktail and fentanyl and pain resolved.    EKG with no ST elevations/depressions, no acute T wave changes.  QTc prolonged at 566.   First trop < 0.03.  CXR clear.  Mildly dehydrated on labs, so given 1L NS bolus.    To admit to resident medicine team for ACS rule out in this patient with high risk HEART score.  Patient agreeable to plan.  Stable for telemetry unit.   Labs, EKGs, and imaging were reviewed and interpreted by myself and my attending, and incorporated in the medical decision making.  Patient was seen with ED Attending, Dr. Vicente Masson, MD   Lenell Antu, MD 03/05/15 1314  Purvis Sheffield, MD 03/05/15 [phone]

## 2015-03-04 DIAGNOSIS — K219 Gastro-esophageal reflux disease without esophagitis: Secondary | ICD-10-CM | POA: Diagnosis not present

## 2015-03-04 DIAGNOSIS — I10 Essential (primary) hypertension: Secondary | ICD-10-CM | POA: Diagnosis not present

## 2015-03-04 DIAGNOSIS — E119 Type 2 diabetes mellitus without complications: Secondary | ICD-10-CM | POA: Diagnosis not present

## 2015-03-04 DIAGNOSIS — R0789 Other chest pain: Secondary | ICD-10-CM

## 2015-03-04 LAB — GLUCOSE, CAPILLARY
GLUCOSE-CAPILLARY: 126 mg/dL — AB (ref 65–99)
GLUCOSE-CAPILLARY: 113 mg/dL — AB (ref 65–99)
Glucose-Capillary: 128 mg/dL — ABNORMAL HIGH (ref 65–99)
Glucose-Capillary: 97 mg/dL (ref 65–99)

## 2015-03-04 LAB — BASIC METABOLIC PANEL
ANION GAP: 7 (ref 5–15)
ANION GAP: 6 (ref 5–15)
BUN: 12 mg/dL (ref 6–20)
BUN: 12 mg/dL (ref 6–20)
CHLORIDE: 102 mmol/L (ref 101–111)
CO2: 30 mmol/L (ref 22–32)
CO2: 30 mmol/L (ref 22–32)
Calcium: 9.3 mg/dL (ref 8.9–10.3)
Calcium: 9 mg/dL (ref 8.9–10.3)
Chloride: 104 mmol/L (ref 101–111)
Creatinine, Ser: 0.86 mg/dL (ref 0.44–1.00)
Creatinine, Ser: 0.85 mg/dL (ref 0.44–1.00)
GFR calc Af Amer: >60 mL/min (ref >60)
GFR calc non Af Amer: >60 mL/min (ref >60)
Glucose, Bld: 130 mg/dL — ABNORMAL HIGH (ref 65–99)
Glucose, Bld: 139 mg/dL — ABNORMAL HIGH (ref 65–99)
POTASSIUM: 3.7 mmol/L (ref 3.5–5.1)
Potassium: 3.6 mmol/L (ref 3.5–5.1)
SODIUM: 141 mmol/L (ref 135–145)
Sodium: 138 mmol/L (ref 135–145)

## 2015-03-04 LAB — TROPONIN I
Troponin I: <0.03 ng/mL (ref <0.031)
Troponin I: <0.03 ng/mL (ref <0.031)

## 2015-03-04 LAB — BRAIN NATRIURETIC PEPTIDE: B Natriuretic Peptide: 12.3 pg/mL (ref 0.0–100.0)

## 2015-03-04 MED ORDER — CLONAZEPAM 1 MG PO TABS: 1.0000 mg | ORAL_TABLET | Freq: Two times a day (BID) | ORAL | Status: DC | PRN

## 2015-03-04 MED ORDER — HEPARIN SODIUM (PORCINE) 5000 UNIT/ML IJ SOLN
5000.0000 [IU] | Freq: Three times a day (TID) | INTRAMUSCULAR | Status: DC
  Filled 2015-03-04 (×3): qty 1

## 2015-03-04 MED ORDER — IRBESARTAN 150 MG PO TABS
150.0000 mg | ORAL_TABLET | Freq: Every day | ORAL | Status: DC
  Administered 2015-03-04: 150 mg via ORAL
  Filled 2015-03-04: qty 1

## 2015-03-04 MED ORDER — ALBUTEROL SULFATE (2.5 MG/3ML) 0.083% IN NEBU
2.5000 mg | INHALATION_SOLUTION | Freq: Four times a day (QID) | RESPIRATORY_TRACT | Status: DC | PRN
  Administered 2015-03-04: 2.5 mg via RESPIRATORY_TRACT
  Filled 2015-03-04 (×2): qty 3

## 2015-03-04 MED ORDER — INSULIN ASPART 100 UNIT/ML ~~LOC~~ SOLN
0.0000 [IU] | SUBCUTANEOUS | Status: DC
  Administered 2015-03-04: 2 [IU] via SUBCUTANEOUS

## 2015-03-04 MED ORDER — HYDROCHLOROTHIAZIDE 12.5 MG PO CAPS
12.5000 mg | ORAL_CAPSULE | Freq: Every day | ORAL | Status: DC
  Administered 2015-03-04: 12.5 mg via ORAL
  Filled 2015-03-04: qty 1

## 2015-03-04 NOTE — Discharge Summary (Signed)
Name: [name] [name] MRN: [id number] DOB: [birth year] 79 y.o. PCP: Adrian Prince, MD  Date of Admission: 03/03/2015  8:01 PM Date of Discharge: 03/04/2015 Attending Physician: Earl Lagos, MD  Discharge Diagnosis: Principal Problem:   Atypical chest pain Active Problems:   Type 2 diabetes mellitus, controlled   Hyperlipidemia   Essential hypertension   Asthma   GERD  Discharge Medications:   Medication List    ASK your doctor about these medications        albuterol 108 (90 BASE) MCG/ACT inhaler  Commonly known as:  PROVENTIL HFA  Inhale 2 puffs into the lungs every 6 (six) hours as needed for shortness of breath.     clonazePAM 1 MG tablet  Commonly known as:  KLONOPIN  Take 1 mg by mouth 2 (two) times daily as needed for anxiety.     docusate sodium 100 MG capsule  Commonly known as:  STOOL SOFTENER  Take 1 capsule (100 mg total) by mouth daily as needed for mild constipation or moderate constipation.     gabapentin 300 MG capsule  Commonly known as:  NEURONTIN  Take 300 mg by mouth at bedtime.     LYRICA 75 MG capsule  Generic drug:  pregabalin     metFORMIN 500 MG tablet  Commonly known as:  GLUCOPHAGE  Take 500 mg by mouth 2 (two) times daily with a meal.     naproxen 500 MG tablet  Commonly known as:  NAPROSYN  TAKE 1 TABLET (500 MG TOTAL) BY MOUTH 2 (TWO) TIMES DAILY WITH A MEAL. TAKE WITH FOOD.     NOVOLOG MIX 70/30 FLEXPEN (70-30) 100 UNIT/ML FlexPen  Generic drug:  insulin aspart protamine - aspart  Inject 17 Units into the skin daily with supper.     omeprazole 20 MG capsule  Commonly known as:  PRILOSEC  Take 1 capsule by mouth 2 (two) times daily.     senna-docusate 8.6-50 MG per tablet  Commonly known as:  Senokot-S  Take 1 tablet by mouth daily as needed for mild constipation or moderate constipation.     simvastatin 40 MG tablet  Commonly known as:  ZOCOR  Take 40 mg by mouth at bedtime.     SYMBICORT 80-4.5 MCG/ACT inhaler    Generic drug:  budesonide-formoterol  Inhale 1 puff into the lungs 2 (two) times daily.     valsartan-hydrochlorothiazide 160-12.5 MG per tablet  Commonly known as:  DIOVAN-HCT  Take 1 tablet by mouth daily.        Disposition and follow-up:   [name] [name] was discharged from [facility] in Stable condition.  At the hospital follow up visit please address:  1.  GERD Is the patient taking Omeprazole regularly? Is she still having substernal pain or fullness?   2.  Labs / imaging needed at time of follow-up: Stress test, EGD  3.  Pending labs/ test needing follow-up: None  Follow-up Appointments: [facility]   Discharge Instructions:   Consultations:  None  Procedures Performed:  Dg Chest 2 View  03/03/2015   CLINICAL DATA:  79 year old female with chest pain.  EXAM: CHEST  2 VIEW  COMPARISON:  Chest radiograph dated 09/21/2013  FINDINGS: Two views of the chest demonstrate clear lungs. No pleural effusion, consolidation, or pneumothorax. Moderate cardiomegaly. The osseous structures are grossly unremarkable.  IMPRESSION: No active cardiopulmonary disease.   Electronically Signed   By: Elgie Collard M.D.   On: 03/03/2015 21:19   Admission  HPI: [name] [name] is a 79 yo female with DMII, CAD, HTN, HLD, Asthma, and GERD, presenting by EMS with 4 hour history of chest pain and shortness of breath. She reports that she was watching TV after eating dinner when she started to experience 7/10, dull, nonradiating pain located over the sternum. The pain then transitioned to a feeling of fullness in her chest and abdomen, like "pressure." At the same time, she reports that her head "felt funny," like she was "drifting away." She denies diaphoresis associated with the pain, pressure, or SOB. The pain has been intermittent, with change in quality or intensity after taking nitroglycerin tablet in the ED. She denies chest pain during the interview (0/10), but instead feels  "fullness." She has also been SOB recently when lying flat. However, that shortness of breath is not painful. She has never experienced chest pain like this before.   She has a history of GERD, for which she takes Prilosec. However, she endorses sporadic use of the medication. She endorses feeling bloated easily, but reports this pain is different. She denies worsening or relief of pain with positional changes, including leaning forward. She denies worsening pain with deep inhalation. She has no history of DVT, PE, or clotting disorders. However, she reports one episode of sharp left calf pain that subsided over 3-4 hours. She had no SOB at that time. She denies recent travel or immobilization. She denies history of cancer.  She endorses mild nausea, but denies fever, chills, palpitations, vomiting, diarrhea, or trouble swallowing.  She also reports that she received her Shingles vaccine at approximately 5pm today, with her chest pain symptoms starting around 6-6:30 pm. She has never had a reaction to vaccines in the past.  In the ED, she was satting 100% on 2L. Her first troponin was negative.  She had a cardiac cath in 1999 which showed nonocclusive disease of OM1. She had a nuclear stress test in 2015 that was negative for ischemic disease. She had a cardiac consultation for chest pain during an inpatient admission in 2003 that diagnosed likely coronary spasm.  Hospital Course by problem list: Principal Problem:   Atypical chest pain Active Problems:   Type 2 diabetes mellitus, controlled   Hyperlipidemia   Essential hypertension   Asthma   GERD   Atypical Chest Pain: Most likely 2/2 GERD. Patient had mild nausea and a dull, nonradiating, substrernal pain that evolved to fullness and pressure. The pain came on at rest and after eating. Pt was not tachycardic or tachypnic. She complained of SOB, however, was satting 100% on 2L O2. Tropinins were negative. EKG did not show any  acute ST/ T wave changes. BNP was normal. She has a history of GERD and is easily bloated. Pt admits to not taking her PPI regularly. She was given ASA and nitroglycerin in the ED and was admitted to telemetry. She was given Protonix for GERD. Her pain resolved completely. Repeat troponins negative and repeat EKG did not show any changes. Pts pain completely resolved and she was stable on the day of discharge. She was advised to follow up with [facility]. Pt will benefit from an outpatient stress test.   Asthma: Patient reports good control of her asthma at baseline. Held beta agonists at this time for chest pain rule out. She was given O2 via [place] PRN. Her home meds Albuterol and Symbicort were continued.   DMII: On metformin and Novolog 70/30 17 units at dinner. Held metformin and continued SSI.  GERD: Noncompliant with medications. Pt started on Protonix  PO. I discussed the importance of medication compliance with the patient. She will benefit from an outpatient EGD. Discharged with Terisa Starr  po BID.   HTN: elevated in the ED, likely 2/2 pain. Continued home meds Avapro and HCTZ.   HLD: continued home med Simvastatin 40 mg daily.   Back Pain: Continued home med Gabapentin 300 mg qHS.  Anxiety: Continued Clonazepam 1 mg BID PRN anxiety.  FEN/GI: - Senna and Colace - NPO  DVT Ppx: Heparin  Discharge Vitals:   BP 163/78 mmHg  Pulse 65  Temp(Src) 97.9 F (36.6 C) (Oral)  Resp 18  Ht  (1.702 m)  Wt 205 lb 0.4 oz (93 kg)  BMI 32.10 kg/m2  SpO2 99%  Discharge Labs:  Results for orders placed or performed during the hospital encounter of 03/03/15 (from the past 24 hour(s))  Comprehensive metabolic panel     Status: Abnormal   Collection Time: 03/03/15  8:54 PM  Result Value Ref Range   Sodium 138 135 - 145 mmol/L   Potassium 3.7 3.5 - 5.1 mmol/L   Chloride 101 101 - 111 mmol/L   CO2 28 22 - 32 mmol/L   Glucose, Bld 217 (H) 65 - 99 mg/dL   BUN 13 6 - 20 mg/dL    Creatinine, Ser 1.61 (H) 0.44 - 1.00 mg/dL   Calcium 9.6 8.9 - 09.6 mg/dL   Total Protein 6.7 6.5 - 8.1 g/dL   Albumin 3.4 (L) 3.5 - 5.0 g/dL   AST 18 15 - 41 U/L   ALT 13 (L) 14 - 54 U/L   Alkaline Phosphatase 79 38 - 126 U/L   Total Bilirubin 0.4 0.3 - 1.2 mg/dL   GFR calc non Af Amer 53 (L) >60 mL/min   GFR calc Af Amer >60 >60 mL/min   Anion gap 9 5 - 15  Protime-INR     Status: None   Collection Time: 03/03/15  8:54 PM  Result Value Ref Range   Prothrombin Time 13.0 11.6 - 15.2 seconds   INR 0.96 0.00 - 1.49  CBC with Differential     Status: None   Collection Time: 03/03/15  8:54 PM  Result Value Ref Range   WBC 9.2 4.0 - 10.5 K/uL   RBC 4.33 3.87 - 5.11 MIL/uL   Hemoglobin 12.0 12.0 - 15.0 g/dL   HCT 04.5 40.9 - 81.1 %   MCV 86.6 78.0 - 100.0 fL   MCH 27.7 26.0 - 34.0 pg   MCHC 32.0 30.0 - 36.0 g/dL   RDW 91.4 78.2 - 95.6 %   Platelets 313 150 - 400 K/uL   Neutrophils Relative % 59 43 - 77 %   Neutro Abs 5.4 1.7 - 7.7 K/uL   Lymphocytes Relative 30 12 - 46 %   Lymphs Abs 2.8 0.7 - 4.0 K/uL   Monocytes Relative 6 3 - 12 %   Monocytes Absolute 0.6 0.1 - 1.0 K/uL   Eosinophils Relative 4 0 - 5 %   Eosinophils Absolute 0.4 0.0 - 0.7 K/uL   Basophils Relative 1 0 - 1 %   Basophils Absolute 0.1 0.0 - 0.1 K/uL  I-stat troponin, ED     Status: None   Collection Time: 03/03/15  8:58 PM  Result Value Ref Range   Troponin i, poc 0.00 0.00 - 0.08 ng/mL   Comment 3          Glucose, capillary  Status: Abnormal   Collection Time: 03/04/15 12:56 AM  Result Value Ref Range   Glucose-Capillary 128 (H) 65 - 99 mg/dL  Brain natriuretic peptide     Status: None   Collection Time: 03/04/15  1:09 AM  Result Value Ref Range   B Natriuretic Peptide 12.3 0.0 - 100.0 pg/mL  Basic metabolic panel     Status: Abnormal   Collection Time: 03/04/15  1:09 AM  Result Value Ref Range   Sodium 141 135 - 145 mmol/L   Potassium 3.7 3.5 - 5.1 mmol/L   Chloride 104 101 - 111 mmol/L   CO2  30 22 - 32 mmol/L   Glucose, Bld 130 (H) 65 - 99 mg/dL   BUN 12 6 - 20 mg/dL   Creatinine, Ser 1.610.86 0.44 - 1.00 mg/dL   Calcium 9.3 8.9 - 09.610.3 mg/dL   GFR calc non Af Amer >60 >60 mL/min   GFR calc Af Amer >60 >60 mL/min   Anion gap 7 5 - 15  Troponin I     Status: None   Collection Time: 03/04/15  3:12 AM  Result Value Ref Range   Troponin I <0.03 <0.031 ng/mL  Basic metabolic panel     Status: Abnormal   Collection Time: 03/04/15  3:12 AM  Result Value Ref Range   Sodium 138 135 - 145 mmol/L   Potassium 3.6 3.5 - 5.1 mmol/L   Chloride 102 101 - 111 mmol/L   CO2 30 22 - 32 mmol/L   Glucose, Bld 139 (H) 65 - 99 mg/dL   BUN 12 6 - 20 mg/dL   Creatinine, Ser 0.450.85 0.44 - 1.00 mg/dL   Calcium 9.0 8.9 - 40.910.3 mg/dL   GFR calc non Af Amer >60 >60 mL/min   GFR calc Af Amer >60 >60 mL/min   Anion gap 6 5 - 15  Glucose, capillary     Status: Abnormal   Collection Time: 03/04/15  4:20 AM  Result Value Ref Range   Glucose-Capillary 126 (H) 65 - 99 mg/dL  Glucose, capillary     Status: None   Collection Time: 03/04/15  7:56 AM  Result Value Ref Range   Glucose-Capillary 97 65 - 99 mg/dL   Comment 1 Notify RN   Troponin I     Status: None   Collection Time: 03/04/15  9:05 AM  Result Value Ref Range   Troponin I <0.03 <0.031 ng/mL    Signed: John GiovanniVasundhra Fleur Audino, MD 03/04/2015, 2:29 PM    Services Ordered on Discharge: None Equipment Ordered on Discharge: None

## 2015-03-04 NOTE — Discharge Instructions (Signed)
1. You have a follow up appointment as follows:  [name] [name], [name] [name]  Schedule an appointment as soon as possible for a visit  Follow up with Dr. Evlyn KannerSouth within 2 weeks.  [address] [place] [id number] [phone]  2. Please take all medications as previously prescribed.  3. If you have worsening of your symptoms or new symptoms arise, please call the clinic [phone]), or go to the ER immediately if symptoms are severe.  You have done a great job in taking all your medications. Please continue to do this.   Gastroesophageal Reflux Disease, Adult Gastroesophageal reflux disease (GERD) happens when acid from your stomach flows up into the esophagus. When acid comes in contact with the esophagus, the acid causes soreness (inflammation) in the esophagus. Over time, GERD may create small holes (ulcers) in the lining of the esophagus. CAUSES   Increased body weight. This puts pressure on the stomach, making acid rise from the stomach into the esophagus.  Smoking. This increases acid production in the stomach.  Drinking alcohol. This causes decreased pressure in the lower esophageal sphincter (valve or ring of muscle between the esophagus and stomach), allowing acid from the stomach into the esophagus.  Late evening meals and a full stomach. This increases pressure and acid production in the stomach.  A malformed lower esophageal sphincter. Sometimes, no cause is found. SYMPTOMS   Burning pain in the lower part of the mid-chest behind the breastbone and in the mid-stomach area. This may occur twice a week or more often.  Trouble swallowing.  Sore throat.  Dry cough.  Asthma-like symptoms including chest tightness, shortness of breath, or wheezing. DIAGNOSIS  Your caregiver may be able to diagnose GERD based on your symptoms. In some cases, X-rays and other tests may be done to check for complications or to check the condition of your stomach and esophagus. TREATMENT  Your  caregiver may recommend over-the-counter or prescription medicines to help decrease acid production. Ask your caregiver before starting or adding any new medicines.  HOME CARE INSTRUCTIONS   Change the factors that you can control. Ask your caregiver for guidance concerning weight loss, quitting smoking, and alcohol consumption.  Avoid foods and drinks that make your symptoms worse, such as:  Caffeine or alcoholic drinks.  Chocolate.  Peppermint or mint flavorings.  Garlic and onions.  Spicy foods.  Citrus fruits, such as oranges, lemons, or limes.  Tomato-based foods such as sauce, chili, salsa, and pizza.  Fried and fatty foods.  Avoid lying down for the 3 hours prior to your bedtime or prior to taking a nap.  Eat small, frequent meals instead of large meals.  Wear loose-fitting clothing. Do not wear anything tight around your waist that causes pressure on your stomach.  Raise the head of your bed 6 to 8 inches with wood blocks to help you sleep. Extra pillows will not help.  Only take over-the-counter or prescription medicines for pain, discomfort, or fever as directed by your caregiver.  Do not take aspirin, ibuprofen, or other nonsteroidal anti-inflammatory drugs (NSAIDs). SEEK IMMEDIATE MEDICAL CARE IF:   You have pain in your arms, neck, jaw, teeth, or back.  Your pain increases or changes in intensity or duration.  You develop nausea, vomiting, or sweating (diaphoresis).  You develop shortness of breath, or you faint.  Your vomit is green, yellow, black, or looks like coffee grounds or blood.  Your stool is red, bloody, or black. These symptoms could be signs of other problems, such  as heart disease, gastric bleeding, or esophageal bleeding. MAKE SURE YOU:   Understand these instructions.  Will watch your condition.  Will get help right away if you are not doing well or get worse. Document Released: 05/23/2005 Document Revised: 11/05/2011 Document  Reviewed: 03/02/2011 [facility] Patient Information 2015 [place], [place]. This information is not intended to replace advice given to you by your health care provider. Make sure you discuss any questions you have with your health care provider.

## 2015-03-04 NOTE — Progress Notes (Signed)
Subjective: Pt was seen and examined at bedside today. Denies having any CP, tightness, or heaviness. Denies having any SOB, palpitations, diaphoresis, lightheadedness, syncope, or epigastric tenderness. No abdominal pain, nausea, vomiting, or diarrhea. No present complaints.   Objective: Vital signs in last 24 hours: Filed Vitals:   03/04/15 0000 03/04/15 0055 03/04/15 0101 03/04/15 1032  BP: 156/87  163/78   Pulse: 64  65   Temp:   97.9 F (36.6 C)   TempSrc:   Oral   Resp:   18   Height:   (1.702 m)    Weight:  205 lb 0.4 oz (93 kg)    SpO2: 100%  97% 99%   Weight change:   Intake/Output Summary (Last 24 hours) at 03/04/15 1429 Last data filed at 03/04/15 1200  Gross per 24 hour  Intake    120 ml  Output      0 ml  Net    120 ml   Physical Exam  Constitutional: She is oriented to person, place, and time and well-developed, well-nourished, and in no acute distress. HENT:  Head: Normocephalic and atraumatic.  Eyes: PERRL, EOMI Neck: Normal range of motion. No JVD present. No tracheal deviation present.  Cardiovascular: Normal rate, regular rhythm, normal heart sounds and intact distal pulses.  Pulmonary/Chest: Effort normal. No respiratory distress.No wheezes or crackles.  Abdominal: Soft. Bowel sounds are normal. No distension or tenderness. No rebound or guarding.  Musculoskeletal: No edema.  Neurological: She is alert and oriented to person, place, and time.  Skin: Skin is warm and dry. She is not diaphoretic. No rash.    Lab Results: Basic Metabolic Panel:  Recent Labs Lab 03/04/15 0109 03/04/15 0312  NA 141 138  K 3.7 3.6  CL 104 102  CO2 30 30  GLUCOSE 130* 139*  BUN 12 12  CREATININE 0.86 0.85  CALCIUM 9.3 9.0   Liver Function Tests:  Recent Labs Lab 03/03/15 2054  AST 18  ALT 13*  ALKPHOS 79  BILITOT 0.4  PROT 6.7  ALBUMIN 3.4*   CBC:  Recent Labs Lab 03/03/15 2054  WBC 9.2  NEUTROABS 5.4  HGB 12.0  HCT 37.5  MCV 86.6    PLT 313   Cardiac Enzymes:  Recent Labs Lab 03/04/15 0312 03/04/15 0905  TROPONINI <0.03 <0.03   CBG:  Recent Labs Lab 03/04/15 0056 03/04/15 0420 03/04/15 0756  GLUCAP 128* 126* 97   Coagulation:  Recent Labs Lab 03/03/15 2054  LABPROT 13.0  INR 0.96   Studies/Results: Dg Chest 2 View  03/03/2015   CLINICAL DATA:  79 year old female with chest pain.  EXAM: CHEST  2 VIEW  COMPARISON:  Chest radiograph dated 09/21/2013  FINDINGS: Two views of the chest demonstrate clear lungs. No pleural effusion, consolidation, or pneumothorax. Moderate cardiomegaly. The osseous structures are grossly unremarkable.  IMPRESSION: No active cardiopulmonary disease.   Electronically Signed   By: Elgie Collard M.D.   On: 03/03/2015 21:19   Medications: I have reviewed the patient's current medications. Scheduled Meds: . gabapentin  300 mg Oral QHS  . heparin  5,000 Units Subcutaneous 3 times per day  . irbesartan  150 mg Oral Daily   And  . hydrochlorothiazide  12.5 mg Oral Daily  . insulin aspart  0-15 Units Subcutaneous 6 times per day  . pantoprazole  40 mg Oral Daily  . simvastatin  40 mg Oral QHS   Continuous Infusions:  PRN Meds:.albuterol, clonazePAM, docusate sodium, nitroGLYCERIN, senna-docusate Assessment/Plan:  Principal Problem:   Atypical chest pain Active Problems:   Type 2 diabetes mellitus, controlled   Hyperlipidemia   Essential hypertension   Asthma   GERD  Atypical Chest Pain: Most likely 2/2 GERD. Pt has a history of non-compliance with her PPI. Today she denies having any CP, tightness, or heaviness. Denies having any SOB, palpitations, diaphoresis, lightheadedness, syncope, or epigastric tenderness. EKG did not show any acute ST/T wave changes and Troponins remain negative. BNP is normal.  - Give Protonix  - Cont pain mgmt - Patient is stable and will be discharged today.  - [facility]  Asthma: Patient reports good control of her asthma at baseline.  Will hold beta agonists at this time for chest pain rule out. - Oxygen PRN - Cont. Home meds Albuterol and Symbicort  DMII: On metformin and Novolog 70/30 17 units at dinner. - Hold metformin - SSI  GERD: Noncompliant with medications. I discussed the importance of compliance with medication with the patient today.  - Protonix 40 mg PO  HTN: elevated in the ED, likely 2/2 pain. - Cont Avapro and HCTZ  HLD: - Simvastatin 40 mg daily  Back Pain: - Continue Gabapentin 300 mg qHS  Anxiety: - Continue Clonazepam 1 mg BID PRN anxiety  FEN/GI: - Senna and Colace - NPO  DVT Ppx: Heparin  Dispo: Disposition is deferred at this time, awaiting improvement of current medical problems.  Anticipated discharge in approximately 0 day(s).   The patient does have a current PCP Adrian Prince(Stephen South, MD) and [facility] after discharge.  The patient does not have transportation limitations that hinder transportation to clinic appointments.  .Services Needed at time of discharge: Y = Yes, Blank = No PT:   OT:   RN:   Equipment:   Other:       John GiovanniVasundhra Nawal Burling, MD 03/04/2015, 2:29 PM

## 2015-03-05 LAB — HEMOGLOBIN A1C
HEMOGLOBIN A1C: 6.7 % — AB (ref 4.8–5.6)
Mean Plasma Glucose: 146 mg/dL

## 2015-04-07 ENCOUNTER — Other Ambulatory Visit: Payer: Self-pay | Admitting: Internal Medicine

## 2015-06-27 ENCOUNTER — Encounter: Payer: Self-pay | Admitting: *Deleted

## 2015-06-27 ENCOUNTER — Ambulatory Visit (INDEPENDENT_AMBULATORY_CARE_PROVIDER_SITE_OTHER): Payer: Medicare Other | Admitting: Cardiovascular Disease

## 2015-06-27 ENCOUNTER — Encounter: Payer: Self-pay | Admitting: Cardiovascular Disease

## 2015-06-27 VITALS — BP 120/72 | HR 73 | Ht 67.0 in | Wt 197.0 lb

## 2015-06-27 DIAGNOSIS — I10 Essential (primary) hypertension: Secondary | ICD-10-CM

## 2015-06-27 DIAGNOSIS — R0789 Other chest pain: Secondary | ICD-10-CM | POA: Diagnosis not present

## 2015-06-27 NOTE — Progress Notes (Signed)
Cardiology Office Note   Date:  06/27/2015   ID:  Racquel, Arkin 07/18/1936, MRN [id number]  PCP:  Julian Hy, MD  Cardiologist:   Vesta Mixer, MD   Chief Complaint  Patient presents with  . Follow-up    Surgical Clearance for knee surgery.    Problem List 1. Hx of CP - negative myoview 2015 2. Essential hypertension 3. Hyperlipidemia 4. Diabetes mellitus   History of Present Illness: AALINA BREGE is a 79 y.o. female who presents for evaluation of her atypical chest pain .  She needs to have right  knee surgery  And is seen for pre-op evaluation .   Was admitted for some atypical chest pain in July, 2016. It was thought to be due to gastroesophageal reflux. Troponin levels were normal.  She had a previous normal Myoview study in 2015.  No issues with CP recently   Past Medical History  Diagnosis Date  . Asthma   . GERD (gastroesophageal reflux disease)   . Hypertension   . CAD (coronary artery disease)   . Obesity   . Diabetes mellitus   . Cervical spondylosis 03/26/2012  . Benign paroxysmal positional vertigo 11/05/2012  . CHF (congestive heart failure) (HCC)   . Shortness of breath   . Anxiety     Past Surgical History  Procedure Laterality Date  . Cholecystectomy    . Appendectomy    . Abdominal hysterectomy    . Rotator cuff repair  2003    Right  . Lumbar laminectomy  2002  . Cesarean section    . Ear pins bilaterally       Current Outpatient Prescriptions  Medication Sig Dispense Refill  . albuterol (PROVENTIL HFA) 108 (90 BASE) MCG/ACT inhaler Inhale 2 puffs into the lungs every 6 (six) hours as needed for shortness of breath. 6 Inhaler 0  . clonazePAM (KLONOPIN) 1 MG tablet Take 1 mg by mouth 2 (two) times daily as needed for anxiety.     . docusate sodium (STOOL SOFTENER) 100 MG capsule Take 1 capsule (100 mg total) by mouth daily as needed for mild constipation or moderate constipation. 30 capsule 3  . gabapentin  (NEURONTIN) 300 MG capsule Take 300 mg by mouth at bedtime.  5  . metFORMIN (GLUCOPHAGE) 500 MG tablet Take 500 mg by mouth 2 (two) times daily with a meal.     . NOVOLOG MIX 70/30 FLEXPEN (70-30) 100 UNIT/ML SUPN Inject 17 Units into the skin daily with supper.     Marland Kitchen omeprazole (PRILOSEC) 20 MG capsule Take 1 capsule by mouth 2 (two) times daily.    . simvastatin (ZOCOR) 40 MG tablet Take 40 mg by mouth at bedtime.    . SYMBICORT 80-4.5 MCG/ACT inhaler Inhale 1 puff into the lungs 2 (two) times daily.  3  . valsartan-hydrochlorothiazide (DIOVAN-HCT) 160-12.5 MG per tablet Take 1 tablet by mouth daily.     No current facility-administered medications for this visit.    Allergies:   Contrast media; Sulfa antibiotics; Aspirin; and Oxycodone-acetaminophen    Social History:  The patient  reports that she has never smoked. She has never used smokeless tobacco. She reports that she does not drink alcohol or use illicit drugs.   Family History:  The patient's family history includes Breast cancer (age of onset: 60) in her sister; Cancer in her mother; Coronary artery disease in her father; Coronary artery disease (age of onset: 55) in her sister. There is no  history of Colon cancer.    ROS:  Please see the history of present illness.    Review of Systems: Constitutional:  denies fever, chills, diaphoresis, appetite change and fatigue.  HEENT: denies photophobia, eye pain, redness, hearing loss, ear pain, congestion, sore throat, rhinorrhea, sneezing, neck pain, neck stiffness and tinnitus.  Respiratory: denies SOB, DOE, cough, chest tightness, and wheezing.  Cardiovascular: denies chest pain, palpitations and leg swelling.  Gastrointestinal: denies nausea, vomiting, abdominal pain, diarrhea, constipation, blood in stool.  Genitourinary: denies dysuria, urgency, frequency, hematuria, flank pain and difficulty urinating.  Musculoskeletal: denies  myalgias, back pain, joint swelling, arthralgias  and gait problem.   Skin: denies pallor, rash and wound.  Neurological: denies dizziness, seizures, syncope, weakness, light-headedness, numbness and headaches.   Hematological: denies adenopathy, easy bruising, personal or family bleeding history.  Psychiatric/ Behavioral: denies suicidal ideation, mood changes, confusion, nervousness, sleep disturbance and agitation.       All other systems are reviewed and negative.    PHYSICAL EXAM: VS:  BP 120/72 mmHg  Pulse 73  Ht 5\' 7"  (1.702 m)  Wt 197 lb (89.359 kg)  BMI 30.85 kg/m2 , BMI Body mass index is 30.85 kg/(m^2). GEN: Well nourished, well developed, in no acute distress HEENT: normal Neck: no JVD, carotid bruits, or masses Cardiac: RRR; no murmurs, rubs, or gallops,no edema  Respiratory:  clear to auscultation bilaterally, normal work of breathing GI: soft, nontender, nondistended, + BS MS: no deformity or atrophy Skin: warm and dry, no rash Neuro:  Strength and sensation are intact Psych: normal   EKG:  EKG is ordered today. The ekg ordered today demonstrates NSR at 73.  NS T wave abn.    Recent Labs: 03/03/2015: ALT 13*; Hemoglobin 12.0; Platelets 313 03/04/2015: B Natriuretic Peptide 12.3; BUN 12; Creatinine, Ser 0.85; Potassium 3.6; Sodium 138    Lipid Panel    Component Value Date/Time   CHOL 137 08/04/2014 1111   TRIG 65 08/04/2014 1111   TRIG 98 04/12/2009   HDL 77 08/04/2014 1111   CHOLHDL 1.8 08/04/2014 1111   VLDL 13 08/04/2014 1111   LDLCALC 47 08/04/2014 1111      Wt Readings from Last 3 Encounters:  06/27/15 197 lb (89.359 kg)  03/04/15 205 lb 0.4 oz (93 kg)  11/16/14 205 lb (92.987 kg)      Other studies Reviewed: Additional studies/ records that were reviewed today include: . Review of the above records demonstrates:    ASSESSMENT AND PLAN:  1.  Atypical chest pain: Mrs. Giangrande is seen today for preoperative visit for her history of atypical chest pain. She had a negative Myoview study  last year. She's not had any recurrent episodes of chest pain. She did have some gastroesophageal reflux in July   but her workup in the hospital was negative.  There is no indication that she has any significant coronary artery disease. She is at low risk for her upcoming right knee replacement.  2. Essential hypertension: Followed by her medical doctor 3. Hyperlipidemia: Followed by her medical doctor   Current medicines are reviewed at length with the patient today.  The patient does not have concerns regarding medicines.  The following changes have been made:  no change  Labs/ tests ordered today include:  No orders of the defined types were placed in this encounter.     Disposition:   FU with me as needed.       Kourtland Coopman, Deloris PingPhilip J, MD  06/27/2015 8:34 AM  [facility] [place], [place], [place]  [address] Phone: [phone]; Fax: [phone]   [facility]  [address] [place], [place]  [address] [phone]   Fax [phone]

## 2015-06-27 NOTE — Patient Instructions (Signed)
Medication Instructions:  None  Labwork: None  Testing/Procedures: None  Follow-Up: Your physician recommends that you schedule a follow-up appointment as needed with Dr. Nahser.    Any Other Special Instructions Will Be Listed Below (If Applicable).     If you need a refill on your cardiac medications before your next appointment, please call your pharmacy.   

## 2015-07-04 ENCOUNTER — Telehealth: Payer: Self-pay | Admitting: Cardiovascular Disease

## 2015-07-04 NOTE — Telephone Encounter (Signed)
New Message  Pt stated office doing knee surgery has not received clearance from Dr Elease HashimotoNahser OV from 10/31. Please call back and discuss.

## 2015-07-04 NOTE — Telephone Encounter (Signed)
Pt requested clearance for knee surgery to Dr Madelon Lipsaffrey. Pt advised  I will fax Dr Harvie BridgeNahser's 06/27/15 office note that states low risk for knee surgery to Dr Madelon Lipsaffrey.

## 2015-08-03 ENCOUNTER — Ambulatory Visit: Payer: Self-pay | Admitting: Physician Assistant

## 2015-08-03 NOTE — H&P (Signed)
TOTAL KNEE ADMISSION H&P  Patient is being admitted for right total knee arthroplasty.  Subjective:  Chief Complaint:right knee pain.  HPI: [name] [name], 79 y.o. female, has a history of pain and functional disability in the right knee due to arthritis and has failed non-surgical conservative treatments for greater than 12 weeks to includeNSAID's and/or analgesics, corticosteriod injections, use of assistive devices and activity modification.  Onset of symptoms was gradual, starting 8 years ago with gradually worsening course since that time. The patient noted no past surgery on the right knee(s).  Patient currently rates pain in the right knee(s) at 10 out of 10 with activity. Patient has night pain, worsening of pain with activity and weight bearing, pain that interferes with activities of daily living, pain with passive range of motion, crepitus and joint swelling.  Patient has evidence of periarticular osteophytes and joint space narrowing by imaging studies.There is no active infection.  Patient Active Problem List   Diagnosis Date Noted  . Atypical chest pain   . Right knee pain 11/17/2014  . Osteoarthritis 09/16/2014  . Fall at home 09/16/2014  . Acute low back pain 08/30/2014  . Skin abnormality 07/30/2013  . Hypercalcemia 04/08/2013  . Lumbar spondylosis with myelopathy 06/24/2012  . Preventative health care 06/24/2012  . Cervical spondylosis 03/26/2012  . CHRONIC RHINITIS 06/02/2010  . Type 2 diabetes mellitus, controlled (HCC) 06/22/2009  . Hyperlipidemia 06/22/2009  . Essential hypertension 06/22/2009  . RESPIRATORY FAILURE, CHRONIC 06/07/2009  . GERD 06/07/2009  . Asthma 01/07/2008   Past Medical History  Diagnosis Date  . Asthma   . GERD (gastroesophageal reflux disease)   . Hypertension   . CAD (coronary artery disease)   . Obesity   . Diabetes mellitus   . Cervical spondylosis 03/26/2012  . Benign paroxysmal positional vertigo 11/05/2012  . CHF (congestive  heart failure) (HCC)   . Shortness of breath   . Anxiety     Past Surgical History  Procedure Laterality Date  . Cholecystectomy    . Appendectomy    . Abdominal hysterectomy    . Rotator cuff repair  2003    Right  . Lumbar laminectomy  2002  . Cesarean section    . Ear pins bilaterally       (Not in a hospital admission) Allergies  Allergen Reactions  . Contrast Media [Iodinated Diagnostic Agents] Shortness Of Breath and Rash    13 hour prep  . Sulfa Antibiotics Swelling  . Aspirin Other (See Comments)     heart palpitations  . Oxycodone-Acetaminophen Rash    Social History  Substance Use Topics  . Smoking status: Never Smoker   . Smokeless tobacco: Never Used  . Alcohol Use: No    Family History  Problem Relation Age of Onset  . Cancer Mother     Question pancreatic  . Coronary artery disease Father   . Breast cancer Sister [id number]  . Coronary artery disease Sister [id number]  . Colon cancer Neg Hx      Review of Systems  HENT: Positive for hearing loss.   Respiratory: Positive for shortness of breath.   Cardiovascular: Positive for chest pain.  Musculoskeletal: Positive for back pain and joint pain.  Neurological: Positive for dizziness.  All other systems reviewed and are negative.   Objective:  Physical Exam  Constitutional: She is oriented to person, place, and time. She appears well-developed and well-nourished. No distress.  HENT:  Head: Normocephalic and atraumatic.  Nose: Nose normal.  Eyes: Conjunctivae and EOM are normal. Pupils are equal, round, and reactive to light.  Neck: Normal range of motion. Neck supple.  Cardiovascular: Normal rate, regular rhythm, normal heart sounds and intact distal pulses.   Respiratory: Effort normal and breath sounds normal. No respiratory distress. She has no wheezes.  GI: Soft. Bowel sounds are normal. She exhibits no distension. There is no tenderness.  Musculoskeletal:       Right knee: She exhibits swelling and bony  tenderness. She exhibits normal range of motion, no erythema, no LCL laxity and no MCL laxity. Tenderness found.  Lymphadenopathy:    She has no cervical adenopathy.  Neurological: She is alert and oriented to person, place, and time. No cranial nerve deficit.  Skin: Skin is warm and dry. No rash noted. No erythema.  Psychiatric: She has a normal mood and affect. Her behavior is normal.    Vital signs in last 24 hours: @  Labs:   Estimated body mass index is 30.85 kg/(m^2) as calculated from the following:   Height as of 06/27/15:  (1.702 m).   Weight as of 06/27/15: 89.359 kg (197 lb).   Imaging Review Plain radiographs demonstrate moderate degenerative joint disease of the bilaterally knee(s). The overall alignment ismild varus. The bone quality appears to be good for age and reported activity level.  Assessment/Plan:  End stage arthritis, right knee   The patient history, physical examination, clinical judgment of the provider and imaging studies are consistent with end stage degenerative joint disease of the right knee(s) and total knee arthroplasty is deemed medically necessary. The treatment options including medical management, injection therapy arthroscopy and arthroplasty were discussed at length. The risks and benefits of total knee arthroplasty were presented and reviewed. The risks due to aseptic loosening, infection, stiffness, patella tracking problems, thromboembolic complications and other imponderables were discussed. The patient acknowledged the explanation, agreed to proceed with the plan and consent was signed. Patient is being admitted for inpatient treatment for surgery, pain control, PT, OT, prophylactic antibiotics, VTE prophylaxis, progressive ambulation and ADL's and discharge planning. The patient is planning to be discharged home with home health services

## 2015-08-15 ENCOUNTER — Encounter (HOSPITAL_COMMUNITY)
Admission: RE | Admit: 2015-08-15 | Discharge: 2015-08-15 | Disposition: A | Discharge status: Home / Self Care | Payer: Medicare Other | Source: Ambulatory Visit | Attending: Orthopedic Surgery | Admitting: Orthopedic Surgery

## 2015-08-15 ENCOUNTER — Encounter (HOSPITAL_COMMUNITY): Payer: Self-pay

## 2015-08-15 DIAGNOSIS — Z0183 Encounter for blood typing: Secondary | ICD-10-CM | POA: Diagnosis not present

## 2015-08-15 DIAGNOSIS — M170 Bilateral primary osteoarthritis of knee: Secondary | ICD-10-CM | POA: Insufficient documentation

## 2015-08-15 DIAGNOSIS — Z01812 Encounter for preprocedural laboratory examination: Secondary | ICD-10-CM | POA: Insufficient documentation

## 2015-08-15 HISTORY — DX: Other complications of anesthesia, initial encounter: T88.59XA

## 2015-08-15 HISTORY — DX: Adverse effect of unspecified anesthetic, initial encounter: T41.45XA

## 2015-08-15 LAB — CBC WITH DIFFERENTIAL/PLATELET
BASOS PCT: 1 %
Basophils Absolute: 0 10*3/uL (ref 0.0–0.1)
Eosinophils Absolute: 0.2 10*3/uL (ref 0.0–0.7)
Eosinophils Relative: 2 %
HEMATOCRIT: 41.3 % (ref 36.0–46.0)
HEMOGLOBIN: 12.9 g/dL (ref 12.0–15.0)
LYMPHS PCT: 34 %
Lymphs Abs: 2.2 10*3/uL (ref 0.7–4.0)
MCH: 27.4 pg (ref 26.0–34.0)
MCHC: 31.2 g/dL (ref 30.0–36.0)
MCV: 87.9 fL (ref 78.0–100.0)
MONOS PCT: 7 %
Monocytes Absolute: 0.5 10*3/uL (ref 0.1–1.0)
NEUTROS ABS: 3.8 10*3/uL (ref 1.7–7.7)
NEUTROS PCT: 56 %
Platelets: 291 10*3/uL (ref 150–400)
RBC: 4.7 MIL/uL (ref 3.87–5.11)
RDW: 14.8 % (ref 11.5–15.5)
WBC: 6.7 10*3/uL (ref 4.0–10.5)

## 2015-08-15 LAB — PROTIME-INR
INR: 1 (ref 0.00–1.49)
Prothrombin Time: 13.4 seconds (ref 11.6–15.2)

## 2015-08-15 LAB — COMPREHENSIVE METABOLIC PANEL
ALBUMIN: 3.7 g/dL (ref 3.5–5.0)
ALK PHOS: 69 U/L (ref 38–126)
ALT: 14 U/L (ref 14–54)
AST: 18 U/L (ref 15–41)
Anion gap: 8 (ref 5–15)
BILIRUBIN TOTAL: 0.7 mg/dL (ref 0.3–1.2)
BUN: 16 mg/dL (ref 6–20)
CALCIUM: 10.1 mg/dL (ref 8.9–10.3)
CO2: 26 mmol/L (ref 22–32)
Chloride: 104 mmol/L (ref 101–111)
Creatinine, Ser: 0.97 mg/dL (ref 0.44–1.00)
GFR calc Af Amer: >60 mL/min (ref >60)
GFR calc non Af Amer: 55 mL/min — ABNORMAL LOW (ref >60)
GLUCOSE: 95 mg/dL (ref 65–99)
Potassium: 4.3 mmol/L (ref 3.5–5.1)
Sodium: 138 mmol/L (ref 135–145)
TOTAL PROTEIN: 6.8 g/dL (ref 6.5–8.1)

## 2015-08-15 LAB — TYPE AND SCREEN
ABO/RH(D): O POS
Antibody Screen: POSITIVE
DAT, IgG: NEGATIVE

## 2015-08-15 LAB — APTT: aPTT: 31 seconds (ref 24–37)

## 2015-08-15 LAB — SURGICAL PCR SCREEN
MRSA, PCR: NEGATIVE
STAPHYLOCOCCUS AUREUS: NEGATIVE

## 2015-08-15 LAB — GLUCOSE, CAPILLARY: GLUCOSE-CAPILLARY: 83 mg/dL (ref 65–99)

## 2015-08-15 NOTE — Pre-Procedure Instructions (Addendum)
LEXA CORONADO  08/15/2015      [id number] [place], [place] - [address] [place] [address] Phone: [phone] Fax: [phone]    Your procedure is scheduled on  Friday  08/26/15  Report to [facility] Admitting at 530 A.M.  Call this number if you have problems the morning of surgery:  [phone]   Remember:  Do not eat food or drink liquids after midnight.  Take these medicines the morning of surgery with A SIP OF WATER   ALBUTEROL INHALER, CLONAZEPAM IF NEEDED, OMEPRAZOLE, SYMBICORT INHALER, HYDROCODONE IF NEEDED How to Manage Your Diabetes Before Surgery   Why is it important to control my blood sugar before and after surgery?   Improving blood sugar levels before and after surgery helps healing and can limit problems.  A way of improving blood sugar control is eating a healthy diet by:  - Eating less sugar and carbohydrates  - Increasing activity/exercise  - Talk with your doctor about reaching your blood sugar goals  High blood sugars (greater than 180 mg/dL) can raise your risk of infections and slow down your recovery so you will need to focus on controlling your diabetes during the weeks before surgery.  Make sure that the doctor who takes care of your diabetes knows about your planned surgery including the date and location.  How do I manage my blood sugars before surgery?   Check your blood sugar at least 4 times a day, 2 days before surgery to make sure that they are not too high or low.   Check your blood sugar the morning of your surgery when you wake up and every 2               hours until you get to the Short-Stay unit.  If your blood sugar is less than 70 mg/dL, you will need to treat for low blood sugar by:  Treat a low blood sugar (less than 70 mg/dL) with 1/2 cup of clear juice (cranberry or apple), 4 glucose tablets, OR glucose gel.  Recheck blood sugar in 15 minutes after  treatment (to make sure it is greater than 70 mg/dL).  If blood sugar is not greater than 70 mg/dL on re-check, call [phone] for further instructions.   Report your blood sugar to the Short-Stay nurse when you get to Short-Stay.  References:  [facility], 2007 "How to Manage your Diabetes Before and After Surgery".  What do I do about my diabetes medications?   Do not take oral diabetes medicines (pills) the morning of surgery.  THE NIGHT BEFORE SURGERY, take 10 units of  70/30 NOVOLOG Insulin.    Do not take other diabetes injectables the day of surgery including Byetta, Victoza, Bydureon, and Trulicity.    If your CBG is greater than 220 mg/dL, you may take 1/2 of your sliding scale (correction) dose of insulin.   For patients with "Insulin Pumps":  Contact your diabetes doctor for specific instructions before surgery.   Decrease basal insulin rates by 20% at midnight the night before surgery.  Note that if your surgery is planned to be longer than 2 hours, your insulin pump will be removed and intravenous (IV) insulin will be started and managed by the nurses and anesthesiologist.  You will be able to restart your insulin pump once you are awake and able to manage it.  Make sure to bring insulin pump supplies  to the hospital with you in case your site needs to be changed.        Do not wear jewelry, make-up or nail polish.  Do not wear lotions, powders, or perfumes.  You may wear deodorant.  Do not shave 48 hours prior to surgery.  Men may shave face and neck.  Do not bring valuables to the hospital.  [facility] is not [facility] any belongings or valuables.  Contacts, dentures or bridgework may not be worn into surgery.  Leave your suitcase in the car.  After surgery it may be brought to your room.  For patients admitted to the hospital, discharge time will be determined by your treatment team.  Patients discharged the day of  surgery will not be allowed to drive home.   Name and phone number of your driver:   Special instructions:  SEE PREPARING FOR SURGERY PAMPHLET  Please read over the following fact sheets that you were given. Pain Booklet, Coughing and Deep Breathing, Blood Transfusion Information, MRSA Information and Surgical Site Infection Prevention

## 2015-08-15 NOTE — Progress Notes (Signed)
LINDA CALLED FROM BLOOD BANK AND STATED PATIENT HAS A RARE FORM OF ANTIBODIES THAT THEY WILL SEND SPECIMEN OUT TO ANOTHER LAB FOR FURTHER TESTING.  LINDA STATED THEY HAVE NOTIFIED THE SURGEON'S OFFICE ASKING ABOUT PATIENT COMING IN TO DONATE OWN BLOOD.

## 2015-08-16 LAB — HEMOGLOBIN A1C
Hgb A1c MFr Bld: 6.8 % — ABNORMAL HIGH (ref 4.8–5.6)
Mean Plasma Glucose: 148 mg/dL

## 2015-08-17 ENCOUNTER — Encounter: Payer: Self-pay | Admitting: *Deleted

## 2015-08-19 ENCOUNTER — Ambulatory Visit (HOSPITAL_BASED_OUTPATIENT_CLINIC_OR_DEPARTMENT_OTHER): Payer: Medicare Other | Admitting: Oncology

## 2015-08-19 VITALS — BP 152/70 | HR 82 | Temp 98.6°F | Resp 18 | Ht 67.0 in | Wt 202.4 lb

## 2015-08-19 DIAGNOSIS — R768 Other specified abnormal immunological findings in serum: Secondary | ICD-10-CM

## 2015-08-19 NOTE — Consult Note (Signed)
Reason for Referral: Antibodies against red cells.   HPI: 79 year old woman currently of [place] where she lived the majority of her life. To very pleasant woman with history of degenerative arthritis. Few operations in the past. He had a lumbar surgery in the early 2000s and have tolerated the procedure well without needing any transfusions. She also had  recently shoulder surgery as well as dental extraction without any postoperative bleeding. She also had an appendectomy many years ago without any issues either. She is status post hysterectomy and did require packed red cell transfusions at that time. No issues with transfusions at that time. Patient was planned to undergo a right total knee replacement on 08/03/2015. Her preoperative workup revealed that she has positive antibodies screen which will delay her blood typing. I was asked to comment about these findings and preparation of her rescheduled surgery. Otherwise she had not had any hematological issues in the past. Has not reported any postoperative bleeding or bruising. Otherwise feeling well. She lives with her husband and able to ambulate with the help of a cane. She did have some falls in the past related to her knee issues. Is able to drive her distances.  She does not report any headaches, blurry vision, syncope or seizures. Does not report any fevers or chills or sweats. Does not report cough, wheezing or hemoptysis. Does not report any nausea or vomiting or abdominal pain. Does not report any frequency urgency or hesitancy. She does report skeletal complaints of arthralgias and knee pain. Remainder review of systems unremarkable.   Past Medical History  Diagnosis Date  . Asthma   . GERD (gastroesophageal reflux disease)   . Hypertension   . CAD (coronary artery disease)   . Obesity   . Diabetes mellitus   . Cervical spondylosis 03/26/2012  . Benign paroxysmal positional vertigo 11/05/2012  . CHF (congestive heart failure) (HCC)    . Shortness of breath   . Anxiety   . Complication of anesthesia     AGE 6 APPENDECTOMY WOKE UP, NO TROUBLE SINCE  :  Past Surgical History  Procedure Laterality Date  . Cholecystectomy    . Appendectomy    . Abdominal hysterectomy    . Rotator cuff repair  2003    Right    (X2)  . Lumbar laminectomy  2002  . Cesarean section    . Ear pins bilaterally    . Cataract extraction w/ intraocular lens  implant, bilateral    :   Current outpatient prescriptions:  .  albuterol (PROVENTIL HFA) 108 (90 BASE) MCG/ACT inhaler, Inhale 2 puffs into the lungs every 6 (six) hours as needed for shortness of breath., Disp: 6 Inhaler, Rfl: 0 .  clonazePAM (KLONOPIN) 1 MG tablet, Take 1 mg by mouth 2 (two) times daily as needed for anxiety. , Disp: , Rfl:  .  docusate sodium (STOOL SOFTENER) 100 MG capsule, Take 1 capsule (100 mg total) by mouth daily as needed for mild constipation or moderate constipation., Disp: 30 capsule, Rfl: 3 .  gabapentin (NEURONTIN) 300 MG capsule, Take 300 mg by mouth at bedtime., Disp: , Rfl: 5 .  HUMALOG MIX 75/25 KWIKPEN (75-25) 100 UNIT/ML Kwikpen, As directed, Disp: , Rfl: 2 .  HYDROcodone-acetaminophen (NORCO/VICODIN) 5-325 MG tablet, Take 1 tablet by mouth every 6 (six) hours as needed for moderate pain., Disp: , Rfl:  .  metFORMIN (GLUCOPHAGE) 500 MG tablet, Take 500 mg by mouth 2 (two) times daily with a meal. , Disp: ,  Rfl:  .  methocarbamol (ROBAXIN) 500 MG tablet, Take 500 mg by mouth every 8 (eight) hours as needed for muscle spasms., Disp: , Rfl:  .  NOVOLOG MIX 70/30 FLEXPEN (70-30) 100 UNIT/ML SUPN, Inject 17 Units into the skin daily with supper. , Disp: , Rfl:  .  omeprazole (PRILOSEC) 20 MG capsule, Take 1 capsule by mouth 2 (two) times daily., Disp: , Rfl:  .  simvastatin (ZOCOR) 40 MG tablet, Take 40 mg by mouth at bedtime., Disp: , Rfl:  .  SYMBICORT 80-4.5 MCG/ACT inhaler, Inhale 1 puff into the lungs 2 (two) times daily., Disp: , Rfl: 3 .   valsartan-hydrochlorothiazide (DIOVAN-HCT) 160-12.5 MG per tablet, Take 1 tablet by mouth daily., Disp: , Rfl: :  Allergies  Allergen Reactions  . Contrast Media [Iodinated Diagnostic Agents] Shortness Of Breath and Rash    13 hour prep  . Sulfa Antibiotics Swelling  . Aspirin Other (See Comments)     heart palpitations  . Oxycodone-Acetaminophen Rash  :  Family History  Problem Relation Age of Onset  . Cancer Mother     Question pancreatic  . Coronary artery disease Father   . Breast cancer Sister [id number]  . Coronary artery disease Sister [id number]  . Colon cancer Neg Hx   :  Social History   Social History  . Marital Status: Married    Spouse Name: N/A  . Number of Children: N/A  . Years of Education: N/A   Occupational History  . Not on file.   Social History Main Topics  . Smoking status: Never Smoker   . Smokeless tobacco: Never Used  . Alcohol Use: No  . Drug Use: No  . Sexual Activity: Not on file   Other Topics Concern  . Not on file   Social History Narrative   Patient is retired   2 children  :  Pertinent items are noted in HPI.  Exam: Blood pressure 152/70, pulse 82, temperature 98.6 F (37 C), temperature source Oral, resp. rate 18, height 5\' 7"  (1.702 m), weight 202 lb 6.4 oz (91.808 kg), SpO2 100 %. General appearance: alert and cooperative Head: Normocephalic, without obvious abnormality Throat: lips, mucosa, and tongue normal; teeth and gums normal Neck: no adenopathy Back: negative Resp: clear to auscultation bilaterally Chest wall: no tenderness Cardio: regular rate and rhythm, S1, S2 normal, no murmur, click, rub or gallop GI: soft, non-tender; bowel sounds normal; no masses,  no organomegaly Extremities: extremities normal, atraumatic, no cyanosis or edema Pulses: 2+ and symmetric Skin: Skin color, texture, turgor normal. No rashes or lesions Lymph nodes: Cervical, supraclavicular, and axillary nodes normal.   CBC    Component Value  Date/Time   WBC 6.7 08/15/2015 1245   RBC 4.70 08/15/2015 1245   HGB 12.9 08/15/2015 1245   HCT 41.3 08/15/2015 1245   PLT 291 08/15/2015 1245   MCV 87.9 08/15/2015 1245   MCH 27.4 08/15/2015 1245   MCHC 31.2 08/15/2015 1245   RDW 14.8 08/15/2015 1245   LYMPHSABS 2.2 08/15/2015 1245   MONOABS 0.5 08/15/2015 1245   EOSABS 0.2 08/15/2015 1245   BASOSABS 0.0 08/15/2015 1245      Chemistry      Component Value Date/Time   NA 138 08/15/2015 1245   NA 142 03/19/2012   K 4.3 08/15/2015 1245   CL 104 08/15/2015 1245   CO2 26 08/15/2015 1245   BUN 16 08/15/2015 1245   BUN 18 03/19/2012   CREATININE 0.97 08/15/2015 1245  CREATININE 0.87 08/04/2014 1111   CREATININE 0.9 03/19/2012   GLU 85 03/19/2012      Component Value Date/Time   CALCIUM 10.1 08/15/2015 1245   ALKPHOS 69 08/15/2015 1245   AST 18 08/15/2015 1245   ALT 14 08/15/2015 1245   BILITOT 0.7 08/15/2015 1245     Results for TARYNE, KIGER (MRN [id number]) as of 08/19/2015 10:25  Ref. Range 08/15/2015 12:45  Prothrombin Time Latest Ref Range: 11.6-15.2 seconds 13.4  INR Latest Ref Range: 0.00-1.49  1.00  APTT  Latest Ref Range: 24-37 seconds 31   Results for VIRGILENE, STRYKER (MRN [id number]) as of 08/19/2015 10:25  Ref. Range 08/15/2015 13:00  Sample Expiration Unknown 08/29/2015  DAT, IgG Unknown NEG  Antibody Screen Unknown POS  ABO/RH(D) Unknown O POS    Assessment and Plan:   79 year old woman with the following issues:  1. Positive antibody screen and preparation for upcoming any operation. Her hemoglobin is within normal range as well as her coagulation parameters. She had multiple surgeries in the past and never needed a blood transfusion. She have had a blood transfusion in the past after her hysterectomy without any issues.  The nature of this finding was discussed with the patient and her daughter. The ramifications of this finding was also discussed. I see no hematological reason why she  could not have this operation done. She is not at risk of excessive bleeding given her normal coagulation parameters and normal bleeding history.  If develops postoperative bleeding and does require packed red cell transfusions, I see no contraindication for that. The presence of a positive antibody screen might delay the availability of her transfusion but is not a contraindication. I alerted her to the fact that she might need to wait longer and possibly go with most compatible blood available. The other possibility is for her to donate her own blood in preparation of the surgery and she can do that via the blood bank.  She does not have cardiac history and have been cleared by cardiology which puts the risks of cardiac complications at very low even if there is a delay of red cell transfusions.  All her questions were answered today to her satisfaction.  2. Degenerative arthritis bilateral knees: Holes up with orthopedics and anticipating having surgery in the near future.   Please call with any questions regarding the care of this nice woman.

## 2015-08-19 NOTE — Progress Notes (Signed)
Faxed signed clearance for up coming surgery from an oncologist standpoint to dr caffrey [phone]

## 2015-08-19 NOTE — Progress Notes (Signed)
Please see consult note.  

## 2015-08-26 ENCOUNTER — Inpatient Hospital Stay (HOSPITAL_COMMUNITY): Admission: RE | Admit: 2015-08-26 | Payer: Medicare Other | Source: Ambulatory Visit | Admitting: Orthopedic Surgery

## 2015-08-26 ENCOUNTER — Encounter (HOSPITAL_COMMUNITY): Admission: RE | Payer: Self-pay | Source: Ambulatory Visit

## 2015-08-26 SURGERY — ARTHROPLASTY, KNEE, TOTAL
Anesthesia: General | Site: Knee | Laterality: Right

## 2015-09-02 ENCOUNTER — Ambulatory Visit (HOSPITAL_COMMUNITY)
Admission: RE | Admit: 2015-09-02 | Discharge: 2015-09-02 | Disposition: A | Discharge status: Home / Self Care | Payer: Medicare Other | Source: Ambulatory Visit | Attending: Vascular Surgery | Admitting: Vascular Surgery

## 2015-09-02 ENCOUNTER — Other Ambulatory Visit (HOSPITAL_COMMUNITY): Payer: Self-pay | Admitting: Endocrinology

## 2015-09-02 DIAGNOSIS — E119 Type 2 diabetes mellitus without complications: Secondary | ICD-10-CM | POA: Diagnosis not present

## 2015-09-02 DIAGNOSIS — R600 Localized edema: Secondary | ICD-10-CM

## 2015-09-02 DIAGNOSIS — I10 Essential (primary) hypertension: Secondary | ICD-10-CM | POA: Diagnosis not present

## 2015-09-13 ENCOUNTER — Ambulatory Visit: Payer: Self-pay | Admitting: Physician Assistant

## 2015-09-13 NOTE — H&P (Signed)
TOTAL KNEE ADMISSION H&P  Patient is being admitted for right total knee arthroplasty.  Subjective:  Chief Complaint:right knee pain.  HPI: [name] [name], 80 y.o. female, has a history of pain and functional disability in the right knee due to arthritis and has failed non-surgical conservative treatments for greater than 12 weeks to includeNSAID's and/or analgesics, corticosteriod injections, use of assistive devices and activity modification.  Onset of symptoms was gradual, starting 8 years ago with gradually worsening course since that time. The patient noted no past surgery on the right knee(s).  Patient currently rates pain in the right knee(s) at 10 out of 10 with activity. Patient has night pain, worsening of pain with activity and weight bearing, pain that interferes with activities of daily living, pain with passive range of motion, crepitus and joint swelling.  Patient has evidence of periarticular osteophytes and joint space narrowing by imaging studies.There is no active infection.  Patient Active Problem List   Diagnosis Date Noted  . Atypical chest pain   . Right knee pain 11/17/2014  . Osteoarthritis 09/16/2014  . Fall at home 09/16/2014  . Acute low back pain 08/30/2014  . Skin abnormality 07/30/2013  . Hypercalcemia 04/08/2013  . Lumbar spondylosis with myelopathy 06/24/2012  . Preventative health care 06/24/2012  . Cervical spondylosis 03/26/2012  . CHRONIC RHINITIS 06/02/2010  . Type 2 diabetes mellitus, controlled (HCC) 06/22/2009  . Hyperlipidemia 06/22/2009  . Essential hypertension 06/22/2009  . RESPIRATORY FAILURE, CHRONIC 06/07/2009  . GERD 06/07/2009  . Asthma 01/07/2008   Past Medical History  Diagnosis Date  . Asthma   . GERD (gastroesophageal reflux disease)   . Hypertension   . CAD (coronary artery disease)   . Obesity   . Diabetes mellitus   . Cervical spondylosis 03/26/2012  . Benign paroxysmal positional vertigo 11/05/2012  . CHF (congestive  heart failure) (HCC)   . Shortness of breath   . Anxiety     Past Surgical History  Procedure Laterality Date  . Cholecystectomy    . Appendectomy    . Abdominal hysterectomy    . Rotator cuff repair  2003    Right  . Lumbar laminectomy  2002  . Cesarean section    . Ear pins bilaterally       (Not in a hospital admission) Allergies  Allergen Reactions  . Contrast Media [Iodinated Diagnostic Agents] Shortness Of Breath and Rash    13 hour prep  . Sulfa Antibiotics Swelling  . Aspirin Other (See Comments)     heart palpitations  . Oxycodone-Acetaminophen Rash    Social History  Substance Use Topics  . Smoking status: Never Smoker   . Smokeless tobacco: Never Used  . Alcohol Use: No    Family History  Problem Relation Age of Onset  . Cancer Mother     Question pancreatic  . Coronary artery disease Father   . Breast cancer Sister 48  . Coronary artery disease Sister 49  . Colon cancer Neg Hx      Review of Systems  HENT: Positive for hearing loss.   Respiratory: Positive for shortness of breath.   Cardiovascular: Positive for chest pain.  Musculoskeletal: Positive for back pain and joint pain.  Neurological: Positive for dizziness.  All other systems reviewed and are negative.   Objective:  Physical Exam  Constitutional: She is oriented to person, place, and time. She appears well-developed and well-nourished. No distress.  HENT:  Head: Normocephalic and atraumatic.  Nose: Nose normal.    Eyes: Conjunctivae and EOM are normal. Pupils are equal, round, and reactive to light.  Neck: Normal range of motion. Neck supple.  Cardiovascular: Normal rate, regular rhythm, normal heart sounds and intact distal pulses.   Respiratory: Effort normal and breath sounds normal. No respiratory distress. She has no wheezes.  GI: Soft. Bowel sounds are normal. She exhibits no distension. There is no tenderness.  Musculoskeletal:       Right knee: She exhibits swelling and bony  tenderness. She exhibits normal range of motion, no erythema, no LCL laxity and no MCL laxity. Tenderness found.  Lymphadenopathy:    She has no cervical adenopathy.  Neurological: She is alert and oriented to person, place, and time. No cranial nerve deficit.  Skin: Skin is warm and dry. No rash noted. No erythema.  Psychiatric: She has a normal mood and affect. Her behavior is normal.    Vital signs in last 24 hours: @VSRANGES@  Labs:   Estimated body mass index is 30.85 kg/(m^2) as calculated from the following:   Height as of 06/27/15: 5' 7" (1.702 m).   Weight as of 06/27/15: 89.359 kg (197 lb).   Imaging Review Plain radiographs demonstrate moderate degenerative joint disease of the bilaterally knee(s). The overall alignment ismild varus. The bone quality appears to be good for age and reported activity level.  Assessment/Plan:  End stage arthritis, right knee   The patient history, physical examination, clinical judgment of the provider and imaging studies are consistent with end stage degenerative joint disease of the right knee(s) and total knee arthroplasty is deemed medically necessary. The treatment options including medical management, injection therapy arthroscopy and arthroplasty were discussed at length. The risks and benefits of total knee arthroplasty were presented and reviewed. The risks due to aseptic loosening, infection, stiffness, patella tracking problems, thromboembolic complications and other imponderables were discussed. The patient acknowledged the explanation, agreed to proceed with the plan and consent was signed. Patient is being admitted for inpatient treatment for surgery, pain control, PT, OT, prophylactic antibiotics, VTE prophylaxis, progressive ambulation and ADL's and discharge planning. The patient is planning to be discharged home with home health services   

## 2015-09-16 ENCOUNTER — Other Ambulatory Visit (HOSPITAL_COMMUNITY): Payer: Self-pay | Admitting: *Deleted

## 2015-09-16 NOTE — Pre-Procedure Instructions (Addendum)
SHATHA HOOSER  09/16/2015      [id number] [place], [place] - [address] [place] [address] Phone: [phone] Fax: [phone]    Your procedure is scheduled on Friday, September 30, 2015 at 10:00 AM.  Report to [facility] Entrance "A" Admitting Office at 8:00 AM.  Call this number if you have problems the morning of surgery: [phone]  Any questions prior to day of surgery, please call [phone] between 8 & 4 PM.   Remember:  Do not eat food or drink liquids after midnight Thursday, 09/29/15.  Take these medicines the morning of surgery with A SIP OF WATER: Omeprazole (Prilosec), Symbicort inhaler, Hydrocodone - if needed, Clonazepam (Klonopin) - if needed, Albuterol inhaler - if needed.  How to Manage Your Diabetes Before Surgery   Why is it important to control my blood sugar before and after surgery?   Improving blood sugar levels before and after surgery helps healing and can limit problems.  A way of improving blood sugar control is eating a healthy diet by:  - Eating less sugar and carbohydrates  - Increasing activity/exercise  - Talk with your doctor about reaching your blood sugar goals  High blood sugars (greater than 180 mg/dL) can raise your risk of infections and slow down your recovery so you will need to focus on controlling your diabetes during the weeks before surgery.  Make sure that the doctor who takes care of your diabetes knows about your planned surgery including the date and location.  How do I manage my blood sugars before surgery?   Check your blood sugar at least 4 times a day, 2 days before surgery to make sure that they are not too high or low.   Check your blood sugar the morning of your surgery when you wake up and every 2 hours until you get to the Short-Stay unit.  Treat a low blood sugar (less than 70 mg/dL) with 1/2 cup of clear juice (cranberry or apple), 4 glucose  tablets, OR glucose gel.  Recheck blood sugar in 15 minutes after treatment (to make sure it is greater than 70 mg/dL).  If blood sugar is not greater than 70 mg/dL on re-check, call [phone] for further instructions.   Report your blood sugar to the Short-Stay nurse when you get to Short-Stay.  References:  [facility], 2007 "How to Manage your Diabetes Before and After Surgery".  What do I do about my diabetes medications?   Do not take oral diabetes medicines (pills) the morning of surgery.  THE NIGHT BEFORE SURGERY, take 10 units of humalog  Insulin. And 10u of novolog   None morning of surgery    Do not wear jewelry, make-up or nail polish.  Do not wear lotions, powders, or perfumes.  You may wear deodorant.  Do not shave 48 hours prior to surgery.    Do not bring valuables to the hospital.  [facility] is not responsible for any belongings or valuables.  Contacts, dentures or bridgework may not be worn into surgery.  Leave your suitcase in the car.  After surgery it may be brought to your room.  For patients admitted to the hospital, discharge time will be determined by your treatment team.  Special instructions: See "Preparing for Surgery" Instruction sheet.   Please read over the following fact sheets that you were given. Pain Booklet, Coughing and Deep Breathing, MRSA Information and Surgical Site  Infection Prevention

## 2015-09-19 ENCOUNTER — Encounter (HOSPITAL_COMMUNITY): Payer: Self-pay

## 2015-09-19 ENCOUNTER — Encounter (HOSPITAL_COMMUNITY)
Admission: RE | Admit: 2015-09-19 | Discharge: 2015-09-19 | Disposition: A | Discharge status: Home / Self Care | Payer: Medicare Other | Source: Ambulatory Visit | Attending: Orthopedic Surgery | Admitting: Orthopedic Surgery

## 2015-09-19 DIAGNOSIS — Z0183 Encounter for blood typing: Secondary | ICD-10-CM | POA: Diagnosis not present

## 2015-09-19 DIAGNOSIS — Z01812 Encounter for preprocedural laboratory examination: Secondary | ICD-10-CM | POA: Insufficient documentation

## 2015-09-19 DIAGNOSIS — M170 Bilateral primary osteoarthritis of knee: Secondary | ICD-10-CM | POA: Insufficient documentation

## 2015-09-19 HISTORY — DX: Failed or difficult intubation, initial encounter: T88.4XXA

## 2015-09-19 LAB — CBC
HEMATOCRIT: 35.8 % — AB (ref 36.0–46.0)
HEMOGLOBIN: 11.4 g/dL — AB (ref 12.0–15.0)
MCH: 27.9 pg (ref 26.0–34.0)
MCHC: 31.8 g/dL (ref 30.0–36.0)
MCV: 87.5 fL (ref 78.0–100.0)
Platelets: 310 10*3/uL (ref 150–400)
RBC: 4.09 MIL/uL (ref 3.87–5.11)
RDW: 14.7 % (ref 11.5–15.5)
WBC: 7.5 10*3/uL (ref 4.0–10.5)

## 2015-09-19 LAB — SURGICAL PCR SCREEN
MRSA, PCR: NEGATIVE
STAPHYLOCOCCUS AUREUS: NEGATIVE

## 2015-09-19 LAB — BASIC METABOLIC PANEL
Anion gap: 11 (ref 5–15)
BUN: 11 mg/dL (ref 6–20)
CO2: 26 mmol/L (ref 22–32)
Calcium: 9.8 mg/dL (ref 8.9–10.3)
Chloride: 103 mmol/L (ref 101–111)
Creatinine, Ser: 0.79 mg/dL (ref 0.44–1.00)
Glucose, Bld: 84 mg/dL (ref 65–99)
POTASSIUM: 3.8 mmol/L (ref 3.5–5.1)
SODIUM: 140 mmol/L (ref 135–145)

## 2015-09-19 LAB — GLUCOSE, CAPILLARY: GLUCOSE-CAPILLARY: 91 mg/dL (ref 65–99)

## 2015-09-29 MED ORDER — TRANEXAMIC ACID 1000 MG/10ML IV SOLN
1000.0000 mg | INTRAVENOUS | Status: AC
  Administered 2015-09-30: 1000 mg via INTRAVENOUS
  Filled 2015-09-29 (×2): qty 10

## 2015-09-30 ENCOUNTER — Inpatient Hospital Stay (HOSPITAL_COMMUNITY)
Admission: RE | Admit: 2015-09-30 | Discharge: 2015-10-04 | DRG: 470 | Disposition: A | Discharge status: Home Health | Payer: Medicare Other | Source: Ambulatory Visit | Attending: Orthopedic Surgery | Admitting: Orthopedic Surgery

## 2015-09-30 ENCOUNTER — Encounter (HOSPITAL_COMMUNITY)
Admission: RE | Disposition: A | Discharge status: Home Health | Payer: Self-pay | Source: Ambulatory Visit | Attending: Orthopedic Surgery

## 2015-09-30 ENCOUNTER — Inpatient Hospital Stay (HOSPITAL_COMMUNITY): Payer: Medicare Other | Admitting: Anesthesiology

## 2015-09-30 ENCOUNTER — Inpatient Hospital Stay (HOSPITAL_COMMUNITY): Payer: Medicare Other | Admitting: Vascular Surgery

## 2015-09-30 ENCOUNTER — Encounter (HOSPITAL_COMMUNITY): Payer: Self-pay

## 2015-09-30 DIAGNOSIS — H9190 Unspecified hearing loss, unspecified ear: Secondary | ICD-10-CM | POA: Diagnosis present

## 2015-09-30 DIAGNOSIS — Z881 Allergy status to other antibiotic agents status: Secondary | ICD-10-CM

## 2015-09-30 DIAGNOSIS — J45909 Unspecified asthma, uncomplicated: Secondary | ICD-10-CM | POA: Diagnosis present

## 2015-09-30 DIAGNOSIS — Z91041 Radiographic dye allergy status: Secondary | ICD-10-CM

## 2015-09-30 DIAGNOSIS — I509 Heart failure, unspecified: Secondary | ICD-10-CM | POA: Diagnosis present

## 2015-09-30 DIAGNOSIS — E119 Type 2 diabetes mellitus without complications: Secondary | ICD-10-CM | POA: Diagnosis present

## 2015-09-30 DIAGNOSIS — I2510 Atherosclerotic heart disease of native coronary artery without angina pectoris: Secondary | ICD-10-CM | POA: Diagnosis present

## 2015-09-30 DIAGNOSIS — Z885 Allergy status to narcotic agent status: Secondary | ICD-10-CM | POA: Diagnosis not present

## 2015-09-30 DIAGNOSIS — F419 Anxiety disorder, unspecified: Secondary | ICD-10-CM | POA: Diagnosis present

## 2015-09-30 DIAGNOSIS — E669 Obesity, unspecified: Secondary | ICD-10-CM | POA: Diagnosis present

## 2015-09-30 DIAGNOSIS — I110 Hypertensive heart disease with heart failure: Secondary | ICD-10-CM | POA: Diagnosis present

## 2015-09-30 DIAGNOSIS — D62 Acute posthemorrhagic anemia: Secondary | ICD-10-CM | POA: Diagnosis not present

## 2015-09-30 DIAGNOSIS — I10 Essential (primary) hypertension: Secondary | ICD-10-CM | POA: Diagnosis present

## 2015-09-30 DIAGNOSIS — M1711 Unilateral primary osteoarthritis, right knee: Principal | ICD-10-CM | POA: Diagnosis present

## 2015-09-30 DIAGNOSIS — Z886 Allergy status to analgesic agent status: Secondary | ICD-10-CM | POA: Diagnosis not present

## 2015-09-30 DIAGNOSIS — K219 Gastro-esophageal reflux disease without esophagitis: Secondary | ICD-10-CM | POA: Diagnosis present

## 2015-09-30 DIAGNOSIS — M25561 Pain in right knee: Secondary | ICD-10-CM | POA: Diagnosis present

## 2015-09-30 DIAGNOSIS — Z6831 Body mass index (BMI) 31.0-31.9, adult: Secondary | ICD-10-CM | POA: Diagnosis not present

## 2015-09-30 HISTORY — PX: TOTAL KNEE ARTHROPLASTY: SHX125

## 2015-09-30 LAB — GLUCOSE, CAPILLARY
GLUCOSE-CAPILLARY: 90 mg/dL (ref 65–99)
Glucose-Capillary: 89 mg/dL (ref 65–99)
Glucose-Capillary: 138 mg/dL — ABNORMAL HIGH (ref 65–99)
Glucose-Capillary: 156 mg/dL — ABNORMAL HIGH (ref 65–99)
Glucose-Capillary: 107 mg/dL — ABNORMAL HIGH (ref 65–99)

## 2015-09-30 LAB — CREATININE, SERUM
CREATININE: 0.93 mg/dL (ref 0.44–1.00)
GFR calc Af Amer: >60 mL/min (ref >60)
GFR, EST NON AFRICAN AMERICAN: 58 mL/min — AB (ref >60)

## 2015-09-30 LAB — CBC
HCT: 31.3 % — ABNORMAL LOW (ref 36.0–46.0)
Hemoglobin: 9.8 g/dL — ABNORMAL LOW (ref 12.0–15.0)
MCH: 27.8 pg (ref 26.0–34.0)
MCHC: 31.3 g/dL (ref 30.0–36.0)
MCV: 88.7 fL (ref 78.0–100.0)
PLATELETS: 305 10*3/uL (ref 150–400)
RBC: 3.53 MIL/uL — ABNORMAL LOW (ref 3.87–5.11)
RDW: 15.1 % (ref 11.5–15.5)
WBC: 13.3 10*3/uL — ABNORMAL HIGH (ref 4.0–10.5)

## 2015-09-30 SURGERY — ARTHROPLASTY, KNEE, TOTAL
Anesthesia: General | Site: Knee | Laterality: Right

## 2015-09-30 MED ORDER — HYDROCODONE-ACETAMINOPHEN 5-325 MG PO TABS: 1.0000 | ORAL_TABLET | ORAL | Status: DC | PRN

## 2015-09-30 MED ORDER — ENOXAPARIN SODIUM 30 MG/0.3ML ~~LOC~~ SOLN: 30.0000 mg | Freq: Two times a day (BID) | SUBCUTANEOUS | Status: DC

## 2015-09-30 MED ORDER — FENTANYL CITRATE (PF) 100 MCG/2ML IJ SOLN
25.0000 ug | INTRAMUSCULAR | Status: DC | PRN
  Administered 2015-09-30: 50 ug via INTRAVENOUS

## 2015-09-30 MED ORDER — FENTANYL CITRATE (PF) 100 MCG/2ML IJ SOLN
INTRAMUSCULAR | Status: AC
  Filled 2015-09-30: qty 2

## 2015-09-30 MED ORDER — CHLORHEXIDINE GLUCONATE 4 % EX LIQD: 60.0000 mL | Freq: Once | CUTANEOUS | Status: DC

## 2015-09-30 MED ORDER — INSULIN ASPART 100 UNIT/ML ~~LOC~~ SOLN
4.0000 [IU] | Freq: Three times a day (TID) | SUBCUTANEOUS | Status: DC
  Administered 2015-09-30 – 2015-10-04 (×10): 4 [IU] via SUBCUTANEOUS

## 2015-09-30 MED ORDER — ARTIFICIAL TEARS OP OINT
TOPICAL_OINTMENT | OPHTHALMIC | Status: DC | PRN
  Administered 2015-09-30: 1 via OPHTHALMIC

## 2015-09-30 MED ORDER — METOCLOPRAMIDE HCL 5 MG PO TABS: 5.0000 mg | ORAL_TABLET | Freq: Three times a day (TID) | ORAL | Status: DC | PRN

## 2015-09-30 MED ORDER — MIDAZOLAM HCL 2 MG/2ML IJ SOLN
INTRAMUSCULAR | Status: AC
  Filled 2015-09-30: qty 2

## 2015-09-30 MED ORDER — PANTOPRAZOLE SODIUM 40 MG PO TBEC
40.0000 mg | DELAYED_RELEASE_TABLET | Freq: Every day | ORAL | Status: DC
  Administered 2015-09-30 – 2015-10-04 (×5): 40 mg via ORAL
  Filled 2015-09-30 (×5): qty 1

## 2015-09-30 MED ORDER — GABAPENTIN 300 MG PO CAPS
300.0000 mg | ORAL_CAPSULE | Freq: Every day | ORAL | Status: DC
  Administered 2015-09-30 – 2015-10-03 (×4): 300 mg via ORAL
  Filled 2015-09-30 (×4): qty 1

## 2015-09-30 MED ORDER — LACTATED RINGERS IV SOLN
INTRAVENOUS | Status: DC
  Administered 2015-09-30 (×2): via INTRAVENOUS

## 2015-09-30 MED ORDER — HYDROCHLOROTHIAZIDE 12.5 MG PO CAPS
12.5000 mg | ORAL_CAPSULE | Freq: Every day | ORAL | Status: DC
  Administered 2015-09-30 – 2015-10-04 (×5): 12.5 mg via ORAL
  Filled 2015-09-30 (×5): qty 1

## 2015-09-30 MED ORDER — BUPIVACAINE HCL 0.5 % IJ SOLN
INTRAMUSCULAR | Status: DC | PRN
  Administered 2015-09-30: 3 mL via INTRA_ARTICULAR

## 2015-09-30 MED ORDER — INSULIN ASPART 100 UNIT/ML ~~LOC~~ SOLN
0.0000 [IU] | Freq: Every day | SUBCUTANEOUS | Status: DC
  Administered 2015-10-02: 0 [IU] via SUBCUTANEOUS

## 2015-09-30 MED ORDER — HYDROCODONE-ACETAMINOPHEN 5-325 MG PO TABS
1.0000 | ORAL_TABLET | ORAL | Status: DC | PRN
  Administered 2015-09-30 – 2015-10-03 (×11): 2 via ORAL
  Administered 2015-10-03 – 2015-10-04 (×3): 1 via ORAL
  Filled 2015-09-30: qty 1
  Filled 2015-09-30 (×5): qty 2
  Filled 2015-09-30: qty 1
  Filled 2015-09-30 (×4): qty 2
  Filled 2015-09-30: qty 1
  Filled 2015-09-30: qty 2

## 2015-09-30 MED ORDER — PROPOFOL 10 MG/ML IV BOLUS
INTRAVENOUS | Status: DC | PRN
  Administered 2015-09-30: 200 mg via INTRAVENOUS

## 2015-09-30 MED ORDER — METHYLPREDNISOLONE ACETATE 80 MG/ML IJ SUSP
INTRAMUSCULAR | Status: AC
  Filled 2015-09-30: qty 1

## 2015-09-30 MED ORDER — PROMETHAZINE HCL 25 MG/ML IJ SOLN: 6.2500 mg | INTRAMUSCULAR | Status: DC | PRN

## 2015-09-30 MED ORDER — ONDANSETRON HCL 4 MG/2ML IJ SOLN
INTRAMUSCULAR | Status: DC | PRN
  Administered 2015-09-30: 4 mg via INTRAVENOUS

## 2015-09-30 MED ORDER — IRBESARTAN 150 MG PO TABS
150.0000 mg | ORAL_TABLET | Freq: Every day | ORAL | Status: DC
  Administered 2015-09-30 – 2015-10-04 (×5): 150 mg via ORAL
  Filled 2015-09-30 (×5): qty 1

## 2015-09-30 MED ORDER — SODIUM CHLORIDE 0.9 % IV SOLN: INTRAVENOUS | Status: DC

## 2015-09-30 MED ORDER — POLYETHYLENE GLYCOL 3350 17 G PO PACK: 17.0000 g | PACK | Freq: Every day | ORAL | Status: DC | PRN

## 2015-09-30 MED ORDER — DOCUSATE SODIUM 100 MG PO CAPS: 100.0000 mg | ORAL_CAPSULE | Freq: Every day | ORAL | Status: DC | PRN

## 2015-09-30 MED ORDER — FENTANYL CITRATE (PF) 100 MCG/2ML IJ SOLN
100.0000 ug | Freq: Once | INTRAMUSCULAR | Status: AC
  Administered 2015-09-30: 100 ug via INTRAVENOUS

## 2015-09-30 MED ORDER — ENOXAPARIN SODIUM 30 MG/0.3ML ~~LOC~~ SOLN
30.0000 mg | Freq: Two times a day (BID) | SUBCUTANEOUS | Status: DC
  Administered 2015-10-01 – 2015-10-04 (×7): 30 mg via SUBCUTANEOUS
  Filled 2015-09-30 (×7): qty 0.3

## 2015-09-30 MED ORDER — HYDROMORPHONE HCL 1 MG/ML IJ SOLN
1.0000 mg | INTRAMUSCULAR | Status: DC | PRN
  Administered 2015-09-30 – 2015-10-02 (×9): 1 mg via INTRAVENOUS
  Filled 2015-09-30 (×9): qty 1

## 2015-09-30 MED ORDER — METOCLOPRAMIDE HCL 5 MG/ML IJ SOLN: 5.0000 mg | Freq: Three times a day (TID) | INTRAMUSCULAR | Status: DC | PRN

## 2015-09-30 MED ORDER — SODIUM CHLORIDE 0.9 % IV SOLN
INTRAVENOUS | Status: DC
  Administered 2015-09-30 – 2015-10-01 (×2): via INTRAVENOUS

## 2015-09-30 MED ORDER — BUDESONIDE-FORMOTEROL FUMARATE 80-4.5 MCG/ACT IN AERO
1.0000 | INHALATION_SPRAY | Freq: Two times a day (BID) | RESPIRATORY_TRACT | Status: DC
  Administered 2015-10-01 – 2015-10-04 (×6): 1 via RESPIRATORY_TRACT
  Filled 2015-09-30 (×2): qty 6.9

## 2015-09-30 MED ORDER — BUPIVACAINE HCL (PF) 0.5 % IJ SOLN
INTRAMUSCULAR | Status: AC
  Filled 2015-09-30: qty 30

## 2015-09-30 MED ORDER — EPHEDRINE SULFATE 50 MG/ML IJ SOLN
INTRAMUSCULAR | Status: DC | PRN
  Administered 2015-09-30: 10 mg via INTRAVENOUS

## 2015-09-30 MED ORDER — PROPOFOL 10 MG/ML IV BOLUS
INTRAVENOUS | Status: AC
  Filled 2015-09-30: qty 20

## 2015-09-30 MED ORDER — PHENOL 1.4 % MT LIQD: 1.0000 | OROMUCOSAL | Status: DC | PRN

## 2015-09-30 MED ORDER — BISACODYL 10 MG RE SUPP: 10.0000 mg | Freq: Every day | RECTAL | Status: DC | PRN

## 2015-09-30 MED ORDER — MIDAZOLAM HCL 2 MG/2ML IJ SOLN
2.0000 mg | Freq: Once | INTRAMUSCULAR | Status: AC
  Administered 2015-09-30: 2 mg via INTRAVENOUS

## 2015-09-30 MED ORDER — FENTANYL CITRATE (PF) 250 MCG/5ML IJ SOLN
INTRAMUSCULAR | Status: AC
  Filled 2015-09-30: qty 5

## 2015-09-30 MED ORDER — CEFAZOLIN SODIUM-DEXTROSE 2-3 GM-% IV SOLR
2.0000 g | Freq: Four times a day (QID) | INTRAVENOUS | Status: AC
  Administered 2015-09-30 (×2): 2 g via INTRAVENOUS
  Filled 2015-09-30 (×2): qty 50

## 2015-09-30 MED ORDER — ACETAMINOPHEN 650 MG RE SUPP: 650.0000 mg | Freq: Four times a day (QID) | RECTAL | Status: DC | PRN

## 2015-09-30 MED ORDER — CLONAZEPAM 1 MG PO TABS
1.0000 mg | ORAL_TABLET | Freq: Two times a day (BID) | ORAL | Status: DC | PRN
  Administered 2015-10-02: 1 mg via ORAL
  Filled 2015-09-30: qty 1

## 2015-09-30 MED ORDER — BUPIVACAINE-EPINEPHRINE (PF) 0.5% -1:200000 IJ SOLN
INTRAMUSCULAR | Status: AC
  Filled 2015-09-30: qty 30

## 2015-09-30 MED ORDER — ALBUTEROL SULFATE (2.5 MG/3ML) 0.083% IN NEBU: 2.5000 mg | INHALATION_SOLUTION | Freq: Four times a day (QID) | RESPIRATORY_TRACT | Status: DC | PRN

## 2015-09-30 MED ORDER — LIDOCAINE HCL (CARDIAC) 20 MG/ML IV SOLN
INTRAVENOUS | Status: AC
  Filled 2015-09-30: qty 5

## 2015-09-30 MED ORDER — ACETAMINOPHEN 325 MG PO TABS
650.0000 mg | ORAL_TABLET | Freq: Four times a day (QID) | ORAL | Status: DC | PRN
  Administered 2015-10-03 (×2): 650 mg via ORAL
  Filled 2015-09-30 (×2): qty 2

## 2015-09-30 MED ORDER — DIPHENHYDRAMINE HCL 12.5 MG/5ML PO ELIX
12.5000 mg | ORAL_SOLUTION | ORAL | Status: DC | PRN
  Administered 2015-10-03: 25 mg via ORAL
  Filled 2015-09-30: qty 10

## 2015-09-30 MED ORDER — LIDOCAINE HCL (CARDIAC) 20 MG/ML IV SOLN
INTRAVENOUS | Status: DC | PRN
  Administered 2015-09-30: 100 mg via INTRAVENOUS

## 2015-09-30 MED ORDER — FLEET ENEMA 7-19 GM/118ML RE ENEM: 1.0000 | ENEMA | Freq: Once | RECTAL | Status: DC | PRN

## 2015-09-30 MED ORDER — SODIUM CHLORIDE 0.9 % IR SOLN
Status: DC | PRN
  Administered 2015-09-30: 1000 mL

## 2015-09-30 MED ORDER — ONDANSETRON HCL 4 MG/2ML IJ SOLN
4.0000 mg | Freq: Four times a day (QID) | INTRAMUSCULAR | Status: DC | PRN
  Administered 2015-10-02: 4 mg via INTRAVENOUS
  Filled 2015-09-30: qty 2

## 2015-09-30 MED ORDER — METHOCARBAMOL 500 MG PO TABS
500.0000 mg | ORAL_TABLET | Freq: Three times a day (TID) | ORAL | Status: DC | PRN
  Administered 2015-10-01 – 2015-10-04 (×3): 500 mg via ORAL
  Filled 2015-09-30 (×3): qty 1

## 2015-09-30 MED ORDER — SIMVASTATIN 40 MG PO TABS
40.0000 mg | ORAL_TABLET | Freq: Every day | ORAL | Status: DC
  Administered 2015-09-30 – 2015-10-03 (×4): 40 mg via ORAL
  Filled 2015-09-30 (×4): qty 1

## 2015-09-30 MED ORDER — MIDAZOLAM HCL 5 MG/5ML IJ SOLN
INTRAMUSCULAR | Status: DC | PRN
  Administered 2015-09-30: 2 mg via INTRAVENOUS

## 2015-09-30 MED ORDER — MENTHOL 3 MG MT LOZG: 1.0000 | LOZENGE | OROMUCOSAL | Status: DC | PRN

## 2015-09-30 MED ORDER — FENTANYL CITRATE (PF) 100 MCG/2ML IJ SOLN
25.0000 ug | INTRAMUSCULAR | Status: DC | PRN
  Administered 2015-09-30: 25 ug via INTRAVENOUS
  Administered 2015-09-30: 50 ug via INTRAVENOUS
  Administered 2015-09-30: 25 ug via INTRAVENOUS
  Administered 2015-09-30: 50 ug via INTRAVENOUS

## 2015-09-30 MED ORDER — ONDANSETRON HCL 4 MG PO TABS: 4.0000 mg | ORAL_TABLET | Freq: Four times a day (QID) | ORAL | Status: DC | PRN

## 2015-09-30 MED ORDER — FENTANYL CITRATE (PF) 100 MCG/2ML IJ SOLN
INTRAMUSCULAR | Status: DC | PRN
  Administered 2015-09-30 (×5): 50 ug via INTRAVENOUS

## 2015-09-30 MED ORDER — INSULIN ASPART 100 UNIT/ML ~~LOC~~ SOLN
0.0000 [IU] | Freq: Three times a day (TID) | SUBCUTANEOUS | Status: DC
  Administered 2015-09-30: 3 [IU] via SUBCUTANEOUS
  Administered 2015-10-01 (×2): 2 [IU] via SUBCUTANEOUS
  Administered 2015-10-01 – 2015-10-02 (×2): 3 [IU] via SUBCUTANEOUS
  Administered 2015-10-02: 2 [IU] via SUBCUTANEOUS
  Administered 2015-10-02 – 2015-10-04 (×4): 3 [IU] via SUBCUTANEOUS

## 2015-09-30 MED ORDER — VALSARTAN-HYDROCHLOROTHIAZIDE 160-12.5 MG PO TABS: 1.0000 | ORAL_TABLET | Freq: Every day | ORAL | Status: DC

## 2015-09-30 MED ORDER — ALUM & MAG HYDROXIDE-SIMETH 200-200-20 MG/5ML PO SUSP: 30.0000 mL | ORAL | Status: DC | PRN

## 2015-09-30 MED ORDER — HYDROCODONE-ACETAMINOPHEN 5-325 MG PO TABS
ORAL_TABLET | ORAL | Status: AC
  Filled 2015-09-30: qty 2

## 2015-09-30 MED ORDER — CEFAZOLIN SODIUM-DEXTROSE 2-3 GM-% IV SOLR
2.0000 g | INTRAVENOUS | Status: AC
  Administered 2015-09-30: 2 g via INTRAVENOUS
  Filled 2015-09-30: qty 50

## 2015-09-30 SURGICAL SUPPLY — 66 items
BANDAGE ACE 4X5 VEL STRL LF (GAUZE/BANDAGES/DRESSINGS) ×3 IMPLANT
BANDAGE ACE 6X5 VEL STRL LF (GAUZE/BANDAGES/DRESSINGS) ×3 IMPLANT
BANDAGE ELASTIC 4 VELCRO ST LF (GAUZE/BANDAGES/DRESSINGS) IMPLANT
BANDAGE ELASTIC 6 VELCRO ST LF (GAUZE/BANDAGES/DRESSINGS) IMPLANT
BANDAGE ESMARK 6X9 LF (GAUZE/BANDAGES/DRESSINGS) ×1 IMPLANT
BLADE SAGITTAL 25.0X1.19X90 (BLADE) ×2 IMPLANT
BLADE SAGITTAL 25.0X1.19X90MM (BLADE) ×1
BLADE SAW SAG 90X13X1.27 (BLADE) ×3 IMPLANT
BNDG ESMARK 6X9 LF (GAUZE/BANDAGES/DRESSINGS) ×3
BONE CEMENT GENTAMICIN (Cement) ×6 IMPLANT
BOWL SMART MIX CTS (DISPOSABLE) ×3 IMPLANT
CAP KNEE TOTAL 3 SIGMA ×3 IMPLANT
CEMENT BONE GENTAMICIN 40 (Cement) ×2 IMPLANT
COVER SURGICAL LIGHT HANDLE (MISCELLANEOUS) ×3 IMPLANT
CUFF TOURNIQUET SINGLE 34IN LL (TOURNIQUET CUFF) ×3 IMPLANT
CUFF TOURNIQUET SINGLE 44IN (TOURNIQUET CUFF) IMPLANT
DRAPE INCISE IOBAN 66X45 STRL (DRAPES) IMPLANT
DRAPE ORTHO SPLIT 77X108 STRL (DRAPES) ×4
DRAPE SURG ORHT 6 SPLT 77X108 (DRAPES) ×2 IMPLANT
DRAPE U-SHAPE 47X51 STRL (DRAPES) ×3 IMPLANT
DRSG ADAPTIC 3X8 NADH LF (GAUZE/BANDAGES/DRESSINGS) ×3 IMPLANT
DRSG PAD ABDOMINAL 8X10 ST (GAUZE/BANDAGES/DRESSINGS) ×6 IMPLANT
DURAPREP 26ML APPLICATOR (WOUND CARE) ×3 IMPLANT
ELECT REM PT RETURN 9FT ADLT (ELECTROSURGICAL) ×3
ELECTRODE REM PT RTRN 9FT ADLT (ELECTROSURGICAL) ×1 IMPLANT
EVACUATOR 1/8 PVC DRAIN (DRAIN) IMPLANT
FACESHIELD WRAPAROUND (MASK) ×3 IMPLANT
FLOSEAL 10ML (HEMOSTASIS) IMPLANT
GAUZE SPONGE 4X4 12PLY STRL (GAUZE/BANDAGES/DRESSINGS) ×3 IMPLANT
GLOVE BIOGEL PI IND STRL 8 (GLOVE) ×4 IMPLANT
GLOVE BIOGEL PI INDICATOR 8 (GLOVE) ×8
GLOVE ORTHO TXT STRL SZ7.5 (GLOVE) ×3 IMPLANT
GLOVE SURG ORTHO 8.0 STRL STRW (GLOVE) ×3 IMPLANT
GOWN STRL REUS W/ TWL LRG LVL3 (GOWN DISPOSABLE) ×2 IMPLANT
GOWN STRL REUS W/ TWL XL LVL3 (GOWN DISPOSABLE) ×1 IMPLANT
GOWN STRL REUS W/TWL 2XL LVL3 (GOWN DISPOSABLE) ×3 IMPLANT
GOWN STRL REUS W/TWL LRG LVL3 (GOWN DISPOSABLE) ×4
GOWN STRL REUS W/TWL XL LVL3 (GOWN DISPOSABLE) ×2
HANDPIECE INTERPULSE COAX TIP (DISPOSABLE) ×2
HOOD PEEL AWAY FACE SHEILD DIS (HOOD) ×3 IMPLANT
IMMOBILIZER KNEE 22 UNIV (SOFTGOODS) ×3 IMPLANT
KIT BASIN OR (CUSTOM PROCEDURE TRAY) ×3 IMPLANT
KIT ROOM TURNOVER OR (KITS) ×3 IMPLANT
MANIFOLD NEPTUNE II (INSTRUMENTS) ×3 IMPLANT
NEEDLE 22X1 1/2 (OR ONLY) (NEEDLE) ×3 IMPLANT
NS IRRIG 1000ML POUR BTL (IV SOLUTION) ×3 IMPLANT
PACK TOTAL JOINT (CUSTOM PROCEDURE TRAY) ×3 IMPLANT
PACK UNIVERSAL I (CUSTOM PROCEDURE TRAY) ×3 IMPLANT
PAD ARMBOARD 7.5X6 YLW CONV (MISCELLANEOUS) ×6 IMPLANT
PAD CAST 4YDX4 CTTN HI CHSV (CAST SUPPLIES) ×1 IMPLANT
PADDING CAST COTTON 4X4 STRL (CAST SUPPLIES) ×2
PADDING CAST COTTON 6X4 STRL (CAST SUPPLIES) ×3 IMPLANT
SET HNDPC FAN SPRY TIP SCT (DISPOSABLE) ×1 IMPLANT
STAPLER VISISTAT 35W (STAPLE) ×3 IMPLANT
SUCTION FRAZIER HANDLE 10FR (MISCELLANEOUS) ×2
SUCTION TUBE FRAZIER 10FR DISP (MISCELLANEOUS) ×1 IMPLANT
SUT ETHIBOND NAB CT1 #1 30IN (SUTURE) ×9 IMPLANT
SUT VIC AB 0 CT1 27 (SUTURE) ×2
SUT VIC AB 0 CT1 27XBRD ANBCTR (SUTURE) ×1 IMPLANT
SUT VIC AB 2-0 CT1 27 (SUTURE) ×4
SUT VIC AB 2-0 CT1 TAPERPNT 27 (SUTURE) ×2 IMPLANT
SYR CONTROL 10ML LL (SYRINGE) IMPLANT
TOWEL OR 17X24 6PK STRL BLUE (TOWEL DISPOSABLE) ×3 IMPLANT
TOWEL OR 17X26 10 PK STRL BLUE (TOWEL DISPOSABLE) ×3 IMPLANT
TRAY FOLEY CATH 16FRSI W/METER (SET/KITS/TRAYS/PACK) IMPLANT
WATER STERILE IRR 1000ML POUR (IV SOLUTION) ×3 IMPLANT

## 2015-09-30 NOTE — H&P (View-Only) (Signed)
TOTAL KNEE ADMISSION H&P  Patient is being admitted for right total knee arthroplasty.  Subjective:  Chief Complaint:right knee pain.  HPI: [name] [name], 79 y.o. female, has a history of pain and functional disability in the right knee due to arthritis and has failed non-surgical conservative treatments for greater than 12 weeks to includeNSAID's and/or analgesics, corticosteriod injections, use of assistive devices and activity modification.  Onset of symptoms was gradual, starting 8 years ago with gradually worsening course since that time. The patient noted no past surgery on the right knee(s).  Patient currently rates pain in the right knee(s) at 10 out of 10 with activity. Patient has night pain, worsening of pain with activity and weight bearing, pain that interferes with activities of daily living, pain with passive range of motion, crepitus and joint swelling.  Patient has evidence of periarticular osteophytes and joint space narrowing by imaging studies.There is no active infection.  Patient Active Problem List   Diagnosis Date Noted  . Atypical chest pain   . Right knee pain 11/17/2014  . Osteoarthritis 09/16/2014  . Fall at home 09/16/2014  . Acute low back pain 08/30/2014  . Skin abnormality 07/30/2013  . Hypercalcemia 04/08/2013  . Lumbar spondylosis with myelopathy 06/24/2012  . Preventative health care 06/24/2012  . Cervical spondylosis 03/26/2012  . CHRONIC RHINITIS 06/02/2010  . Type 2 diabetes mellitus, controlled (HCC) 06/22/2009  . Hyperlipidemia 06/22/2009  . Essential hypertension 06/22/2009  . RESPIRATORY FAILURE, CHRONIC 06/07/2009  . GERD 06/07/2009  . Asthma 01/07/2008   Past Medical History  Diagnosis Date  . Asthma   . GERD (gastroesophageal reflux disease)   . Hypertension   . CAD (coronary artery disease)   . Obesity   . Diabetes mellitus   . Cervical spondylosis 03/26/2012  . Benign paroxysmal positional vertigo 11/05/2012  . CHF (congestive  heart failure) (HCC)   . Shortness of breath   . Anxiety     Past Surgical History  Procedure Laterality Date  . Cholecystectomy    . Appendectomy    . Abdominal hysterectomy    . Rotator cuff repair  2003    Right  . Lumbar laminectomy  2002  . Cesarean section    . Ear pins bilaterally       (Not in a hospital admission) Allergies  Allergen Reactions  . Contrast Media [Iodinated Diagnostic Agents] Shortness Of Breath and Rash    13 hour prep  . Sulfa Antibiotics Swelling  . Aspirin Other (See Comments)     heart palpitations  . Oxycodone-Acetaminophen Rash    Social History  Substance Use Topics  . Smoking status: Never Smoker   . Smokeless tobacco: Never Used  . Alcohol Use: No    Family History  Problem Relation Age of Onset  . Cancer Mother     Question pancreatic  . Coronary artery disease Father   . Breast cancer Sister 48  . Coronary artery disease Sister 49  . Colon cancer Neg Hx      Review of Systems  HENT: Positive for hearing loss.   Respiratory: Positive for shortness of breath.   Cardiovascular: Positive for chest pain.  Musculoskeletal: Positive for back pain and joint pain.  Neurological: Positive for dizziness.  All other systems reviewed and are negative.   Objective:  Physical Exam  Constitutional: She is oriented to person, place, and time. She appears well-developed and well-nourished. No distress.  HENT:  Head: Normocephalic and atraumatic.  Nose: Nose normal.    Pupils are equal, round, and reactive to light.  Neck: Normal range of motion. Neck supple.  Cardiovascular: Normal rate, regular rhythm, normal heart sounds and intact distal pulses.  Respiratory: Effort normal and breath sounds normal. No  respiratory distress. She has no wheezes.  GI: Soft. Bowel sounds are normal. She exhibits no distension. There is no tenderness.  Musculoskeletal:   Right knee: She exhibits swelling and bony tenderness. She exhibits normal range of motion, no erythema, no LCL laxity and no MCL laxity. Tenderness found.  Lymphadenopathy:   She has no cervical adenopathy.  Neurological: She is alert and oriented to person, place, and time. No cranial nerve deficit.  Skin: Skin is warm and dry. No rash noted. No erythema.  Psychiatric: She has a normal mood and affect. Her behavior is normal.    Vital signs in last 24 hours: @  Labs:   Estimated body mass index is 30.85 kg/(m^2) as calculated from the following:  Height as of 06/27/15:  (1.702 m).  Weight as of 06/27/15: 89.359 kg (197 lb).   Imaging Review Plain radiographs demonstrate moderate degenerative joint disease of the bilaterally knee(s). The overall alignment ismild varus. The bone quality appears to be good for age and reported activity level.  Assessment/Plan:  End stage arthritis, right knee   The patient history, physical examination, clinical judgment of the provider and imaging studies are consistent with end stage degenerative joint disease of the right knee(s) and total knee arthroplasty is deemed medically necessary. The treatment options including medical management, injection therapy arthroscopy and arthroplasty were discussed at length. The risks and benefits of total knee arthroplasty were presented and reviewed. The risks due to aseptic loosening, infection, stiffness, patella tracking problems, thromboembolic complications and other imponderables were discussed. The patient acknowledged the explanation, agreed to proceed with the plan and consent was signed. Patient is being admitted for inpatient treatment for surgery, pain control, PT, OT, prophylactic antibiotics, VTE prophylaxis, progressive ambulation  and ADL's and discharge planning. The patient is planning to be discharged home with home health services

## 2015-09-30 NOTE — Brief Op Note (Signed)
09/30/2015  1:12 PM  PATIENT:  [name] [name]  80 y.o. female  PRE-OPERATIVE DIAGNOSIS:  RT KNEE OA  POST-OPERATIVE DIAGNOSIS:  RT KNEE OA  PROCEDURE:  Procedure(s): RIGHT TOTAL KNEE ARTHROPLASTY (Right)  SURGEON:  Surgeon(s) and Role:    * Frederico Hamman, MD - Primary  PHYSICIAN ASSISTANT: Margart Sickles, PA-C  ASSISTANTS:   ANESTHESIA:   regional and general  EBL:  Total I/O In: 800 [I.V.:700; IV Piggyback:100] Out: -   BLOOD ADMINISTERED:none  DRAINS: none   LOCAL MEDICATIONS USED:  MARCAINE    and NONE  SPECIMEN:  No Specimen  DISPOSITION OF SPECIMEN:  N/A  COUNTS:  YES  TOURNIQUET:   Total Tourniquet Time Documented: Thigh (Right) - 56 minutes Total: Thigh (Right) - 56 minutes   DICTATION: .Other Dictation: Dictation Number unknown  PLAN OF CARE: Admit to inpatient   PATIENT DISPOSITION:  PACU - hemodynamically stable.   Delay start of Pharmacological VTE agent (>24hrs) due to surgical blood loss or risk of bleeding: yes

## 2015-09-30 NOTE — Anesthesia Procedure Notes (Addendum)
Procedure Name: LMA Insertion Date/Time: 09/30/2015 11:24 AM Performed by: Minerva Ends Pre-anesthesia Checklist: Patient identified, Emergency Drugs available, Suction available, Patient being monitored and Timeout performed Patient Re-evaluated:Patient Re-evaluated prior to inductionOxygen Delivery Method: Circle system utilized Preoxygenation: Pre-oxygenation with 100% oxygen Intubation Type: IV induction Ventilation: Mask ventilation without difficulty LMA: LMA with gastric port inserted LMA Size: 4.0 Placement Confirmation: positive ETCO2 and breath sounds checked- equal and bilateral Tube secured with: Tape   Anesthesia Regional Block:  Femoral nerve block  Pre-Anesthetic Checklist: ,, timeout performed, Correct Patient, Correct Site, Correct Laterality, Correct Procedure, Correct Position, site marked, Risks and benefits discussed,  Surgical consent,  Pre-op evaluation,  At surgeon's request and post-op pain management  Laterality: Right  Prep: chloraprep       Needles:   Needle Type: Stimulator Needle - 80          Additional Needles:  Procedures: Doppler guided, ultrasound guided (picture in chart) and nerve stimulator Femoral nerve block  Nerve Stimulator or Paresthesia:  Response: 0.5 mA,   Additional Responses:   Narrative:  Start time: 09/30/2015 10:10 AM End time: 09/30/2015 10:25 AM Injection made incrementally with aspirations every 5 mL.  Performed by: Personally  Anesthesiologist: Judie Petit

## 2015-09-30 NOTE — Interval H&P Note (Signed)
History and Physical Interval Note:  09/30/2015 7:25 AM  [name] [name]  has presented today for surgery, with the diagnosis of RT KNEE OA  The various methods of treatment have been discussed with the patient and family. After consideration of risks, benefits and other options for treatment, the patient has consented to  Procedure(s): RIGHT TOTAL KNEE ARTHROPLASTY (Right) as a surgical intervention .  The patient's history has been reviewed, patient examined, no change in status, stable for surgery.  I have reviewed the patient's chart and labs.  Questions were answered to the patient's satisfaction.     Shayla Heming JR,W D

## 2015-09-30 NOTE — Discharge Instructions (Signed)

## 2015-09-30 NOTE — Progress Notes (Signed)
Orthopedic Tech Progress Note Patient Details:  [name] [name] 06-02-1936 [id number]  CPM Right Knee CPM Right Knee: On Right Knee Flexion (Degrees): 90 Right Knee Extension (Degrees): 0 Additional Comments: Trapeze bar and foot roll   Saul Fordyce 09/30/2015, 1:44 PM

## 2015-09-30 NOTE — Anesthesia Preprocedure Evaluation (Signed)
Anesthesia Evaluation  Patient identified by MRN, date of birth, ID band Patient awake    History of Anesthesia Complications (+) DIFFICULT AIRWAY  Airway Mallampati: II  TM Distance: >3 FB Neck ROM: Full    Dental   Pulmonary shortness of breath, asthma ,    breath sounds clear to auscultation       Cardiovascular hypertension, + CAD and +CHF   Rhythm:Regular Rate:Normal     Neuro/Psych    GI/Hepatic GERD  ,  Endo/Other  diabetes  Renal/GU      Musculoskeletal   Abdominal   Peds  Hematology   Anesthesia Other Findings   Reproductive/Obstetrics                             Anesthesia Physical Anesthesia Plan  ASA: III  Anesthesia Plan: General   Post-op Pain Management: GA combined w/ Regional for post-op pain   Induction: Intravenous  Airway Management Planned: LMA  Additional Equipment:   Intra-op Plan:   Post-operative Plan: Extubation in OR  Informed Consent: I have reviewed the patients History and Physical, chart, labs and discussed the procedure including the risks, benefits and alternatives for the proposed anesthesia with the patient or authorized representative who has indicated his/her understanding and acceptance.   Dental advisory given  Plan Discussed with: CRNA and Anesthesiologist  Anesthesia Plan Comments:         Anesthesia Quick Evaluation

## 2015-09-30 NOTE — Op Note (Signed)
NAME:  [name] [name], CLUCAS NO.:  [id number]  MEDICAL RECORD NO.:  [id number]  LOCATION:  [facility]                         FACILITY:  [facility]  PHYSICIAN:  Dyke Brackett, M.D.    DATE OF BIRTH:  26-Mar-1936  DATE OF PROCEDURE:  09/30/2015 DATE OF DISCHARGE:                              OPERATIVE REPORT   PREOPERATIVE DIAGNOSIS:  Severe osteoarthritis, right knee and left knee.  POSTOPERATIVE DIAGNOSIS:  Severe osteoarthritis, right knee and left knee.  OPERATION: 1. Right total knee replacement (Sigma size 4 narrow femur, 4 tibia,     12.5 mm bearing, and 38 mm all poly patella). 2. Intra-articular injection, left knee, corticosteroid and Marcaine.  SURGEON:  Dyke Brackett, M.D.  ASSISTANT:  Margart Sickles, PA-C  ANESTHESIA:  General with a nerve block.  DESCRIPTION OF PROCEDURE:  After general anesthetic, we injected 80 mg Depo-Medrol and 4-5 mL 0.5% plain Marcaine into the left knee intra- articularly.  We then prepped the right knee in standard fashion, with the tourniquet in supine position.  Exsanguination placed into 350. Tourniquet was approximately 50 minutes.  Straight skin incision was made with a medial parapatellar approach to the knee made.  We cut 11 mm 5-degree valgus cut on the femur.  Followed by cutting about 4 mm below the most diseased medial compartment.  There is some moderate stripping of the medial side of the knee to the varus deformity.  Extension gap eventually measured at 12.5 mm.  Size the 4, 4 narrow prosthesis and then cut after sizing placed in all in 1 cutting block of the appropriate degree of external rotation and then placed the anterior posterior chamfer cuts without difficulty.  PCL was released.  Excess menisci were removed in the posterior aspect of the knee, stripping the posterior aspect of the knee was done as well.  The keel was cut for a size 4 tibia, which covered the tibia nicely.  Trial base plate was placed with a  box cut, then made on the femur and a trial femur was placed as well.  Again there is moderate hyperextension with a 10, we thought the 12.5 mm bearing balance the ligamentous structures nicely with full extension, good stability in flexion, and no tendency for bearing spin out.  Good balance in the collateral ligaments.  Patella was cut leaving about 15-16 mm of native patella for a 38 mm all poly trial.  All trials were placed.  There was again full extension.  Good stability and good balance in flexion.  We removed the components.  We inserted antibiotic impregnated cement in a doughy state, tibia followed by femur.  Patella like to use a trial bearing while the cement hardened.  Trial bearing was removed.  Small bits of the cement were removed from the posterior aspect of the knee. Then released the tourniquet without the bearing being in.  No excessive bleeding was noted.  Small bleeders were coagulated. The final bearing was placed.  We then closed with #1 Ethibond 2-0 Vicryl and skin clips, lightly compressive sterile dressing the knee immobilizer was applied.  Taken to the recovery room in stable condition.     Dyke Brackett, M.D.  WDC/MEDQ  D:  09/30/2015  T:  09/30/2015  Job:  [id number]

## 2015-09-30 NOTE — Progress Notes (Signed)
Utilization review completed.  

## 2015-10-01 LAB — GLUCOSE, CAPILLARY
Glucose-Capillary: 145 mg/dL — ABNORMAL HIGH (ref 65–99)
Glucose-Capillary: 153 mg/dL — ABNORMAL HIGH (ref 65–99)
Glucose-Capillary: 122 mg/dL — ABNORMAL HIGH (ref 65–99)
Glucose-Capillary: 167 mg/dL — ABNORMAL HIGH (ref 65–99)

## 2015-10-01 LAB — BASIC METABOLIC PANEL
ANION GAP: 8 (ref 5–15)
BUN: 12 mg/dL (ref 6–20)
CHLORIDE: 100 mmol/L — AB (ref 101–111)
CO2: 29 mmol/L (ref 22–32)
Calcium: 9 mg/dL (ref 8.9–10.3)
Creatinine, Ser: 0.92 mg/dL (ref 0.44–1.00)
GFR calc Af Amer: >60 mL/min (ref >60)
GFR calc non Af Amer: 59 mL/min — ABNORMAL LOW (ref >60)
GLUCOSE: 179 mg/dL — AB (ref 65–99)
POTASSIUM: 3.9 mmol/L (ref 3.5–5.1)
Sodium: 137 mmol/L (ref 135–145)

## 2015-10-01 LAB — CBC
HEMATOCRIT: 29.1 % — AB (ref 36.0–46.0)
Hemoglobin: 9.1 g/dL — ABNORMAL LOW (ref 12.0–15.0)
MCH: 27.9 pg (ref 26.0–34.0)
MCHC: 31.3 g/dL (ref 30.0–36.0)
MCV: 89.3 fL (ref 78.0–100.0)
Platelets: 319 10*3/uL (ref 150–400)
RBC: 3.26 MIL/uL — AB (ref 3.87–5.11)
RDW: 15.3 % (ref 11.5–15.5)
WBC: 9.6 10*3/uL (ref 4.0–10.5)

## 2015-10-01 NOTE — Evaluation (Signed)
Occupational Therapy Evaluation Patient Details Name: [name] [name] MRN: [id number] DOB: 1935-09-05 Today's Date: 10/01/2015    History of Present Illness 80 y.o. female s/p R TKA. PMH includes asthma, GERD, HTN, CAD, obesity, diabetes mellitus, cervical spondylosis, CHF, SOB, anxiety, lumbar laminectomy, rotator cuff repair, benign paroxysmal positional vertigo.   Clinical Impression   Pt s/p above. Feel pt will benefit from acute OT to increase independence prior to d/c. Recommending HHOT at this time.     Follow Up Recommendations  Home health OT;Supervision/Assistance - 24 hour    Equipment Recommendations  Other (comment) (AE)    Recommendations for Other Services       Precautions / Restrictions Precautions Precautions: Knee;Fall Precaution Booklet Issued: No Precaution Comments: educated on knee precautions Required Braces or Orthoses: Knee Immobilizer - Right Knee Immobilizer - Right:  (when walking until block wears off) Restrictions Weight Bearing Restrictions: Yes RLE Weight Bearing: Weight bearing as tolerated      Mobility Bed Mobility Overal bed mobility: Needs Assistance Bed Mobility: Sit to Supine     Sit to supine: Mod assist   General bed mobility comments: assist with LEs. Trendelenburg position used to scoot HOB and cues given for technique.  Transfers Overall transfer level: Needs assistance Equipment used: Rolling walker (2 wheeled) Transfers: Sit to/from [employer] Sit to Stand: Mod assist Stand pivot transfers: Mod assist       General transfer comment: cues given. assist to boost to standing.    Balance    RW used for stand pivot transfer and for sit to stand transfer.                                        ADL Overall ADL's : Needs assistance/impaired                     Lower Body Dressing: Sit to/from stand;Moderate assistance   Toilet Transfer: Moderate  assistance;Stand-pivot;RW (Mod assist-sit to stand and Mod assist-stand pivot)           Functional mobility during ADLs: Rolling walker;Moderate assistance General ADL Comments: Educated on LB dressing technique. Educated on purpose of bone foam. Educated on benefit of reaching down to Rt foot for LB dressing as it allows knee to bend.     Vision     Perception     Praxis      Pertinent Vitals/Pain Pain Assessment: 0-10 Pain Score: 7  Pain Location: right knee Pain Descriptors / Indicators: Sore;Grimacing;Guarding Pain Intervention(s): Monitored during session;Repositioned     Hand Dominance     Extremity/Trunk Assessment Upper Extremity Assessment Upper Extremity Assessment: Generalized weakness (history of rotator cuff repair)   Lower Extremity Assessment Lower Extremity Assessment: Defer to PT evaluation RLE Deficits / Details: 3-45 degrees R knee in supine       Communication Communication Communication: No difficulties   Cognition Arousal/Alertness: Lethargic;Suspect due to medications Behavior During Therapy: [facility] for tasks assessed/performed Overall Cognitive Status: Within Functional Limits for tasks assessed                     General Comments       Exercises       Shoulder Instructions      Home Living Family/patient expects to be discharged to:: Private residence Living Arrangements: Spouse/significant other Available Help at Discharge: Family;Available 24 hours/day Type of Home:  House Home Access: Stairs to enter [employer] of Steps: 3, handrail in the middle   Home Layout: One level     Bathroom Shower/Tub: Producer, television/film/video: Standard     Home Equipment: Environmental consultant - 2 wheels;Cane - single point;Bedside commode          Prior Functioning/Environment Level of Independence: Needs assistance        Comments: Pt needed her husband's assistance on stairs.    OT Diagnosis: Acute pain;Generalized  weakness   OT Problem List: Decreased strength;Decreased range of motion;Decreased activity tolerance;Impaired balance (sitting and/or standing);Decreased knowledge of use of DME or AE;Decreased knowledge of precautions;Pain;Obesity   OT Treatment/Interventions: Self-care/ADL training;DME and/or AE instruction;Therapeutic activities;Patient/family education;Balance training    OT Goals(Current goals can be found in the care plan section) Acute Rehab OT Goals Patient Stated Goal: not stated OT Goal Formulation: With patient Time For Goal Achievement: 10/08/15 Potential to Achieve Goals: Good ADL Goals Pt Will Perform Lower Body Dressing: with adaptive equipment;sit to/from stand;with min guard assist Pt Will Transfer to Toilet: ambulating;with min assist (3 in 1 over commode) Pt Will Perform Toileting - Clothing Manipulation and hygiene: with min guard assist;sit to/from stand  OT Frequency: Min 2X/week   Barriers to D/C:            Co-evaluation              End of Session Equipment Utilized During Treatment: Gait belt;Rolling walker;Right knee immobilizer CPM Right Knee CPM Right Knee: Off  Activity Tolerance: Patient tolerated treatment well Patient left: in bed;with family/visitor present   Time: 2536-6440 OT Time Calculation (min): 18 min Charges:  OT General Charges $OT Visit: 1 Procedure OT Evaluation $OT Eval Moderate Complexity: 1 Procedure G-CodesEarlie Raveling OTR/L [phone] 10/01/2015, 3:43 PM

## 2015-10-01 NOTE — Evaluation (Signed)
Physical Therapy Evaluation Patient Details Name: [name] [name] MRN: [id number] DOB: 06-10-1936 Today's Date: 10/01/2015   History of Present Illness  80 y.o. female s/p R TKA.  Clinical Impression  Pt is s/p TKA resulting in the deficits listed below (see PT Problem List). On eval, pt required mod assist for bed mobility and transfers using RW. Unable to progress with ambulation due to pain with WB RLE. Pt will benefit from skilled PT to increase their independence and safety with mobility to allow discharge to the venue listed below. Pt's desire is to d/c home with HHPT. She has good family support.      Follow Up Recommendations Home health PT;Supervision/Assistance - 24 hour    Equipment Recommendations  3in1 (PT)    Recommendations for Other Services       Precautions / Restrictions Precautions Precautions: Knee;Fall Required Braces or Orthoses: Knee Immobilizer - Right Knee Immobilizer - Right: On when out of bed or walking Restrictions RLE Weight Bearing: Weight bearing as tolerated      Mobility  Bed Mobility Overal bed mobility: Needs Assistance Bed Mobility: Supine to Sit     Supine to sit: Mod assist;HOB elevated     General bed mobility comments: use of bed rails, verbal cues for sequencing  Transfers Overall transfer level: Needs assistance Equipment used: Rolling walker (2 wheeled) Transfers: Sit to/from [employer] Sit to Stand: Mod assist Stand pivot transfers: Mod assist       General transfer comment: verbal cues for sequencing and upright posture, pivot transfer performed toward left to [facility]. Pt then stood with RW and BSC switched out with recliner.  Ambulation/Gait                Stairs            Wheelchair Mobility    Modified Rankin (Stroke Patients Only)       Balance                                             Pertinent Vitals/Pain Pain Assessment: 0-10 Pain Score: 5  Pain  Location: R knee Pain Descriptors / Indicators: Sore;Grimacing;Guarding Pain Intervention(s): Limited activity within patient's tolerance;Monitored during session;Premedicated before session;Repositioned    Home Living Family/patient expects to be discharged to:: Private residence Living Arrangements: Spouse/significant other Available Help at Discharge: Family;Available 24 hours/day Type of Home: House Home Access: Stairs to enter   [employer] of Steps: 3, handrail in the middle Home Layout: One level Home Equipment: Walker - 2 wheels;Cane - single point      Prior Function Level of Independence: Independent with assistive device(s)         Comments: Pt needed her husband's assistance on stairs.     Hand Dominance        Extremity/Trunk Assessment   Upper Extremity Assessment: Defer to OT evaluation           Lower Extremity Assessment: RLE deficits/detail RLE Deficits / Details: 3-45 degrees R knee in supine       Communication   Communication: No difficulties  Cognition Arousal/Alertness: Awake/alert Behavior During Therapy: WFL for tasks assessed/performed Overall Cognitive Status: Within Functional Limits for tasks assessed                      General Comments      Exercises Total  Joint Exercises Ankle Circles/Pumps: AROM;Both;10 reps;Supine Quad Sets: AAROM;Right;5 reps;Supine Goniometric ROM: 3-45 degrees R knee in supine      Assessment/Plan    PT Assessment Patient needs continued PT services  PT Diagnosis Difficulty walking;Generalized weakness;Acute pain   PT Problem List Decreased strength;Decreased range of motion;Decreased activity tolerance;Decreased balance;Decreased mobility;Pain;Decreased safety awareness;Decreased knowledge of use of DME;Decreased knowledge of precautions  PT Treatment Interventions DME instruction;Gait training;Stair training;Functional mobility training;Therapeutic activities;Therapeutic  exercise;Patient/family education;Balance training   PT Goals (Current goals can be found in the Care Plan section) Acute Rehab PT Goals Patient Stated Goal: home with home health PT Goal Formulation: With patient Time For Goal Achievement: 10/15/15 Potential to Achieve Goals: Good    Frequency 7X/week   Barriers to discharge        Co-evaluation               End of Session Equipment Utilized During Treatment: Gait belt;Right knee immobilizer Activity Tolerance: Patient limited by pain Patient left: in chair;with call bell/phone within reach;with family/visitor present Nurse Communication: Mobility status         Time: 1210-1250 PT Time Calculation (min) (ACUTE ONLY): 40 min   Charges:   PT Evaluation $PT Eval Moderate Complexity: 1 Procedure PT Treatments $Therapeutic Activity: 23-37 mins   PT G Codes:        Ilda Foil 10/01/2015, 2:26 PM

## 2015-10-01 NOTE — Progress Notes (Signed)
Orthopaedic Trauma Service (OTS)   Subjective: Patient reports pain as moderate.  In CPM.  Objective: Temp:  [97.2 F (36.2 C)-98.4 F (36.9 C)] 98.1 F (36.7 C) (02/04 0519) Pulse Rate:  [83-94] 88 (02/04 0519) Resp:  [10-20] 16 (02/04 0519) BP: (122-154)/(57-76) 122/57 mmHg (02/04 0519) SpO2:  [93 %-100 %] 93 % (02/04 0919) Physical Exam RLE Dressing intact, clean, dry  Edema/ swelling controlled  Sens: DPN, SPN, TN intact  Motor: EHL, FHL, and lessor toe ext and flex all intact grossly  Brisk cap refill, warm to touch  Assessment/Plan: 1 Day Post-Op Procedure(s) (LRB): RIGHT TOTAL KNEE ARTHROPLASTY (Right) 1. PT w WBAT 2. Lovenox 3. D/c tomorrow or Monday  Myrene Galas, MD [facility], [facility] [phone] (p)

## 2015-10-01 NOTE — Transfer of Care (Signed)
Immediate Anesthesia Transfer of Care Note  Patient: [name] [name]  Procedure(s) Performed: Procedure(s): RIGHT TOTAL KNEE ARTHROPLASTY (Right)  Patient Location: PACU  Anesthesia Type:General  Level of Consciousness: awake, alert , oriented and sedated  Airway & Oxygen Therapy: Patient Spontanous Breathing and Patient connected to nasal cannula oxygen  Post-op Assessment: Report given to RN, Post -op Vital signs reviewed and stable and Patient moving all extremities  Post vital signs: Reviewed and stable  Last Vitals:  Filed Vitals:   10/01/15 0003 10/01/15 0519  BP: 149/73 122/57  Pulse: 94 88  Temp: 36.7 C 36.7 C  Resp: 16 16    Complications: No apparent anesthesia complications

## 2015-10-02 LAB — CBC
HCT: 27.2 % — ABNORMAL LOW (ref 36.0–46.0)
Hemoglobin: 8.6 g/dL — ABNORMAL LOW (ref 12.0–15.0)
MCH: 28 pg (ref 26.0–34.0)
MCHC: 31.6 g/dL (ref 30.0–36.0)
MCV: 88.6 fL (ref 78.0–100.0)
PLATELETS: 272 10*3/uL (ref 150–400)
RBC: 3.07 MIL/uL — ABNORMAL LOW (ref 3.87–5.11)
RDW: 15.3 % (ref 11.5–15.5)
WBC: 11 10*3/uL — AB (ref 4.0–10.5)

## 2015-10-02 LAB — COMPREHENSIVE METABOLIC PANEL
ALBUMIN: 2.8 g/dL — AB (ref 3.5–5.0)
ALT: 10 U/L — ABNORMAL LOW (ref 14–54)
ANION GAP: 6 (ref 5–15)
AST: 14 U/L — ABNORMAL LOW (ref 15–41)
Alkaline Phosphatase: 59 U/L (ref 38–126)
BILIRUBIN TOTAL: 0.8 mg/dL (ref 0.3–1.2)
BUN: 13 mg/dL (ref 6–20)
CHLORIDE: 102 mmol/L (ref 101–111)
CO2: 31 mmol/L (ref 22–32)
Calcium: 9.2 mg/dL (ref 8.9–10.3)
Creatinine, Ser: 1.03 mg/dL — ABNORMAL HIGH (ref 0.44–1.00)
GFR calc Af Amer: 59 mL/min — ABNORMAL LOW (ref >60)
GFR calc non Af Amer: 51 mL/min — ABNORMAL LOW (ref >60)
GLUCOSE: 177 mg/dL — AB (ref 65–99)
POTASSIUM: 4 mmol/L (ref 3.5–5.1)
SODIUM: 139 mmol/L (ref 135–145)
TOTAL PROTEIN: 6.2 g/dL — AB (ref 6.5–8.1)

## 2015-10-02 LAB — GLUCOSE, CAPILLARY
GLUCOSE-CAPILLARY: 160 mg/dL — AB (ref 65–99)
GLUCOSE-CAPILLARY: 119 mg/dL — AB (ref 65–99)
Glucose-Capillary: 152 mg/dL — ABNORMAL HIGH (ref 65–99)
Glucose-Capillary: 125 mg/dL — ABNORMAL HIGH (ref 65–99)

## 2015-10-02 NOTE — Progress Notes (Signed)
Physical Therapy Treatment Patient Details Name: [name] [name] MRN: [id number] DOB: 1936-06-16 Today's Date: 10/02/2015    History of Present Illness 80 y.o. female s/p R TKA. PMH includes asthma, GERD, HTN, CAD, obesity, diabetes mellitus, cervical spondylosis, CHF, SOB, anxiety, lumbar laminectomy, rotator cuff repair, benign paroxysmal positional vertigo.    PT Comments    Increased gait distance noted today. Pt continues to require mod assist with bed mobility and transfers.  Follow Up Recommendations  Home health PT;Supervision/Assistance - 24 hour     Equipment Recommendations  3in1 (PT)    Recommendations for Other Services       Precautions / Restrictions Precautions Precautions: Knee;Fall Precaution Booklet Issued: No Precaution Comments: educated on knee precautions Required Braces or Orthoses: Knee Immobilizer - Right Knee Immobilizer - Right:  (when walking until block wears off) Restrictions Weight Bearing Restrictions: Yes RLE Weight Bearing: Weight bearing as tolerated    Mobility  Bed Mobility         Supine to sit: Mod assist;HOB elevated     General bed mobility comments: verbal cues for sequencing, heavy use of bed rail  Transfers Overall transfer level: Needs assistance Equipment used: Rolling walker (2 wheeled) Transfers: Sit to/from Stand Sit to Stand: Mod assist         General transfer comment: verbal cues for hand placement and sequencing  Ambulation/Gait Ambulation/Gait assistance: +2 safety/equipment;Min assist Ambulation Distance (Feet): 10 Feet Assistive device: Rolling walker (2 wheeled) Gait Pattern/deviations: Step-to pattern;Antalgic;Decreased stance time - right Gait velocity: decreased   General Gait Details: initial assist to progress RLE, verbal cues for sequencing   Stairs            Wheelchair Mobility    Modified Rankin (Stroke Patients Only)       Balance                                     Cognition Arousal/Alertness: Awake/alert Behavior During Therapy: WFL for tasks assessed/performed Overall Cognitive Status: Within Functional Limits for tasks assessed                      Exercises      General Comments        Pertinent Vitals/Pain Pain Assessment: 0-10 Pain Score: 7  Pain Location: RLE Pain Descriptors / Indicators: Grimacing Pain Intervention(s): Monitored during session;Repositioned    Home Living                      Prior Function            PT Goals (current goals can now be found in the care plan section) Acute Rehab PT Goals Patient Stated Goal: home PT Goal Formulation: With patient Time For Goal Achievement: 10/15/15 Potential to Achieve Goals: Good Progress towards PT goals: Progressing toward goals    Frequency  7X/week    PT Plan Current plan remains appropriate    Co-evaluation             End of Session Equipment Utilized During Treatment: Gait belt;Right knee immobilizer Activity Tolerance: Patient tolerated treatment well Patient left: in chair;with family/visitor present;with call bell/phone within reach     Time: 4782-9562 PT Time Calculation (min) (ACUTE ONLY): 17 min  Charges:  $Gait Training: 8-22 mins  G Codes:      Ilda Foil 10/02/2015, 10:55 AM

## 2015-10-02 NOTE — Progress Notes (Signed)
Occupational Therapy Treatment Patient Details Name: [name] [name] MRN: [id number] DOB: 1935-10-10 Today's Date: 10/02/2015    History of present illness 80 y.o. female s/p R TKA. PMH includes asthma, GERD, HTN, CAD, obesity, diabetes mellitus, cervical spondylosis, CHF, SOB, anxiety, lumbar laminectomy, rotator cuff repair, benign paroxysmal positional vertigo.   OT comments  Pt progressing. Education provided in session. Continue to recommend HHOT.  Follow Up Recommendations  Home health OT;Supervision/Assistance - 24 hour    Equipment Recommendations  Other (comment) (AE)    Recommendations for Other Services      Precautions / Restrictions Precautions Precautions: Knee;Fall Precaution Booklet Issued: No Precaution Comments: educated on knee precautions Required Braces or Orthoses: Knee Immobilizer - Right Knee Immobilizer - Right:  (when walking until block wears off) Restrictions Weight Bearing Restrictions: Yes RLE Weight Bearing: Weight bearing as tolerated       Mobility Bed Mobility               General bed mobility comments: not assessed  Transfers Overall transfer level: Needs assistance Equipment used: Rolling walker (2 wheeled) Transfers: Sit to/from Stand Sit to Stand: Mod assist          General transfer comment: assist to boost to stand and assist given for stand to sit to chair.  Cues given.    Balance    Used RW for ambulation-Min guard (and +2 to push chair behind)                               ADL Overall ADL's : Needs assistance/impaired                     Lower Body Dressing: Minimal assistance;Sitting/lateral leans;With adaptive equipment (for donning/doffing sock) Lower Body Dressing Details (indicate cue type and reason): donning/doffing sock; OT assisted did most of donning knee immobilizer Toilet Transfer: Moderate assistance;Ambulation;RW (Min guard and +2 for safety for ambulation)            Functional mobility during ADLs: Min guard;Rolling walker;+2 for safety/equipment (for ambulation) General ADL Comments: Educated on LB dressing technique. Pt tried to don right sock without AE, but could not do so. Pt practiced with reacher and sockaid. Pt able to ambulate today in room. Explained to spouse that bar in knee immobilizer goes in the back. Pt reports spouse was helping her with LB dressing, PTA.  Educated on AE.      Vision                     Perception     Praxis      Cognition  Awake/Alert Behavior During Therapy: WFL for tasks assessed/performed Overall Cognitive Status: Within Functional Limits for tasks assessed                       Extremity/Trunk Assessment               Exercises  Educated on chair push ups.   Shoulder Instructions       General Comments      Pertinent Vitals/ Pain       Pain Assessment: 0-10 Pain Score: 7  Pain Location: RLE Pain Descriptors / Indicators: Grimacing Pain Intervention(s): Monitored during session;Repositioned  Home Living  Prior Functioning/Environment              Frequency Min 2X/week     Progress Toward Goals  OT Goals(current goals can now be found in the care plan section)  Progress towards OT goals: Progressing toward goals  Acute Rehab OT Goals Patient Stated Goal: not stated OT Goal Formulation: With patient Time For Goal Achievement: 10/08/15 Potential to Achieve Goals: Good ADL Goals Pt Will Perform Lower Body Dressing: with adaptive equipment;sit to/from stand;with min guard assist Pt Will Transfer to Toilet: ambulating;with min assist (3 in 1 over commode) Pt Will Perform Toileting - Clothing Manipulation and hygiene: with min guard assist;sit to/from stand  Plan Discharge plan remains appropriate    Co-evaluation                 End of Session Equipment Utilized During Treatment: Gait  belt;Rolling walker;Right knee immobilizer;Other (comment) (AE) CPM Right Knee CPM Right Knee: Off   Activity Tolerance Patient limited by fatigue   Patient Left in chair;with call bell/phone within reach;with family/visitor present   Nurse Communication          Time: 1610-9604 OT Time Calculation (min): 18 min  Charges: OT General Charges $OT Visit: 1 Procedure OT Treatments $Self Care/Home Management : 8-22 mins  Earlie Raveling OTR/L [phone] 10/02/2015, 10:35 AM

## 2015-10-02 NOTE — Progress Notes (Signed)
Physical Therapy Treatment Patient Details Name: [name] [name] MRN: [id number] DOB: April 14, 1936 Today's Date: 10/02/2015    History of Present Illness 80 y.o. female s/p R TKA. PMH includes asthma, GERD, HTN, CAD, obesity, diabetes mellitus, cervical spondylosis, CHF, SOB, anxiety, lumbar laminectomy, rotator cuff repair, benign paroxysmal positional vertigo.    PT Comments    Pt reports having just gotten in bed. She is declining ambulation this afternoon session. Treatment focused on there ex and education on HEP.  Follow Up Recommendations  Home health PT;Supervision/Assistance - 24 hour     Equipment Recommendations  3in1 (PT)    Recommendations for Other Services       Precautions / Restrictions Precautions Precautions: Knee;Fall Precaution Comments: educated on knee precautions Required Braces or Orthoses: Knee Immobilizer - Right Restrictions RLE Weight Bearing: Weight bearing as tolerated    Mobility  Bed Mobility         Supine to sit: Mod assist;HOB elevated     General bed mobility comments: verbal cues for sequencing, heavy use of bed rail  Transfers   Equipment used: Rolling walker (2 wheeled)   Sit to Stand: Mod assist         General transfer comment: verbal cues for hand placement and sequencing  Ambulation/Gait Ambulation/Gait assistance: +2 safety/equipment;Min assist Ambulation Distance (Feet): 10 Feet Assistive device: Rolling walker (2 wheeled) Gait Pattern/deviations: Step-to pattern;Antalgic;Decreased stance time - right Gait velocity: decreased   General Gait Details: initial assist to progress RLE, verbal cues for sequencing   Stairs            Wheelchair Mobility    Modified Rankin (Stroke Patients Only)       Balance                                    Cognition Arousal/Alertness: Awake/alert Behavior During Therapy: WFL for tasks assessed/performed Overall Cognitive Status: Within Functional  Limits for tasks assessed                      Exercises Total Joint Exercises Ankle Circles/Pumps: AROM;Both;10 reps;Supine Quad Sets: AROM;Right;10 reps;Supine Short Arc Quad: AAROM;Right;10 reps;Supine Heel Slides: AAROM;Right;10 reps;Supine Hip ABduction/ADduction: AAROM;Right;10 reps;Supine Goniometric ROM: 0-50 R knee in supine    General Comments        Pertinent Vitals/Pain      Home Living                      Prior Function            PT Goals (current goals can now be found in the care plan section) Acute Rehab PT Goals Patient Stated Goal: home PT Goal Formulation: With patient Time For Goal Achievement: 10/15/15 Potential to Achieve Goals: Good Progress towards PT goals: Progressing toward goals    Frequency  7X/week    PT Plan Current plan remains appropriate    Co-evaluation             End of Session Equipment Utilized During Treatment: Gait belt;Right knee immobilizer Activity Tolerance: Patient tolerated treatment well Patient left: in chair;in CPM;with call bell/phone within reach;with family/visitor present     Time: 1914-7829 PT Time Calculation (min) (ACUTE ONLY): 20 min  Charges:  $Gait Training: 8-22 mins $Therapeutic Exercise: 8-22 mins  G Codes:      Ilda Foil 10/02/2015, 2:44 PM

## 2015-10-02 NOTE — Progress Notes (Signed)
[facility] Progress Note  Subjective  Doing well No specific complaints Still requiring IV pain meds for pain control No CP or SOB No abd pain  No N/V  ROS As above  Objective   BP 130/50 mmHg  Pulse 88  Temp(Src) 98.4 F (36.9 C) (Oral)  Resp 18  Ht _0  (1.676 m)  Wt 89.812 kg (198 lb)  BMI 31.97 kg/m2  SpO2 95%  Intake/Output      02/04 0701 - 02/05 0700 02/05 0701 - 02/06 0700   P.O. 480    I.V. (mL/kg) 300 (3.3)    IV Piggyback     Total Intake(mL/kg) 780 (8.7)    Urine (mL/kg/hr) 400 (0.2)    Total Output 400     Net +380          Urine Occurrence 9 x      Labs Results for [name], [name] [name] (MRN [id number]) as of 10/02/2015 10:05  Ref. Range 10/02/2015 03:30  Sodium Latest Ref Range: 135-145 mmol/L 139  Potassium Latest Ref Range: 3.5-5.1 mmol/L 4.0  Chloride Latest Ref Range: 101-111 mmol/L 102  CO2 Latest Ref Range: 22-32 mmol/L 31  BUN Latest Ref Range: 6-20 mg/dL 13  Creatinine Latest Ref Range: 0.44-1.00 mg/dL 1.03 (H)  Calcium Latest Ref Range: 8.9-10.3 mg/dL 9.2  EGFR (Non-African Amer.) Latest Ref Range: >60 mL/min 51 (L)  EGFR (African American) Latest Ref Range: >60 mL/min 59 (L)  Glucose Latest Ref Range: 65-99 mg/dL 177 (H)  Anion gap Latest Ref Range: 5-15  6  Alkaline Phosphatase Latest Ref Range: 38-126 U/L 59  Albumin Latest Ref Range: 3.5-5.0 g/dL 2.8 (L)  AST Latest Ref Range: 15-41 U/L 14 (L)  ALT Latest Ref Range: 14-54 U/L 10 (L)  Total Protein Latest Ref Range: 6.5-8.1 g/dL 6.2 (L)  Total Bilirubin Latest Ref Range: 0.3-1.2 mg/dL 0.8  WBC Latest Ref Range: 4.0-10.5 K/uL 11.0 (H)  RBC Latest Ref Range: 3.87-5.11 MIL/uL 3.07 (L)  Hemoglobin Latest Ref Range: 12.0-15.0 g/dL 8.6 (L)  HCT Latest Ref Range: 36.0-46.0 % 27.2 (L)  MCV Latest Ref Range: 78.0-100.0 fL 88.6  MCH Latest Ref Range: 26.0-34.0 pg 28.0  MCHC Latest Ref Range: 30.0-36.0 g/dL 31.6  RDW Latest Ref Range: 11.5-15.5 % 15.3  Platelets Latest Ref  Range: 150-400 K/uL 272   CBG (last 3)   Recent Labs  10/01/15 1618 10/01/15 2100 10/02/15 0707  GLUCAP 122* 167* 152*     Exam  Gen: awake and alert, NAD, sitting in bedside chair Lungs: breathing unlabored  Cardiac: regular  Ext:       Right Lower Extremity   Dressing removed  Moderate bloody drainage  No erythema   Swelling stable  Distal motor and sensory functions intact  Ext warm  + DP pulse   No DCT   Comparments soft and NT   Knee gets to full extension w/o difficulty     Assessment and Plan   POD/HD#: 2  80 y/o female s/p R TKA   1. R knee DJD s/p R TKA   WBAT  Dressing changes as needed  CPM and bone foam  PT/OT  Ice and elevate  2. . Pain management:  Continue with current regimen  3. ABL anemia/Hemodynamics  Recheck cbc in am  Vitals stable    4. Medical issues   DM   Continue SSI      Continue home meds as well  5. DVT/PE prophylaxis:  Lovenox    6.  Activity:  Up as tolerated  PT/OT  7. FEN/GI prophylaxis/Foley/Lines:  Carb mod diet  NSL   8. Dispo:  Continue with therapies  Dc tomorrow     Jari Pigg, PA-C [facility] [phone] (O) 10/02/2015 10:04 AM

## 2015-10-03 LAB — CBC
HCT: 22.8 % — ABNORMAL LOW (ref 36.0–46.0)
HEMOGLOBIN: 7.4 g/dL — AB (ref 12.0–15.0)
MCH: 28.4 pg (ref 26.0–34.0)
MCHC: 32.5 g/dL (ref 30.0–36.0)
MCV: 87.4 fL (ref 78.0–100.0)
PLATELETS: 256 10*3/uL (ref 150–400)
RBC: 2.61 MIL/uL — AB (ref 3.87–5.11)
RDW: 15.1 % (ref 11.5–15.5)
WBC: 12 10*3/uL — ABNORMAL HIGH (ref 4.0–10.5)

## 2015-10-03 LAB — GLUCOSE, CAPILLARY
GLUCOSE-CAPILLARY: 156 mg/dL — AB (ref 65–99)
GLUCOSE-CAPILLARY: 107 mg/dL — AB (ref 65–99)
GLUCOSE-CAPILLARY: 128 mg/dL — AB (ref 65–99)
Glucose-Capillary: 166 mg/dL — ABNORMAL HIGH (ref 65–99)

## 2015-10-03 LAB — HEMOGLOBIN AND HEMATOCRIT, BLOOD
HCT: 29.2 % — ABNORMAL LOW (ref 36.0–46.0)
Hemoglobin: 9.2 g/dL — ABNORMAL LOW (ref 12.0–15.0)

## 2015-10-03 LAB — PREPARE RBC (CROSSMATCH)

## 2015-10-03 MED ORDER — FUROSEMIDE 10 MG/ML IJ SOLN
20.0000 mg | Freq: Once | INTRAMUSCULAR | Status: AC
Start: 2015-10-03 — End: 2015-10-03
  Administered 2015-10-03: 20 mg via INTRAVENOUS
  Filled 2015-10-03: qty 2

## 2015-10-03 MED ORDER — SODIUM CHLORIDE 0.9 % IV SOLN
Freq: Once | INTRAVENOUS | Status: AC
  Administered 2015-10-03: 12:00:00 via INTRAVENOUS

## 2015-10-03 NOTE — Progress Notes (Signed)
Called returned by Mellody Dance, [place] (Ortho). Patient will stay another night.

## 2015-10-03 NOTE — Anesthesia Postprocedure Evaluation (Deleted)
Anesthesia Post Note  Patient: [name] [name]  Procedure(s) Performed: Procedure(s) (LRB): RIGHT TOTAL KNEE ARTHROPLASTY (Right)  Patient location during evaluation: PACU Anesthesia Type: General Level of consciousness: awake Pain management: pain level controlled Vital Signs Assessment: post-procedure vital signs reviewed and stable Respiratory status: spontaneous breathing Cardiovascular status: stable Anesthetic complications: no    Last Vitals:  Filed Vitals:   10/03/15 1610 10/03/15 1628  BP: 134/60 127/56  Pulse: 89 99  Temp: 37.1 C 37.2 C  Resp: 16 14    Last Pain:  Filed Vitals:   10/03/15 1811  PainSc: 3                  EDWARDS,Rozella Servello      

## 2015-10-03 NOTE — Care Management Note (Signed)
Case Management Note  Patient Details  Name: [name] [name] MRN: [id number] Date of Birth: Apr 21, 1936  Subjective/Objective:            S/p right total knee arthroplasty        Action/Plan: Spoke with patient and her husband about discharge plan. They selected [facility]. Contacted [facility] at [facility] and set up HHPT and HHOT. [employer]formerly [employer]delivered CPM, rolling walker and 3N1 to patient's home. Patient 's husband will be able to assist patient after discharge.    Expected Discharge Date:                  Expected Discharge Plan:  Home w Home Health Services  In-House Referral:  NA  Discharge planning Services  CM Consult  Post Acute Care Choice:  Durable Medical Equipment, Home Health Choice offered to:  Patient, Spouse  DME Arranged:  3-N-1, CPM, Walker rolling DME Agency:  [employer]  HH Arranged:  PT, OT HH Agency:  [facility]  Status of Service:  Completed, signed off  Medicare Important Message Given:  Yes Date Medicare IM Given:    Medicare IM give by:    Date Additional Medicare IM Given:    Additional Medicare Important Message give by:     If discussed at Long Length of Stay Meetings, dates discussed:    Additional Comments:  Monica Becton, RN 10/03/2015, 12:23 PM

## 2015-10-03 NOTE — Progress Notes (Signed)
Patient called out to the nurses station after getting out if bed without assistance. Patient was compliant while spouse was at bedside. Patient was confused as to where she was, "I am home aint I". Patient had already pulled out her PIV to left FA. IV site assessed, linen changed and new PIV placed.  Patient reoriented and assisted back to bed. Patient called her daughter who came and spent the remainder if the night with patient.

## 2015-10-03 NOTE — Discharge Summary (Signed)
PATIENT ID: [name] [name]        MRN:  [id number]          DOB/AGE: 30-Aug-1935 / 80 y.o.    DISCHARGE SUMMARY  ADMISSION DATE:    09/30/2015 DISCHARGE DATE:   10/03/2015   ADMISSION DIAGNOSIS: RT KNEE OA    DISCHARGE DIAGNOSIS:  RT KNEE OA    ADDITIONAL DIAGNOSIS: Active Problems:   Primary localized osteoarthritis of right knee  Past Medical History  Diagnosis Date  . Asthma   . GERD (gastroesophageal reflux disease)   . Hypertension   . CAD (coronary artery disease)   . Obesity   . Diabetes mellitus   . Cervical spondylosis 03/26/2012  . Benign paroxysmal positional vertigo 11/05/2012  . CHF (congestive heart failure) (HCC)   . Shortness of breath   . Anxiety   . Complication of anesthesia     AGE 80 APPENDECTOMY WOKE UP, NO TROUBLE SINCE  . Difficult intubation     no mri,  has pins in ears    PROCEDURE: Procedure(s): RIGHT TOTAL KNEE ARTHROPLASTY Right on 09/30/2015  CONSULTS: PT/OT      HISTORY:  See H&P in chart  HOSPITAL COURSE:  [name] [name] is a 80 y.o. admitted on 09/30/2015 and found to have a diagnosis of RT KNEE OA.  After appropriate laboratory studies were obtained  they were taken to the operating room on 09/30/2015 and underwent  Procedure(s): RIGHT TOTAL KNEE ARTHROPLASTY  Right.   They were given perioperative antibiotics:  Anti-infectives    Start     Dose/Rate Route Frequency Ordered Stop   09/30/15 1730  ceFAZolin (ANCEF) IVPB 2 g/50 mL premix     2 g 100 mL/hr over 30 Minutes Intravenous Every 6 hours 09/30/15 1622 09/30/15 2304   09/30/15 0754  ceFAZolin (ANCEF) IVPB 2 g/50 mL premix     2 g 100 mL/hr over 30 Minutes Intravenous On call to O.R. 09/30/15 4098 09/30/15 1125    .  Tolerated the procedure well.    POD #1, allowed out of bed to a chair.  PT for ambulation and exercise program.  IV saline locked.  O2 discontionued.  POD #2, continued PT and ambulation.    POD#3, some confusion, symptomatic ABLA, transfused 2 units  of RBC's which she had autologously donated prior to surgery  The remainder of the hospital course was dedicated to ambulation and strengthening.   The patient was discharged on 3 Days Post-Op in  Stable condition.  Blood products given:2 units  CC PRBC  DIAGNOSTIC STUDIES: Recent vital signs: Patient Vitals for the past 24 hrs:  BP Temp Temp src Pulse Resp SpO2  10/03/15 0536 (!) 112/58 mmHg 99.2 F (37.3 C) Oral 91 18 97 %  10/02/15 2118 138/70 mmHg 99 F (37.2 C) Oral 98 18 98 %  10/02/15 2012 - - - - - 99 %  10/02/15 1351 (!) 126/58 mmHg 98.7 F (37.1 C) Oral 84 18 98 %       Recent laboratory studies:  Recent Labs  09/30/15 1650 10/01/15 0452 10/02/15 0330 10/03/15 0315  WBC 13.3* 9.6 11.0* 12.0*  HGB 9.8* 9.1* 8.6* 7.4*  HCT 31.3* 29.1* 27.2* 22.8*  PLT 305 319 272 256    Recent Labs  09/30/15 1650 10/01/15 0452 10/02/15 0330  NA  --  137 139  K  --  3.9 4.0  CL  --  100* 102  CO2  --  29 31  BUN  --  12 13  CREATININE 0.93 0.92 1.03*  GLUCOSE  --  179* 177*  CALCIUM  --  9.0 9.2   Lab Results  Component Value Date   INR 1.00 08/15/2015   INR 0.96 03/03/2015     Recent Radiographic Studies :  No results found.  DISCHARGE INSTRUCTIONS:   DISCHARGE MEDICATIONS:     Medication List    TAKE these medications        albuterol 108 (90 Base) MCG/ACT inhaler  Commonly known as:  PROVENTIL HFA  Inhale 2 puffs into the lungs every 6 (six) hours as needed for shortness of breath.     clonazePAM 1 MG tablet  Commonly known as:  KLONOPIN  Take 1 mg by mouth 2 (two) times daily as needed for anxiety.     docusate sodium 100 MG capsule  Commonly known as:  STOOL SOFTENER  Take 1 capsule (100 mg total) by mouth daily as needed for mild constipation or moderate constipation.     enoxaparin 30 MG/0.3ML injection  Commonly known as:  LOVENOX  Inject 0.3 mLs (30 mg total) into the skin every 12 (twelve) hours.     gabapentin 300 MG capsule  Commonly  known as:  NEURONTIN  Take 300 mg by mouth at bedtime.     HUMALOG MIX 75/25 KWIKPEN (75-25) 100 UNIT/ML Kwikpen  Generic drug:  Insulin Lispro Prot & Lispro  Inject 17 Units into the skin every evening. As directed     HYDROcodone-acetaminophen 5-325 MG tablet  Commonly known as:  NORCO  Take 1-2 tablets by mouth every 4 (four) hours as needed for moderate pain.     metFORMIN 500 MG tablet  Commonly known as:  GLUCOPHAGE  Take 500 mg by mouth 2 (two) times daily with a meal.     methocarbamol 500 MG tablet  Commonly known as:  ROBAXIN  Take 500 mg by mouth every 8 (eight) hours as needed for muscle spasms.     NOVOLOG MIX 70/30 FLEXPEN (70-30) 100 UNIT/ML FlexPen  Generic drug:  insulin aspart protamine - aspart  Inject 17 Units into the skin daily with supper.     omeprazole 20 MG capsule  Commonly known as:  PRILOSEC  Take 1 capsule by mouth 2 (two) times daily.     simvastatin 40 MG tablet  Commonly known as:  ZOCOR  Take 40 mg by mouth at bedtime.     SYMBICORT 80-4.5 MCG/ACT inhaler  Generic drug:  budesonide-formoterol  Inhale 1 puff into the lungs 2 (two) times daily.     valsartan-hydrochlorothiazide 160-12.5 MG tablet  Commonly known as:  DIOVAN-HCT  Take 1 tablet by mouth daily.        FOLLOW UP VISIT:       Follow-up Information    Follow up with CAFFREY JR,W D, MD. Schedule an appointment as soon as possible for a visit in 2 weeks.   Specialty:  Orthopedic Surgery   Contact information:   [address] [place] [address] [phone]       DISPOSITION:   Home  CONDITION:  Stable   Margart Sickles, PA-C  10/03/2015 8:59 AM

## 2015-10-03 NOTE — Plan of Care (Signed)
Problem: Safety: Goal: Ability to remain free from injury will improve Outcome: Progressing Variance: Physical/mental limitations Comments: Patient appears more confused at night.

## 2015-10-03 NOTE — Care Management Important Message (Signed)
Important Message  Patient Details  Name: ADALEI NOVELL MRN: [id number] Date of Birth: 1936-08-18   Medicare Important Message Given:  Yes    Rayvon Char 10/03/2015, 11:39 AMImportant Message  Patient Details  Name: CHERAY PARDI MRN: [id number] Date of Birth: 11-14-1935   Medicare Important Message Given:  Yes    [place] Kappes G 10/03/2015, 11:38 AM

## 2015-10-03 NOTE — Progress Notes (Signed)
Patient's blood has just finished. Labs are still needed. Patient and patient's family member does not want to drive home after dark. Discharge order place earlier today. On-call notified. Awaiting a call.

## 2015-10-03 NOTE — Progress Notes (Signed)
Physical Therapy Treatment Patient Details Name: [name] [name] MRN: [id number] DOB: 28-Feb-1936 Today's Date: 10/03/2015    History of Present Illness 80 y.o. female s/p R TKA. PMH includes asthma, GERD, HTN, CAD, obesity, diabetes mellitus, cervical spondylosis, CHF, SOB, anxiety, lumbar laminectomy, rotator cuff repair, benign paroxysmal positional vertigo.    PT Comments    Patient continues to progress toward mobility goals with less c/o pain this session. Pt received stair training with husband present and actively participating. Daughter present for session as well. Continue to progress as tolerated with anticipated d/c home with HHPT.   Follow Up Recommendations  Home health PT;Supervision/Assistance - 24 hour     Equipment Recommendations  3in1 (PT)    Recommendations for Other Services       Precautions / Restrictions Precautions Precautions: Knee;Fall Precaution Booklet Issued: Yes (comment) Required Braces or Orthoses: Knee Immobilizer - Right Knee Immobilizer - Right: On when out of bed or walking Restrictions RLE Weight Bearing: Weight bearing as tolerated    Mobility  Bed Mobility Overal bed mobility: Needs Assistance Bed Mobility: Supine to Sit;Sit to Supine     Supine to sit: Min assist     General bed mobility comments: HOB flat and min use of bedrails; assistance with elevating bilat LE to bed and scooting hips forward when in sitting; vc for seqeuncing and increased time needed   Transfers Overall transfer level: Needs assistance Equipment used: Rolling walker (2 wheeled) Transfers: Sit to/from Stand Sit to Stand: Min guard         General transfer comment: min guard for safety; vc for hand placement and technique  Ambulation/Gait Ambulation/Gait assistance: Min guard Ambulation Distance (Feet): 125 Feet Assistive device: Rolling walker (2 wheeled) Gait Pattern/deviations: Step-to pattern;Step-through pattern;Decreased stance time -  right;Decreased step length - left;Trunk flexed Gait velocity: decreased   General Gait Details: carry over of sequencing; vc for position of RW and upright posture   Stairs Stairs: Yes Stairs assistance: Min assist Stair Management: No rails;Backwards;With walker;One rail Right;Sideways Number of Stairs: 4 (2X2) General stair comments: pt demonstated difficulty ascending stairs sideways using R rail and required seated rest break; pt educated and given handout on ascend/descend backward with RW; husband present and actively participating  Wheelchair Mobility    Modified Rankin (Stroke Patients Only)       Balance                                    Cognition Arousal/Alertness: Awake/alert Behavior During Therapy: WFL for tasks assessed/performed Overall Cognitive Status: Within Functional Limits for tasks assessed                      Exercises Total Joint Exercises Quad Sets: AROM;Right;10 reps;Supine Heel Slides: AROM;AAROM;Right;10 reps;Supine Goniometric ROM: 0-40    General Comments General comments (skin integrity, edema, etc.): husband and daughter present for session      Pertinent Vitals/Pain Pain Assessment: No/denies pain Pain Intervention(s): Monitored during session;Premedicated before session    Home Living                      Prior Function            PT Goals (current goals can now be found in the care plan section) Acute Rehab PT Goals Patient Stated Goal: go home PT Goal Formulation: With patient Time For Goal Achievement: 10/15/15 Potential  to Achieve Goals: Good Progress towards PT goals: Progressing toward goals    Frequency  7X/week    PT Plan Current plan remains appropriate    Co-evaluation             End of Session Equipment Utilized During Treatment: Gait belt;Right knee immobilizer Activity Tolerance: Patient tolerated treatment well Patient left: with call bell/phone within  reach;with family/visitor present;in bed     Time: 1610-9604 PT Time Calculation (min) (ACUTE ONLY): 43 min  Charges:  $Gait Training: 23-37 mins $Therapeutic Exercise: 8-22 mins                    G Codes:      Derek Mound, PTA Pager: [phone]   10/03/2015, 4:15 PM

## 2015-10-03 NOTE — Anesthesia Postprocedure Evaluation (Signed)
Anesthesia Post Note  Patient: [name] [name]  Procedure(s) Performed: Procedure(s) (LRB): RIGHT TOTAL KNEE ARTHROPLASTY (Right)  Patient location during evaluation: PACU Anesthesia Type: General Level of consciousness: awake Pain management: pain level controlled Vital Signs Assessment: post-procedure vital signs reviewed and stable Respiratory status: spontaneous breathing Cardiovascular status: stable Anesthetic complications: no    Last Vitals:  Filed Vitals:   10/03/15 1610 10/03/15 1628  BP: 134/60 127/56  Pulse: 89 99  Temp: 37.1 C 37.2 C  Resp: 16 14    Last Pain:  Filed Vitals:   10/03/15 1811  PainSc: 3                  EDWARDS,Jubal Rademaker

## 2015-10-03 NOTE — Progress Notes (Signed)
Physical Therapy Treatment Patient Details Name: [name] [name] MRN: [id number] DOB: 01-Apr-1936 Today's Date: 10/03/2015    History of Present Illness 80 y.o. female s/p R TKA. PMH includes asthma, GERD, HTN, CAD, obesity, diabetes mellitus, cervical spondylosis, CHF, SOB, anxiety, lumbar laminectomy, rotator cuff repair, benign paroxysmal positional vertigo.    PT Comments    Noting very good progress with gait distance; still with considerable difficulty getting to EOB and going from sit to stand; I would love to have two therapy sessions with her tomorrow if possible to take advantage of this momentum and progress and maximize independence and safety with mobility prior to dc home.   Follow Up Recommendations  Home health PT;Supervision/Assistance - 24 hour     Equipment Recommendations  3in1 (PT)    Recommendations for Other Services       Precautions / Restrictions Precautions Precautions: Knee;Fall Precaution Booklet Issued: Yes (comment) Precaution Comments: Pt educated to not allow any pillow or bolster under knee for healing with optimal range of motion.  Required Braces or Orthoses: Knee Immobilizer - Right Knee Immobilizer - Right: On when out of bed or walking Restrictions Weight Bearing Restrictions: Yes RLE Weight Bearing: Weight bearing as tolerated    Mobility  Bed Mobility Overal bed mobility: Needs Assistance Bed Mobility: Supine to Sit     Supine to sit: Mod assist;HOB elevated     General bed mobility comments: verbal cues for sequencing, heavy use of bed rail  Transfers Overall transfer level: Needs assistance Equipment used: Rolling walker (2 wheeled) Transfers: Sit to/from Stand Sit to Stand: Mod assist         General transfer comment: verbal cues for hand placement and sequencing; practriced from recliner as well; mod asssit from bed, min assist from recliner using rails; tending to flop with stand to sit to  recliner  Ambulation/Gait Ambulation/Gait assistance: Min assist Ambulation Distance (Feet): 60 Feet Assistive device: Rolling walker (2 wheeled) Gait Pattern/deviations: Step-to pattern;Trunk flexed Gait velocity: decreased   General Gait Details: Cues for gait sequence, and to push down into RW to unweigh painful RLE in stance; RW lowered for better fit   Information systems manager Rankin (Stroke Patients Only)       Balance                                    Cognition Arousal/Alertness: Awake/alert Behavior During Therapy: WFL for tasks assessed/performed Overall Cognitive Status: Within Functional Limits for tasks assessed                      Exercises      General Comments        Pertinent Vitals/Pain Pain Assessment: 0-10 Pain Score: 6  Pain Location: R knee Pain Descriptors / Indicators: Aching;Discomfort;Grimacing Pain Intervention(s): Limited activity within patient's tolerance;Monitored during session;RN gave pain meds during session    Home Living                      Prior Function            PT Goals (current goals can now be found in the care plan section) Acute Rehab PT Goals Patient Stated Goal: to go home; she is aware that she was confused earlier, and hopes that never happens again PT Goal  Formulation: With patient Time For Goal Achievement: 10/15/15 Potential to Achieve Goals: Good Progress towards PT goals: Progressing toward goals    Frequency  7X/week    PT Plan Current plan remains appropriate    Co-evaluation             End of Session Equipment Utilized During Treatment: Gait belt;Right knee immobilizer Activity Tolerance: Patient tolerated treatment well Patient left: in chair;in CPM;with call bell/phone within reach;with family/visitor present     Time: 0981-1914 PT Time Calculation (min) (ACUTE ONLY): 40 min  Charges:  $Gait Training: 23-37  mins $Therapeutic Activity: 8-22 mins                    G Codes:      Van Clines Hamff 10/03/2015, 11:01 AM  Van Clines, PT  Acute Rehabilitation Services Pager [phone] Office [phone]

## 2015-10-04 ENCOUNTER — Encounter (HOSPITAL_COMMUNITY): Payer: Self-pay | Admitting: Orthopedic Surgery

## 2015-10-04 LAB — TYPE AND SCREEN
ABO/RH(D): O POS
ABO/RH(D): O POS
ANTIBODY SCREEN: POSITIVE
ANTIBODY SCREEN: POSITIVE
DAT, IgG: NEGATIVE
UNIT DIVISION: 0
Unit division: 0
Unit division: 0

## 2015-10-04 LAB — GLUCOSE, CAPILLARY
GLUCOSE-CAPILLARY: 152 mg/dL — AB (ref 65–99)
GLUCOSE-CAPILLARY: 119 mg/dL — AB (ref 65–99)

## 2015-10-04 NOTE — Progress Notes (Signed)
Pt and husband instructed on giving lovenox shots verbalized understanding

## 2015-10-04 NOTE — Progress Notes (Signed)
PT Cancellation Note  Patient Details Name: FORTUNE TOROSIAN MRN: [id number] DOB: 10-16-1935   Cancelled Treatment:    Reason Eval/Treat Not Completed: Other (comment) (Pt has been d/c)  Michail Jewels PT, DPT [phone] Pager: [phone] 10/04/2015, 3:15 PM

## 2015-10-04 NOTE — Progress Notes (Signed)
Occupational Therapy Treatment Patient Details Name: [name] [name] MRN: [id number] DOB: [birth year] Today's Date: 10/04/2015    History of present illness 80 y.o. female s/p R TKA. PMH includes asthma, GERD, HTN, CAD, obesity, diabetes mellitus, cervical spondylosis, CHF, SOB, anxiety, lumbar laminectomy, rotator cuff repair, benign paroxysmal positional vertigo.   OT comments  Pt demonstrating good progress towards occupational therapy goals. Pt completed all functional transfers with min guard-min assist and LB ADLs with min assist. No overt LOB or knee buckling noted during session. Reviewed use of AE and compensatory strategies for ADLs, safe shower transfer sequencing, fall prevention, energy conservation, and pain management strategies. Will continue to follow acutely to address OT needs and goals.    Follow Up Recommendations  Home health OT;Supervision/Assistance - 24 hour    Equipment Recommendations  Other (comment) (AE - pt to purchase)    Recommendations for Other Services      Precautions / Restrictions Precautions Precautions: Knee;Fall Precaution Booklet Issued: No Precaution Comments: Reviewed not placing pillow or other objects underneath R knee  Required Braces or Orthoses: Knee Immobilizer - Right Knee Immobilizer - Right: On when out of bed or walking Restrictions Weight Bearing Restrictions: Yes RLE Weight Bearing: Weight bearing as tolerated       Mobility Bed Mobility Overal bed mobility: Needs Assistance Bed Mobility: Supine to Sit     Supine to sit: Min assist     General bed mobility comments: HOB flat, no use of bedrails, min assist for trunk support to come to sitting position. Pt reports her husband can provide this level of assist at home.  Transfers Overall transfer level: Needs assistance Equipment used: Rolling walker (2 wheeled) Transfers: Sit to/from Stand Sit to Stand: Min assist         General transfer comment: Min assist  for initial sit-stand transfer and min guard assist for all subsequent transfers. Min verbal cues for safe hand placement on seated surfaces. Pt fatigues quickly and flopped into chair at end of session - educated pt on importance of controlled descent to chair for strengthening and safety.    Balance Overall balance assessment: Needs assistance Sitting-balance support: No upper extremity supported;Feet supported Sitting balance-Leahy Scale: Fair     Standing balance support: Bilateral upper extremity supported;During functional activity Standing balance-Leahy Scale: Poor Standing balance comment: Requires bilateral UE support or lean against counter for support to maintain balance                   ADL Overall ADL's : Needs assistance/impaired     Grooming: Wash/dry hands;Oral care;Min guard;Standing   Upper Body Bathing: Supervision/ safety   Lower Body Bathing: Min guard;Sit to/from stand;Cueing for compensatory techniques   Upper Body Dressing : Supervision/safety;Sitting   Lower Body Dressing: Minimal assistance;Sitting/lateral leans;Cueing for compensatory techniques Lower Body Dressing Details (indicate cue type and reason): Cues to dress R leg first and undress it last Toilet Transfer: Min guard;Cueing for safety;Ambulation;BSC;RW Toilet Transfer Details (indicate cue type and reason): BSC over toilet, cues to feel BSC on back of legs before reaching back to sit Toileting- Clothing Manipulation and Hygiene: Min guard;Sit to/from stand   Tub/ Shower Transfer: Walk-in shower;Min guard;Cueing for sequencing;Ambulation;Rolling walker Tub/Shower Transfer Details (indicate cue type and reason): Cues for proper step sequence with RW Functional mobility during ADLs: Min guard;Rolling walker General ADL Comments: Reviewed LB ADL compensatory strategies, knee immobilizer and 0 bone foam wear protocol, safe step sequence for shower transfer. Educated pt and husband  on AE  availability, fall prevention, energy conservation, and use of ice after activity.      Vision                     Perception     Praxis      Cognition   Behavior During Therapy: WFL for tasks assessed/performed Overall Cognitive Status: Within Functional Limits for tasks assessed                       Extremity/Trunk Assessment               Exercises     Shoulder Instructions       General Comments      Pertinent Vitals/ Pain       Pain Assessment: 0-10 Pain Score: 5  Pain Location: R knee Pain Descriptors / Indicators: Aching;Sore Pain Intervention(s): Limited activity within patient's tolerance;Monitored during session;Repositioned;Ice applied  Home Living                                          Prior Functioning/Environment              Frequency       Progress Toward Goals  OT Goals(current goals can now be found in the care plan section)  Progress towards OT goals: Progressing toward goals  Acute Rehab OT Goals Patient Stated Goal: to go home OT Goal Formulation: With patient Time For Goal Achievement: 10/08/15 Potential to Achieve Goals: Good ADL Goals Pt Will Perform Lower Body Dressing: with adaptive equipment;sit to/from stand;with min guard assist Pt Will Transfer to Toilet: ambulating;with min assist Pt Will Perform Toileting - Clothing Manipulation and hygiene: with min guard assist;sit to/from stand  Plan Discharge plan remains appropriate    Co-evaluation                 End of Session Equipment Utilized During Treatment: Gait belt;Rolling walker;Right knee immobilizer CPM Right Knee CPM Right Knee: Other (Comment) (pt. refused cpm)   Activity Tolerance Patient tolerated treatment well   Patient Left in chair;with call bell/phone within reach;with family/visitor present;Other (comment) (0 bone foam applied)   Nurse Communication Mobility status        Time: 0822-0904 OT  Time Calculation (min): 42 min  Charges: OT General Charges $OT Visit: 1 Procedure OT Treatments $Self Care/Home Management : 38-52 mins  Nils Pyle, OTR/L Pager: [phone] 10/04/2015, 9:20 AM

## 2015-10-04 NOTE — Progress Notes (Signed)
PT Cancellation Note  Patient Details Name: [name] [name] MRN: [id number] DOB: [birth year]   Cancelled Treatment:    Reason Eval/Treat Not Completed: Pain limiting ability to participate Pt declined, reporting 10/10 pain. Pt given pain medication 25 minutes prior to attempt of PT session. PT will check on pt later as time allows.    Derek Mound, PTA Pager: [phone]   10/04/2015, 9:58 AM

## 2016-01-05 IMAGING — CT CT PELVIS W/O CM
5 of 14 series · 18 of 42 positions shown, 19 images · non-contrast
Comparison: 09/03/2014 plain film exam. 08/16/2014 CT. Lumbar spine
CT performed same date and dictated separately.

CLINICAL DATA: 78-year-old female with low back pain and right hip
pain since fall 09/04/2014. Some left leg sciatica. Initial
encounter.

EXAM:
CT PELVIS WITHOUT CONTRAST
TECHNIQUE: Multidetector CT imaging of the pelvis was performed following the
standard protocol without intravenous contrast.

[Series 4: l spine bone · axial · 0.27mm/px · z∈[-269,-91]mm · 6 of 101 slices shown]
[im 15/101  bone]
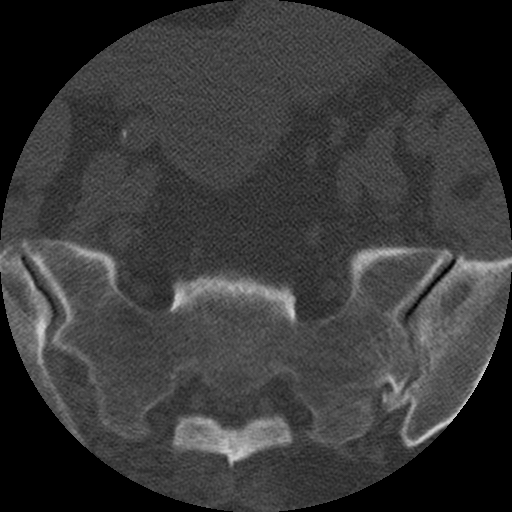
[im 29/101  bone]
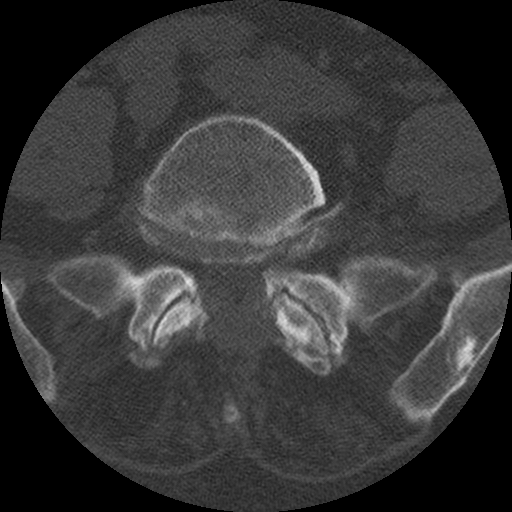
[im 43/101  bone]
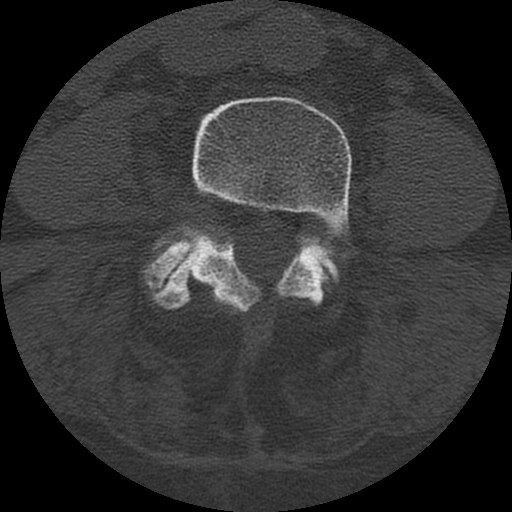
[im 58/101  bone]
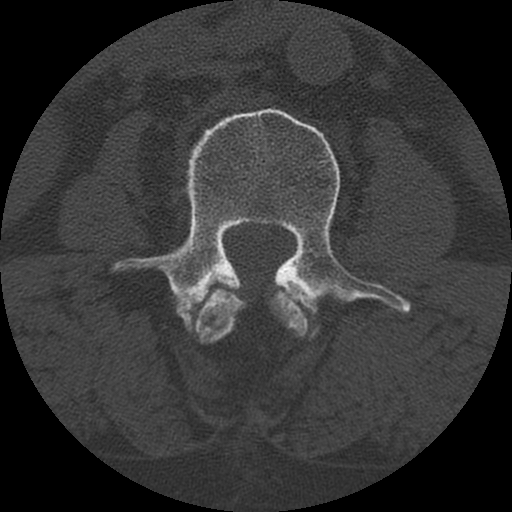
[im 72/101  bone]
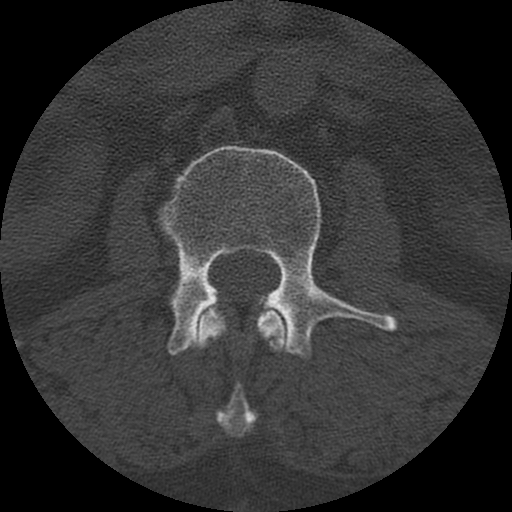
[im 86/101  bone]
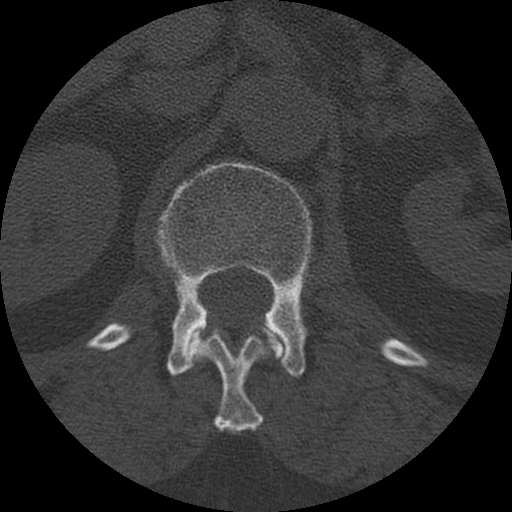

[Series 5: l spine detail · axial · 0.27mm/px · z∈[-269,-91]mm · 6 of 101 slices shown]
[im 15/101  bone]
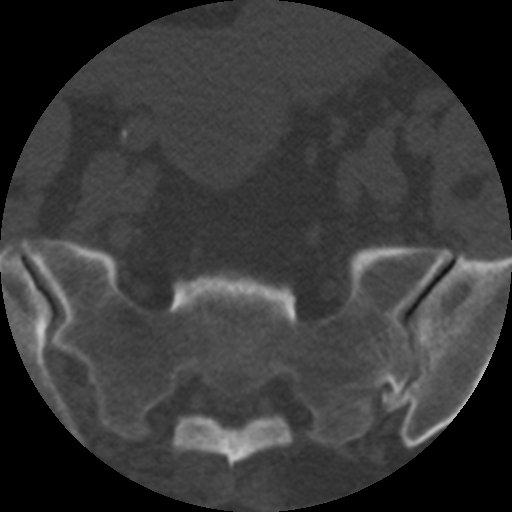
[im 29/101  bone]
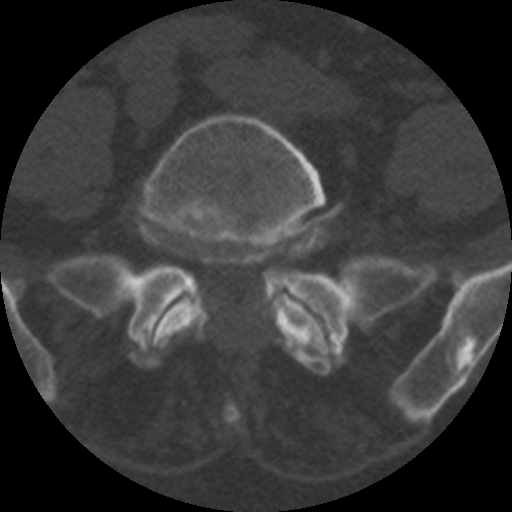
[im 43/101  bone]
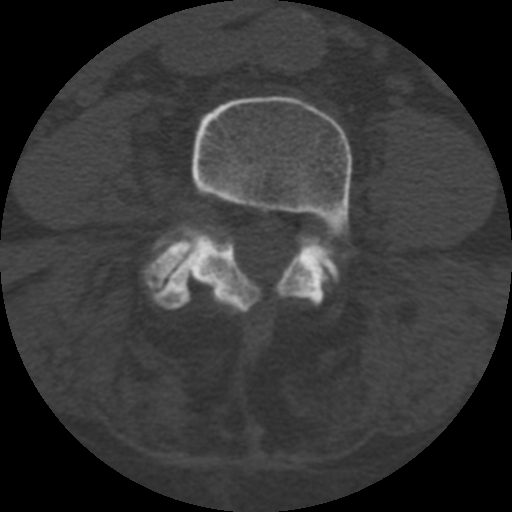
[im 58/101  bone]
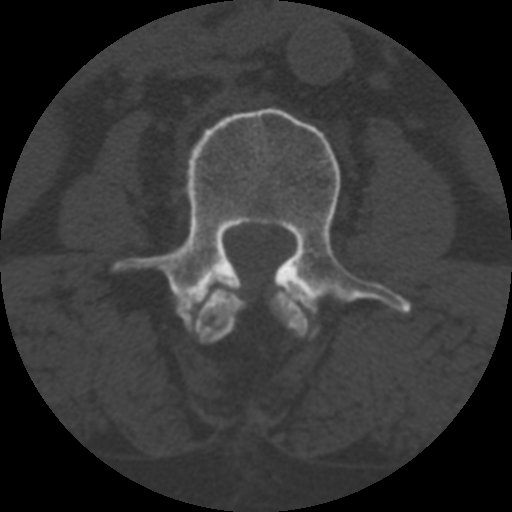
[im 72/101  bone]
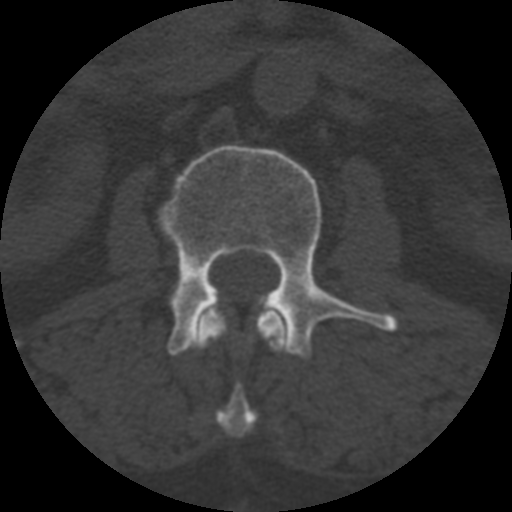
[im 86/101  bone]
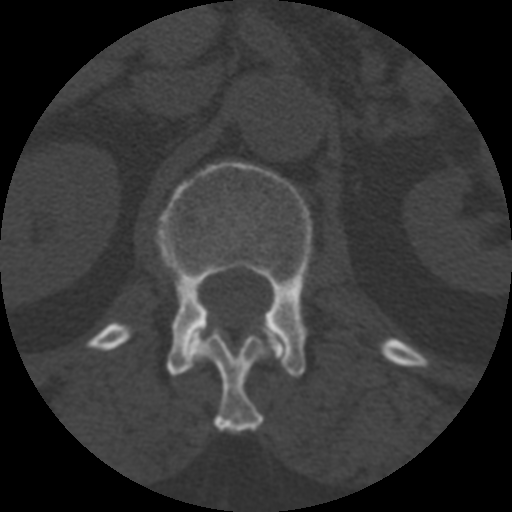

[Series 8: pelvis soft · axial · 0.66mm/px · z∈[-392,-314]mm · 3 of 93 slices shown]
[im 16/93  soft-tissue]
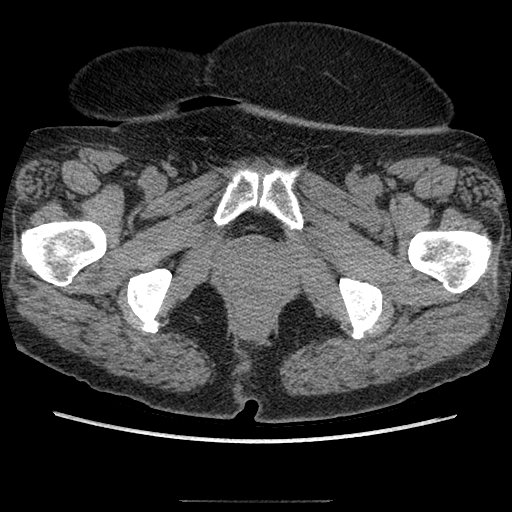
[im 31/93  soft-tissue]
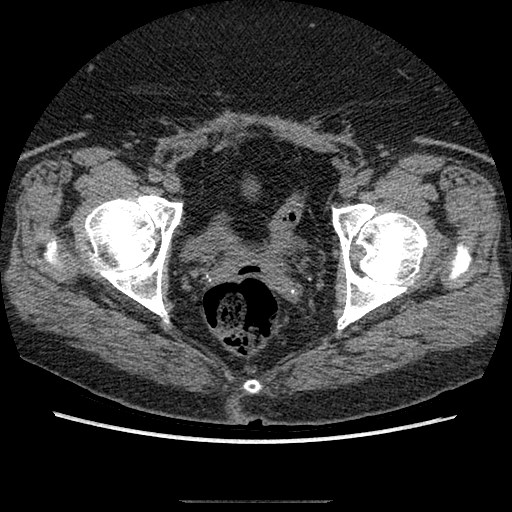
[im 47/93  soft-tissue]
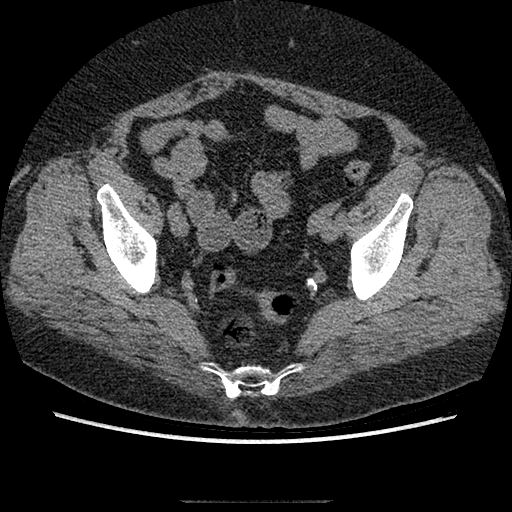

[Series 305: axial l-sp · axial · 0.25mm/px · z∈[-299,-273]mm · 2 of 47 slices shown, 3 images]
[im 16/47  soft-tissue]
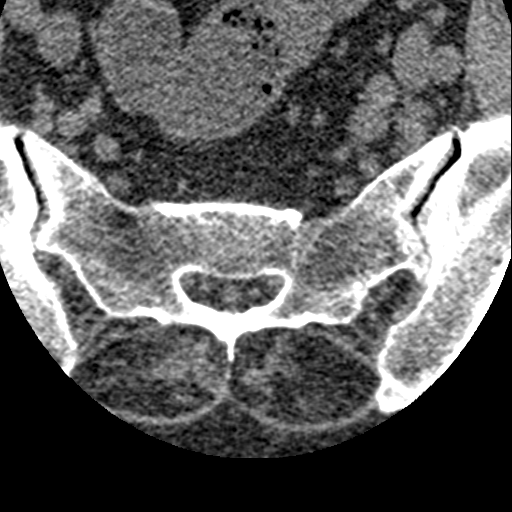
[im 16/47  bone]
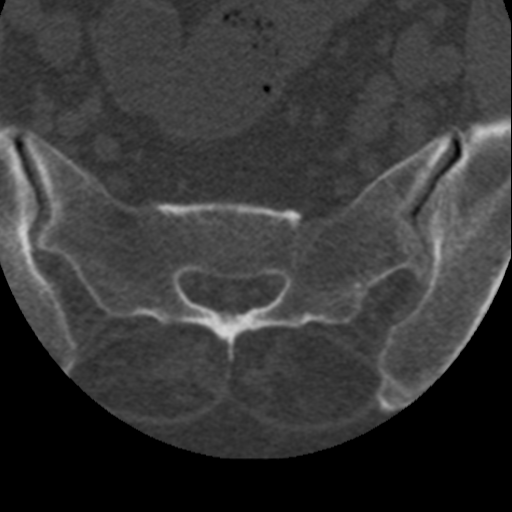
[im 31/47  bone]
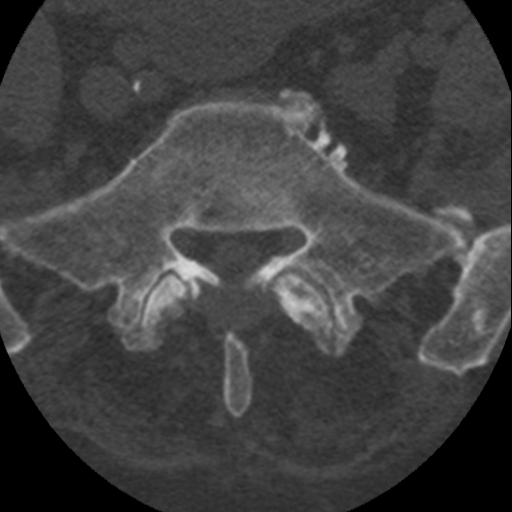

[Series 901: sag pelvis st · sagittal · 0.66mm/px · 1 of 165 slices shown]
[im 83/165  bone]
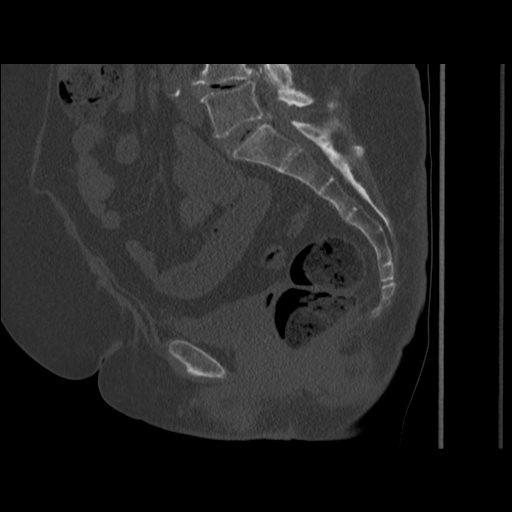

[18 of 42 positions shown; findings below may reference images not displayed]

FINDINGS: No CT evidence of hip or pelvic fracture.

Mild bilateral hip joint degenerative changes.

Degenerative changes lumbar spine described on CT of the lumbar
spine.

Vascular calcifications.

No evidence of hip joint effusion or pelvic hematoma.
IMPRESSION: No CT evidence of hip or pelvic fracture.

Mild bilateral hip joint degenerative changes.

## 2016-06-18 ENCOUNTER — Ambulatory Visit (INDEPENDENT_AMBULATORY_CARE_PROVIDER_SITE_OTHER): Payer: Medicare Other | Admitting: *Deleted

## 2016-06-18 DIAGNOSIS — Z23 Encounter for immunization: Secondary | ICD-10-CM

## 2016-06-25 ENCOUNTER — Ambulatory Visit
Admission: RE | Admit: 2016-06-25 | Discharge: 2016-06-25 | Disposition: A | Discharge status: Home / Self Care | Payer: Medicare Other | Source: Ambulatory Visit | Attending: Orthopedic Surgery | Admitting: Orthopedic Surgery

## 2016-06-25 ENCOUNTER — Other Ambulatory Visit: Payer: Self-pay | Admitting: Orthopedic Surgery

## 2016-06-25 DIAGNOSIS — M5440 Lumbago with sciatica, unspecified side: Secondary | ICD-10-CM

## 2016-08-14 ENCOUNTER — Telehealth: Payer: Self-pay | Admitting: Cardiovascular Disease

## 2016-08-14 ENCOUNTER — Encounter: Payer: Self-pay | Admitting: Cardiovascular Disease

## 2016-08-14 NOTE — Telephone Encounter (Signed)
New message     RN verbalized [facility]  RN states that she needs diagnosis that provides if pt has heart failure

## 2016-08-14 NOTE — Telephone Encounter (Signed)
Chart reviewed and documented CHF is in H&P from July 2016 and last documented EF is 63% in 2015. [facility].

## 2017-04-05 ENCOUNTER — Other Ambulatory Visit: Payer: Self-pay | Admitting: Endocrinology

## 2017-04-05 DIAGNOSIS — Z1231 Encounter for screening mammogram for malignant neoplasm of breast: Secondary | ICD-10-CM

## 2017-04-19 ENCOUNTER — Ambulatory Visit
Admission: RE | Admit: 2017-04-19 | Discharge: 2017-04-19 | Disposition: A | Discharge status: Home / Self Care | Payer: Medicare Other | Source: Ambulatory Visit | Attending: Endocrinology | Admitting: Endocrinology

## 2017-04-19 DIAGNOSIS — Z1231 Encounter for screening mammogram for malignant neoplasm of breast: Secondary | ICD-10-CM

## 2018-04-16 ENCOUNTER — Other Ambulatory Visit: Payer: Self-pay | Admitting: Endocrinology

## 2018-04-16 DIAGNOSIS — Z1231 Encounter for screening mammogram for malignant neoplasm of breast: Secondary | ICD-10-CM

## 2018-04-22 ENCOUNTER — Ambulatory Visit
Admission: RE | Admit: 2018-04-22 | Discharge: 2018-04-22 | Disposition: A | Discharge status: Home / Self Care | Payer: Medicare Other | Source: Ambulatory Visit | Attending: Endocrinology | Admitting: Endocrinology

## 2018-04-22 DIAGNOSIS — Z1231 Encounter for screening mammogram for malignant neoplasm of breast: Secondary | ICD-10-CM

## 2018-06-30 ENCOUNTER — Telehealth: Payer: Self-pay | Admitting: Cardiovascular Disease

## 2018-06-30 NOTE — Telephone Encounter (Signed)
Pt c/o Shortness Of Breath: STAT if SOB developed within the last 24 hours or pt is noticeably SOB on the phone  1. Are you currently SOB (can you hear that pt is SOB on the phone)? Did not speak with patient  2. How long have you been experiencing SOB? Patient has been SOB for a week, has been getting worse.   3. Are you SOB when sitting or when up moving around? Only when moving around.   4. Are you currently experiencing any other symptoms? Some swelling in her legs.  Shawn from [employer] called to report on patient symptoms.  She said to please call patient. However, if you have any further question she can be reached at [phone], ext. [phone].

## 2018-06-30 NOTE — Telephone Encounter (Signed)
Called patient who states she is having change in her breathing since the weather changed and she did a lot of walking yesterday. States she has a hx of asthma. Denies swelling. States she was not aware that a message was being sent to Dr. Elease Hashimoto. States she saw her PCP, Dr. Evlyn Kanner last week and does not feel that she needs an appointment with cardiology. She states she will call back if needed and thanked me for the call.

## 2019-05-29 ENCOUNTER — Other Ambulatory Visit: Payer: Self-pay | Admitting: Endocrinology

## 2019-05-29 DIAGNOSIS — Z1231 Encounter for screening mammogram for malignant neoplasm of breast: Secondary | ICD-10-CM

## 2019-07-17 ENCOUNTER — Ambulatory Visit: Payer: Medicare Other

## 2019-11-16 ENCOUNTER — Ambulatory Visit: Payer: Medicare PPO | Attending: Internal Medicine

## 2019-11-16 DIAGNOSIS — Z23 Encounter for immunization: Secondary | ICD-10-CM

## 2019-11-16 NOTE — Progress Notes (Signed)
   Covid-19 Vaccination Clinic  Name:  [name] [name]    MRN: [id number] DOB: [birth year]  11/16/2019  [name] [name] was observed post Covid-19 immunization for 30 minutes based on pre-vaccination screening without incident. She was provided with Vaccine Information Sheet and instruction to access the V-Safe system.   [name] [name] was instructed to call 911 with any severe reactions post vaccine: Marland Kitchen Difficulty breathing  . Swelling of face and throat  . A fast heartbeat  . A bad rash all over body  . Dizziness and weakness   Immunizations Administered    Name Date Dose VIS Date Route   Pfizer COVID-19 Vaccine 11/16/2019  9:13 AM 0.3 mL 08/07/2019 Intramuscular   Manufacturer: [employer], [employer]   Lot: [id number]   NDC: [address]-4

## 2019-12-09 ENCOUNTER — Ambulatory Visit: Payer: Medicare PPO | Attending: Internal Medicine

## 2019-12-09 DIAGNOSIS — Z23 Encounter for immunization: Secondary | ICD-10-CM

## 2019-12-09 NOTE — Progress Notes (Signed)
   Covid-19 Vaccination Clinic  Name:  [name] [name]    MRN: [id number] DOB: Jan 19, 1936  12/09/2019  [name] [name] was observed post Covid-19 immunization for 30 minutes based on pre-vaccination screening without incident. She was provided with Vaccine Information Sheet and instruction to access the V-Safe system.   [name] [name] was instructed to call 911 with any severe reactions post vaccine: Marland Kitchen Difficulty breathing  . Swelling of face and throat  . A fast heartbeat  . A bad rash all over body  . Dizziness and weakness   Immunizations Administered    Name Date Dose VIS Date Route   Pfizer COVID-19 Vaccine 12/09/2019 10:07 AM 0.3 mL 08/07/2019 Intramuscular   Manufacturer: [employer], [employer]   Lot: [id number]   NDC: [address]-4

## 2020-02-23 ENCOUNTER — Other Ambulatory Visit: Payer: Self-pay

## 2020-02-23 ENCOUNTER — Emergency Department (HOSPITAL_COMMUNITY)
Admission: EM | Admit: 2020-02-23 | Discharge: 2020-02-23 | Disposition: A | Discharge status: Home / Self Care | Payer: Medicare PPO | Attending: Emergency Medicine | Admitting: Emergency Medicine

## 2020-02-23 ENCOUNTER — Encounter (HOSPITAL_COMMUNITY): Payer: Self-pay

## 2020-02-23 ENCOUNTER — Emergency Department (HOSPITAL_COMMUNITY): Payer: Medicare PPO

## 2020-02-23 DIAGNOSIS — Z7984 Long term (current) use of oral hypoglycemic drugs: Secondary | ICD-10-CM | POA: Diagnosis not present

## 2020-02-23 DIAGNOSIS — Y9289 Other specified places as the place of occurrence of the external cause: Secondary | ICD-10-CM | POA: Insufficient documentation

## 2020-02-23 DIAGNOSIS — Z79899 Other long term (current) drug therapy: Secondary | ICD-10-CM | POA: Insufficient documentation

## 2020-02-23 DIAGNOSIS — E119 Type 2 diabetes mellitus without complications: Secondary | ICD-10-CM | POA: Insufficient documentation

## 2020-02-23 DIAGNOSIS — W01198A Fall on same level from slipping, tripping and stumbling with subsequent striking against other object, initial encounter: Secondary | ICD-10-CM | POA: Diagnosis not present

## 2020-02-23 DIAGNOSIS — I2510 Atherosclerotic heart disease of native coronary artery without angina pectoris: Secondary | ICD-10-CM | POA: Insufficient documentation

## 2020-02-23 DIAGNOSIS — Y9301 Activity, walking, marching and hiking: Secondary | ICD-10-CM | POA: Insufficient documentation

## 2020-02-23 DIAGNOSIS — I509 Heart failure, unspecified: Secondary | ICD-10-CM | POA: Diagnosis not present

## 2020-02-23 DIAGNOSIS — J45909 Unspecified asthma, uncomplicated: Secondary | ICD-10-CM | POA: Insufficient documentation

## 2020-02-23 DIAGNOSIS — R110 Nausea: Secondary | ICD-10-CM

## 2020-02-23 DIAGNOSIS — Y999 Unspecified external cause status: Secondary | ICD-10-CM | POA: Diagnosis not present

## 2020-02-23 DIAGNOSIS — R519 Headache, unspecified: Secondary | ICD-10-CM | POA: Insufficient documentation

## 2020-02-23 DIAGNOSIS — I110 Hypertensive heart disease with heart failure: Secondary | ICD-10-CM | POA: Diagnosis not present

## 2020-02-23 DIAGNOSIS — M542 Cervicalgia: Secondary | ICD-10-CM | POA: Insufficient documentation

## 2020-02-23 DIAGNOSIS — R42 Dizziness and giddiness: Secondary | ICD-10-CM | POA: Diagnosis present

## 2020-02-23 DIAGNOSIS — W19XXXA Unspecified fall, initial encounter: Secondary | ICD-10-CM

## 2020-02-23 LAB — CBC WITH DIFFERENTIAL/PLATELET
Abs Immature Granulocytes: 0.02 10*3/uL (ref 0.00–0.07)
Basophils Absolute: 0 10*3/uL (ref 0.0–0.1)
Basophils Relative: 1 %
Eosinophils Absolute: 0.2 10*3/uL (ref 0.0–0.5)
Eosinophils Relative: 3 %
HCT: 40.7 % (ref 36.0–46.0)
Hemoglobin: 12.7 g/dL (ref 12.0–15.0)
Immature Granulocytes: 0 %
Lymphocytes Relative: 27 %
Lymphs Abs: 2.2 10*3/uL (ref 0.7–4.0)
MCH: 28.5 pg (ref 26.0–34.0)
MCHC: 31.2 g/dL (ref 30.0–36.0)
MCV: 91.3 fL (ref 80.0–100.0)
Monocytes Absolute: 0.8 10*3/uL (ref 0.1–1.0)
Monocytes Relative: 10 %
Neutro Abs: 4.6 10*3/uL (ref 1.7–7.7)
Neutrophils Relative %: 59 %
Platelets: 282 10*3/uL (ref 150–400)
RBC: 4.46 MIL/uL (ref 3.87–5.11)
RDW: 14 % (ref 11.5–15.5)
WBC: 7.8 10*3/uL (ref 4.0–10.5)
nRBC: 0 % (ref 0.0–0.2)

## 2020-02-23 LAB — COMPREHENSIVE METABOLIC PANEL
ALT: 17 U/L (ref 0–44)
AST: 20 U/L (ref 15–41)
Albumin: 3.3 g/dL — ABNORMAL LOW (ref 3.5–5.0)
Alkaline Phosphatase: 73 U/L (ref 38–126)
Anion gap: 8 (ref 5–15)
BUN: 18 mg/dL (ref 8–23)
CO2: 28 mmol/L (ref 22–32)
Calcium: 8.9 mg/dL (ref 8.9–10.3)
Chloride: 104 mmol/L (ref 98–111)
Creatinine, Ser: 0.89 mg/dL (ref 0.44–1.00)
GFR calc Af Amer: >60 mL/min (ref >60)
GFR calc non Af Amer: >60 mL/min (ref >60)
Glucose, Bld: 159 mg/dL — ABNORMAL HIGH (ref 70–99)
Potassium: 4.3 mmol/L (ref 3.5–5.1)
Sodium: 140 mmol/L (ref 135–145)
Total Bilirubin: 0.7 mg/dL (ref 0.3–1.2)
Total Protein: 6.8 g/dL (ref 6.5–8.1)

## 2020-02-23 LAB — URINALYSIS, ROUTINE W REFLEX MICROSCOPIC
Bilirubin Urine: NEGATIVE
Glucose, UA: NEGATIVE mg/dL
Hgb urine dipstick: NEGATIVE
Ketones, ur: NEGATIVE mg/dL
Nitrite: NEGATIVE
Protein, ur: NEGATIVE mg/dL
Specific Gravity, Urine: 1.019 (ref 1.005–1.030)
pH: 5 (ref 5.0–8.0)

## 2020-02-23 LAB — LIPASE, BLOOD: Lipase: 22 U/L (ref 11–51)

## 2020-02-23 MED ORDER — SODIUM CHLORIDE 0.9 % IV BOLUS
1000.0000 mL | Freq: Once | INTRAVENOUS | Status: AC
  Administered 2020-02-23: 15:00:00 1000 mL via INTRAVENOUS

## 2020-02-23 MED ORDER — DIAZEPAM 2 MG PO TABS
2.0000 mg | ORAL_TABLET | Freq: Once | ORAL | Status: AC
  Administered 2020-02-23: 15:00:00 2 mg via ORAL
  Filled 2020-02-23: qty 1

## 2020-02-23 NOTE — ED Provider Notes (Signed)
[name]-EMERGENCY DEPT Provider Note   CSN: [id number] Arrival date & time: 02/23/20  1235     History Chief Complaint  Patient presents with  . Fall  . Head Injury    [name] [name] is a 84 y.o. female with past medical history significant for vertigo, asthma, CAD, CHF, diabetes who presents for evaluation of fall.  She states she had just started walking with her sister when she felt like the room spun and she fell.  She denies syncopal episode.  Patient states she hit the side of her head on the ground.  Patient states since then she has had a sore sensation to the right side of her head and right side of her neck.  Patient states she has had some nausea today.  Patient states additionally when she woke up this morning when she went from lying flat to standing she felt lightheaded.  Patient unable to describe her type of lightheadedness versus dizziness.  Has history of vertigo and states she is unsure if this feels similar.  She denies any preceding sudden onset medical headache, chest pain, shortness of breath, paresthesias.  No unilateral weakness, facial droop, difficulty with word finding.  Denies visual field cuts, double vision, blurred vision, abdominal pain.  Denies additional aggravating or relieving factors. She has been ambulatory since the fall. Fall occurred approximately 24 hours ago.  Hx of falls  History obtained from patient and past medical records.  No interpreter used.  HPI     Past Medical History:  Diagnosis Date  . Anxiety   . Asthma   . Benign paroxysmal positional vertigo 11/05/2012  . CAD (coronary artery disease)   . Cervical spondylosis 03/26/2012  . CHF (congestive heart failure) (HCC)   . Complication of anesthesia    AGE 33 APPENDECTOMY WOKE UP, NO TROUBLE SINCE  . Diabetes mellitus   . Difficult intubation    no mri,  has pins in ears  . GERD (gastroesophageal reflux disease)   . Hypertension   . Obesity   .  Shortness of breath     Patient Active Problem List   Diagnosis Date Noted  . Primary localized osteoarthritis of right knee 09/30/2015  . Atypical chest pain   . Right knee pain 11/17/2014  . Osteoarthritis 09/16/2014  . Fall at home 09/16/2014  . Acute low back pain 08/30/2014  . Skin abnormality 07/30/2013  . Hypercalcemia 04/08/2013  . Lumbar spondylosis with myelopathy 06/24/2012  . Preventative health care 06/24/2012  . Cervical spondylosis 03/26/2012  . CHRONIC RHINITIS 06/02/2010  . Type 2 diabetes mellitus, controlled (HCC) 06/22/2009  . Hyperlipidemia 06/22/2009  . Essential hypertension 06/22/2009  . RESPIRATORY FAILURE, CHRONIC 06/07/2009  . GERD 06/07/2009  . Asthma 01/07/2008    Past Surgical History:  Procedure Laterality Date  . ABDOMINAL HYSTERECTOMY    . APPENDECTOMY    . CATARACT EXTRACTION W/ INTRAOCULAR LENS  IMPLANT, BILATERAL    . CESAREAN SECTION    . CHOLECYSTECTOMY    . ear pins bilaterally    . LUMBAR LAMINECTOMY  2002  . ROTATOR CUFF REPAIR  2003   Right    (X2)  . TOTAL KNEE ARTHROPLASTY Right 09/30/2015   Procedure: RIGHT TOTAL KNEE ARTHROPLASTY;  Surgeon: Frederico Hamman, MD;  Location: [facility] OR;  Service: Orthopedics;  Laterality: Right;     OB History   No obstetric history on file.     Family History  Problem Relation Age of Onset  .  Cancer Mother        Question pancreatic  . Coronary artery disease Father   . Breast cancer Sister 32  . Coronary artery disease Sister 38  . Breast cancer Cousin   . Colon cancer Neg Hx     Social History   Tobacco Use  . Smoking status: Never Smoker  . Smokeless tobacco: Never Used  Substance Use Topics  . Alcohol use: No    Alcohol/week: 0.0 standard drinks  . Drug use: No    Home Medications Prior to Admission medications   Medication Sig Start Date End Date Taking? Authorizing Provider  albuterol (PROVENTIL HFA) 108 (90 BASE) MCG/ACT inhaler Inhale 2 puffs into the lungs every 6 (six)  hours as needed for shortness of breath. 01/12/15  Yes Doneen Poisson, MD  clonazePAM (KLONOPIN) 1 MG tablet Take 1 mg by mouth 2 (two) times daily as needed for anxiety.    Yes [provider]  docusate sodium (STOOL SOFTENER) 100 MG capsule Take 1 capsule (100 mg total) by mouth daily as needed for mild constipation or moderate constipation. 08/04/14  Yes Margarito Liner, MD  DULoxetine (CYMBALTA) 60 MG capsule Take 60 mg by mouth daily. 11/29/19  Yes [provider]  gabapentin (NEURONTIN) 300 MG capsule Take 300 mg by mouth at bedtime. 02/09/15  Yes [provider]  metFORMIN (GLUCOPHAGE) 500 MG tablet Take 500 mg by mouth 2 (two) times daily with a meal.    Yes [provider]  NOVOLOG MIX 70/30 FLEXPEN (70-30) 100 UNIT/ML SUPN Inject 17 Units into the skin daily with supper.  02/20/13  Yes [provider]  olopatadine (PATADAY) 0.1 % ophthalmic solution Place 1 drop into both eyes 2 (two) times daily.   Yes [provider]  simvastatin (ZOCOR) 40 MG tablet Take 40 mg by mouth at bedtime.   Yes [provider]  SYMBICORT 80-4.5 MCG/ACT inhaler Inhale 1 puff into the lungs 2 (two) times daily. 10/12/14  Yes [provider]  valsartan-hydrochlorothiazide (DIOVAN-HCT) 160-12.5 MG per tablet Take 1 tablet by mouth daily.   Yes [provider]  Vitamin D, Ergocalciferol, (DRISDOL) 1.25 MG (50000 UNIT) CAPS capsule Take 50,000 Units by mouth once a week. 10/04/19  Yes [provider]    Allergies    Contrast media [iodinated diagnostic agents], Red dye, Sulfa antibiotics, Aspirin, and Oxycodone-acetaminophen  Review of Systems   Review of Systems  Constitutional: Negative.   HENT: Negative.   Respiratory: Negative.   Cardiovascular: Negative.   Gastrointestinal: Positive for nausea. Negative for abdominal distention, abdominal pain, anal bleeding, blood in stool, constipation, diarrhea, rectal pain and vomiting.    Genitourinary: Negative.   Musculoskeletal: Positive for neck pain (Right neck pain). Negative for arthralgias, back pain, gait problem, joint swelling and myalgias.  Skin: Negative.   Neurological: Positive for dizziness and headaches (Righ head pain after fall). Negative for tremors, seizures, syncope, speech difficulty, weakness, light-headedness and numbness.  All other systems reviewed and are negative.   Physical Exam Updated Vital Signs BP 125/73 (BP Location: Right Arm)   Pulse 83   Temp 98.3 F (36.8 C) (Oral)   Resp 17   SpO2 97%   Physical Exam Physical Exam  Constitutional: Pt is oriented to person, place, and time. Pt appears well-developed and well-nourished. No distress.  HENT:  Head: Normocephalic and atraumatic.  Mild tenderness to right orbital region.  No overlying skin changes Mouth/Throat: Oropharynx is clear and moist.  Eyes: Conjunctivae  and EOM are normal. Pupils are equal, round, and reactive to light. No scleral icterus.  No horizontal, vertical or rotational nystagmus  Neck: Normal range of motion. Neck supple.  Full active and passive ROM without pain No midline or paraspinal tenderness No nuchal rigidity or meningeal signs  Cardiovascular: Normal rate, regular rhythm and intact distal pulses.   Pulmonary/Chest: Effort normal and breath sounds normal. No respiratory distress. Pt has no wheezes. No rales.  Abdominal: Soft. Bowel sounds are normal. There is no tenderness. There is no rebound and no guarding.  Musculoskeletal: Normal range of motion.  Lymphadenopathy:    No cervical adenopathy.  Neurological: Pt. is alert and oriented to person, place, and time. He has normal reflexes. No cranial nerve deficit.  Exhibits normal muscle tone. Coordination normal.  Mental Status:  Alert, oriented, thought content appropriate. Speech fluent without evidence of aphasia. Able to follow 2 step commands without difficulty.  Cranial Nerves:  II:  Peripheral  visual fields grossly normal, pupils equal, round, reactive to light III,IV, VI: ptosis not present, extra-ocular motions intact bilaterally  V,VII: smile symmetric, facial light touch sensation equal VIII: hearing grossly normal bilaterally  IX,X: midline uvula rise  XI: bilateral shoulder shrug equal and strong XII: midline tongue extension  Motor:  5/5 in upper and lower extremities bilaterally including strong and equal grip strength and dorsiflexion/plantar flexion Sensory: Pinprick and light touch normal in all extremities.  Deep Tendon Reflexes: 2+ and symmetric  Cerebellar: normal finger-to-nose with bilateral upper extremities Gait: normal gait and balance CV: distal pulses palpable throughout   Skin: Skin is warm and dry. No rash noted. Pt is not diaphoretic.  Psychiatric: Pt has a normal mood and affect. Behavior is normal. Judgment and thought content normal.  Nursing note and vitals reviewed. ED Results / Procedures / Treatments   Labs (all labs ordered are listed, but only abnormal results are displayed) Labs Reviewed  CBC WITH DIFFERENTIAL/PLATELET  URINALYSIS, ROUTINE W REFLEX MICROSCOPIC  COMPREHENSIVE METABOLIC PANEL  LIPASE, BLOOD    EKG EKG Interpretation  Date/Time:  Tuesday February 23 2020 13:53:11 EDT Ventricular Rate:  76 PR Interval:    QRS Duration: 73 QT Interval:  432 QTC Calculation: 486 R Axis:   -13 Text Interpretation: Sinus rhythm Borderline T abnormalities, anterior leads Borderline prolonged QT interval Confirmed by Meridee ScoreButler, Michael [id number]) on 02/23/2020 2:07:12 PM   Radiology CT Head Wo Contrast  Result Date: 02/23/2020 CLINICAL DATA:  Syncope, fall, head injury EXAM: CT HEAD WITHOUT CONTRAST TECHNIQUE: Contiguous axial images were obtained from the base of the skull through the vertex without intravenous contrast. COMPARISON:  01/07/2011 FINDINGS: Brain: There is atrophy and chronic small vessel disease changes. No acute intracranial  abnormality. Specifically, no hemorrhage, hydrocephalus, mass lesion, acute infarction, or significant intracranial injury. Vascular: No hyperdense vessel or unexpected calcification. Skull: No acute calvarial abnormality. Sinuses/Orbits: Visualized paranasal sinuses and mastoids clear. Orbital soft tissues unremarkable. Other: None IMPRESSION: Atrophy, chronic microvascular disease. No acute intracranial abnormality. Electronically Signed   By: Charlett NoseKevin  Dover M.D.   On: 02/23/2020 15:15   CT Cervical Spine Wo Contrast  Result Date: 02/23/2020 CLINICAL DATA:  Syncope, fall EXAM: CT CERVICAL SPINE WITHOUT CONTRAST TECHNIQUE: Multidetector CT imaging of the cervical spine was performed without intravenous contrast. Multiplanar CT image reconstructions were also generated. COMPARISON:  None. FINDINGS: Alignment: No subluxation. Skull base and vertebrae: No acute fracture. No primary bone lesion or focal pathologic process. Soft tissues and spinal canal: No prevertebral fluid  or swelling. No visible canal hematoma. Disc levels: Diffuse degenerative disc disease, most pronounced from C4-5 through C6-7. Diffuse advanced degenerative facet disease bilaterally. Upper chest: Negative acute. Other: None IMPRESSION: Diffuse degenerative disc and facet disease. No acute bony abnormality. Electronically Signed   By: Charlett Nose M.D.   On: 02/23/2020 15:16    Procedures Procedures (including critical care time)  Medications Ordered in ED Medications  sodium chloride 0.9 % bolus 1,000 mL (1,000 mLs Intravenous New Bag/Given (Non-Interop) 02/23/20 1437)  diazepam (VALIUM) tablet 2 mg (2 mg Oral Given 02/23/20 1438)    ED Course  I have reviewed the triage vital signs and the nursing notes.  Pertinent labs & imaging results that were available during my care of the patient were reviewed by me and considered in my medical decision making (see chart for details).  84 year old presents for evaluation of dizzy episode  and subsequent fall which occurred 24 hours PTA.  Afebrile, nonseptic, not ill-appearing.  Additional lightheaded episode when she went from lying flat to standing earlier today has history of vertigo.  Patient does not describe her dizziness or lightheadedness very well.  Patient is nonfocal neuro exam without deficits.  Has had some nausea.  No paresthesias, weakness, facial droop, difficulty with word finding.  No preceding sudden onset thunderclap headache, neck pain, chest pain, shortness of breath.  Heart and lungs clear.  Abdomen soft, nontender.  She is been ambulatory since the incident and tolerating p.o. intake.  She is without tachycardia, tachypnea or hypoxia.  Plan on labs, imaging and reassess  Labs imaging personally reviewed and interpreted: CBC without leukocytosis CMP pending Lipase pending UA pending EKG without STEMI, borderline QTc CT head without changes CT cervical without acute changes Orthostatic VS with 20 systolic drop. Ha snot gotten IVF. Plan to reassess after IVF.  Patient reassessed.  Feels significantly improved after Valium and fluids.  Likely fall multifactorial.  Unfortunately patient's metabolic panel, lipase clotted and need to be redrawn.  Discussed remaining labs and p.o. challenge and likely dc home. Family are agreeable to this.  Care transferred to [facility] who ill follow up on remaining labs, UA and determine disposition. Likely dc home if labs WNL.   Clinical Course as of Feb 23 1539  Tue Feb 23, 2020  [id number] 84 year old female here after a fall yesterday striking her head.  She states she falls fairly often although yesterday was a hard 1.  She called her primary care doctor and they recommended she come here for evaluation.  She is getting some lab work head CT.  Likely discharge if no significant findings.   [MB]    Clinical Course User Index [MB] Terrilee Files, MD   MDM Rules/Calculators/A&P                           Final Clinical  Impression(s) / ED Diagnoses Final diagnoses:  Fall, initial encounter  Lightheadedness  Nausea    Rx / DC Orders ED Discharge Orders    None       Krissy Orebaugh A, PA-C 02/23/20 1540    Terrilee Files, MD 02/23/20 1758

## 2020-02-23 NOTE — ED Triage Notes (Signed)
Pt presents with c/o fall and head injury. Pt reports she hit her head on the ground/grass yesterday. Pt reports that since then, she has been feeling sore in that area. Pt reports some associated nausea today and feeling "fuzzy".

## 2020-02-23 NOTE — ED Provider Notes (Signed)
Care assumed at shift change from PA Henderly.  See her note for full H&P.  LH, fall, nausea. No injuries. pending labs. CBC wnl. CT neg. Orthostatics pos, IVF. Valium for vertigo? Pending abd labs, u/a. Need to ambulate.  Physical Exam  BP (!) 167/82 (BP Location: Left Arm)   Pulse 60   Temp 98.3 F (36.8 C) (Oral)   Resp 16   SpO2 99%   Physical Exam Vitals and nursing note reviewed.  Constitutional:      General: She is not in acute distress.    Appearance: She is well-developed.  HENT:     Head: Normocephalic and atraumatic.  Eyes:     Conjunctiva/sclera: Conjunctivae normal.  Cardiovascular:     Rate and Rhythm: Normal rate.  Pulmonary:     Effort: Pulmonary effort is normal.  Neurological:     Mental Status: She is alert.  Psychiatric:        Mood and Affect: Mood normal.        Behavior: Behavior normal.     ED Course/Procedures   Clinical Course as of Feb 23 1840  Tue Feb 23, 2020  [id number] 84 year old female here after a fall yesterday striking her head.  She states she falls fairly often although yesterday was a hard 1.  She called her primary care doctor and they recommended she come here for evaluation.  She is getting some lab work head CT.  Likely discharge if no significant findings.   [MB]    Clinical Course User Index [MB] Terrilee Files, MD    Procedures Results for orders placed or performed during the hospital encounter of 02/23/20  CBC with Differential  Result Value Ref Range   WBC 7.8 4.0 - 10.5 K/uL   RBC 4.46 3.87 - 5.11 MIL/uL   Hemoglobin 12.7 12.0 - 15.0 g/dL   HCT 16.1 36 - 46 %   MCV 91.3 80.0 - 100.0 fL   MCH 28.5 26.0 - 34.0 pg   MCHC 31.2 30.0 - 36.0 g/dL   RDW 09.6 04.5 - 40.9 %   Platelets 282 150 - 400 K/uL   nRBC 0.0 0.0 - 0.2 %   Neutrophils Relative % 59 %   Neutro Abs 4.6 1.7 - 7.7 K/uL   Lymphocytes Relative 27 %   Lymphs Abs 2.2 0.7 - 4.0 K/uL   Monocytes Relative 10 %   Monocytes Absolute 0.8 0 - 1 K/uL    Eosinophils Relative 3 %   Eosinophils Absolute 0.2 0 - 0 K/uL   Basophils Relative 1 %   Basophils Absolute 0.0 0 - 0 K/uL   Immature Granulocytes 0 %   Abs Immature Granulocytes 0.02 0.00 - 0.07 K/uL  Urinalysis, Routine w reflex microscopic  Result Value Ref Range   Color, Urine YELLOW YELLOW   APPearance HAZY (A) CLEAR   Specific Gravity, Urine 1.019 1.005 - 1.030   pH 5.0 5.0 - 8.0   Glucose, UA NEGATIVE NEGATIVE mg/dL   Hgb urine dipstick NEGATIVE NEGATIVE   Bilirubin Urine NEGATIVE NEGATIVE   Ketones, ur NEGATIVE NEGATIVE mg/dL   Protein, ur NEGATIVE NEGATIVE mg/dL   Nitrite NEGATIVE NEGATIVE   Leukocytes,Ua TRACE (A) NEGATIVE   RBC / HPF 0-5 0 - 5 RBC/hpf   WBC, UA 0-5 0 - 5 WBC/hpf   Bacteria, UA RARE (A) NONE SEEN   Squamous Epithelial / LPF 6-10 0 - 5   Mucus PRESENT   Comprehensive metabolic panel  Result Value Ref  Range   Sodium 140 135 - 145 mmol/L   Potassium 4.3 3.5 - 5.1 mmol/L   Chloride 104 98 - 111 mmol/L   CO2 28 22 - 32 mmol/L   Glucose, Bld 159 (H) 70 - 99 mg/dL   BUN 18 8 - 23 mg/dL   Creatinine, Ser 3.61 0.44 - 1.00 mg/dL   Calcium 8.9 8.9 - 44.3 mg/dL   Total Protein 6.8 6.5 - 8.1 g/dL   Albumin 3.3 (L) 3.5 - 5.0 g/dL   AST 20 15 - 41 U/L   ALT 17 0 - 44 U/L   Alkaline Phosphatase 73 38 - 126 U/L   Total Bilirubin 0.7 0.3 - 1.2 mg/dL   GFR calc non Af Amer >60 >60 mL/min   GFR calc Af Amer >60 >60 mL/min   Anion gap 8 5 - 15  Lipase, blood  Result Value Ref Range   Lipase 22 11 - 51 U/L   CT Head Wo Contrast  Result Date: 02/23/2020 CLINICAL DATA:  Syncope, fall, head injury EXAM: CT HEAD WITHOUT CONTRAST TECHNIQUE: Contiguous axial images were obtained from the base of the skull through the vertex without intravenous contrast. COMPARISON:  01/07/2011 FINDINGS: Brain: There is atrophy and chronic small vessel disease changes. No acute intracranial abnormality. Specifically, no hemorrhage, hydrocephalus, mass lesion, acute infarction, or  significant intracranial injury. Vascular: No hyperdense vessel or unexpected calcification. Skull: No acute calvarial abnormality. Sinuses/Orbits: Visualized paranasal sinuses and mastoids clear. Orbital soft tissues unremarkable. Other: None IMPRESSION: Atrophy, chronic microvascular disease. No acute intracranial abnormality. Electronically Signed   By: Charlett Nose M.D.   On: 02/23/2020 15:15   CT Cervical Spine Wo Contrast  Result Date: 02/23/2020 CLINICAL DATA:  Syncope, fall EXAM: CT CERVICAL SPINE WITHOUT CONTRAST TECHNIQUE: Multidetector CT imaging of the cervical spine was performed without intravenous contrast. Multiplanar CT image reconstructions were also generated. COMPARISON:  None. FINDINGS: Alignment: No subluxation. Skull base and vertebrae: No acute fracture. No primary bone lesion or focal pathologic process. Soft tissues and spinal canal: No prevertebral fluid or swelling. No visible canal hematoma. Disc levels: Diffuse degenerative disc disease, most pronounced from C4-5 through C6-7. Diffuse advanced degenerative facet disease bilaterally. Upper chest: Negative acute. Other: None IMPRESSION: Diffuse degenerative disc and facet disease. No acute bony abnormality. Electronically Signed   By: Charlett Nose M.D.   On: 02/23/2020 15:16    MDM  Labs are very reassuring.  UA appears to be contaminated specimen,  not consistent with infection.  Culture sent.  Patient states she ambulated in the ED and is feeling better.  Recommend monitor BP at home follow-up with PCP.  Patient is appropriate for discharge with outpatient follow-up.  Return precautions discussed.      Luisfernando Brightwell, [place] N, PA-C 02/23/20 1842    Terrilee Files, MD 02/24/20 2008

## 2020-02-23 NOTE — Discharge Instructions (Signed)
Your blood work and urinalysis are very reassuring. Your CT scans show no evidence of injury. Your symptoms are likely due to low blood pressure. Please monitor your blood pressure at home, stay hydrated. Follow up closely with your primary care provider regarding your visit today. Return the emergency department for new or worsening symptoms.

## 2020-02-25 LAB — URINE CULTURE: Culture: 20000 — AB

## 2020-05-17 ENCOUNTER — Other Ambulatory Visit: Payer: Self-pay | Admitting: Physical Medicine and Rehabilitation

## 2020-05-17 DIAGNOSIS — M545 Low back pain, unspecified: Secondary | ICD-10-CM

## 2020-06-09 ENCOUNTER — Ambulatory Visit
Admission: RE | Admit: 2020-06-09 | Discharge: 2020-06-09 | Disposition: A | Discharge status: Home / Self Care | Payer: Medicare PPO | Source: Ambulatory Visit | Attending: Physical Medicine and Rehabilitation | Admitting: Physical Medicine and Rehabilitation

## 2020-06-09 DIAGNOSIS — M545 Low back pain, unspecified: Secondary | ICD-10-CM

## 2021-02-01 ENCOUNTER — Other Ambulatory Visit: Payer: Self-pay | Admitting: Endocrinology

## 2021-02-01 DIAGNOSIS — R1010 Upper abdominal pain, unspecified: Secondary | ICD-10-CM

## 2022-01-02 DIAGNOSIS — K219 Gastro-esophageal reflux disease without esophagitis: Secondary | ICD-10-CM | POA: Diagnosis not present

## 2022-01-02 DIAGNOSIS — E669 Obesity, unspecified: Secondary | ICD-10-CM | POA: Diagnosis not present

## 2022-01-02 DIAGNOSIS — F324 Major depressive disorder, single episode, in partial remission: Secondary | ICD-10-CM | POA: Diagnosis not present

## 2022-01-02 DIAGNOSIS — E785 Hyperlipidemia, unspecified: Secondary | ICD-10-CM | POA: Diagnosis not present

## 2022-01-02 DIAGNOSIS — I10 Essential (primary) hypertension: Secondary | ICD-10-CM | POA: Diagnosis not present

## 2022-01-02 DIAGNOSIS — E1142 Type 2 diabetes mellitus with diabetic polyneuropathy: Secondary | ICD-10-CM | POA: Diagnosis not present

## 2022-01-02 DIAGNOSIS — Z794 Long term (current) use of insulin: Secondary | ICD-10-CM | POA: Diagnosis not present

## 2022-01-02 DIAGNOSIS — F419 Anxiety disorder, unspecified: Secondary | ICD-10-CM | POA: Diagnosis not present

## 2022-01-02 DIAGNOSIS — J45909 Unspecified asthma, uncomplicated: Secondary | ICD-10-CM | POA: Diagnosis not present

## 2022-02-14 DIAGNOSIS — N1831 Chronic kidney disease, stage 3a: Secondary | ICD-10-CM | POA: Diagnosis not present

## 2022-02-14 DIAGNOSIS — I2510 Atherosclerotic heart disease of native coronary artery without angina pectoris: Secondary | ICD-10-CM | POA: Diagnosis not present

## 2022-02-14 DIAGNOSIS — D500 Iron deficiency anemia secondary to blood loss (chronic): Secondary | ICD-10-CM | POA: Diagnosis not present

## 2022-02-14 DIAGNOSIS — E1140 Type 2 diabetes mellitus with diabetic neuropathy, unspecified: Secondary | ICD-10-CM | POA: Diagnosis not present

## 2022-02-14 DIAGNOSIS — M48061 Spinal stenosis, lumbar region without neurogenic claudication: Secondary | ICD-10-CM | POA: Diagnosis not present

## 2022-02-14 DIAGNOSIS — J441 Chronic obstructive pulmonary disease with (acute) exacerbation: Secondary | ICD-10-CM | POA: Diagnosis not present

## 2022-02-14 DIAGNOSIS — F330 Major depressive disorder, recurrent, mild: Secondary | ICD-10-CM | POA: Diagnosis not present

## 2022-02-14 DIAGNOSIS — I679 Cerebrovascular disease, unspecified: Secondary | ICD-10-CM | POA: Diagnosis not present

## 2022-02-14 DIAGNOSIS — E559 Vitamin D deficiency, unspecified: Secondary | ICD-10-CM | POA: Diagnosis not present

## 2022-02-14 DIAGNOSIS — I129 Hypertensive chronic kidney disease with stage 1 through stage 4 chronic kidney disease, or unspecified chronic kidney disease: Secondary | ICD-10-CM | POA: Diagnosis not present

## 2022-02-14 DIAGNOSIS — E785 Hyperlipidemia, unspecified: Secondary | ICD-10-CM | POA: Diagnosis not present

## 2022-02-14 DIAGNOSIS — J45909 Unspecified asthma, uncomplicated: Secondary | ICD-10-CM | POA: Diagnosis not present

## 2022-02-14 DIAGNOSIS — K5901 Slow transit constipation: Secondary | ICD-10-CM | POA: Diagnosis not present

## 2022-02-14 DIAGNOSIS — Z8544 Personal history of malignant neoplasm of other female genital organs: Secondary | ICD-10-CM | POA: Diagnosis not present

## 2022-03-06 ENCOUNTER — Emergency Department (HOSPITAL_BASED_OUTPATIENT_CLINIC_OR_DEPARTMENT_OTHER): Payer: Medicare PPO

## 2022-03-06 ENCOUNTER — Other Ambulatory Visit: Payer: Self-pay

## 2022-03-06 ENCOUNTER — Emergency Department (HOSPITAL_BASED_OUTPATIENT_CLINIC_OR_DEPARTMENT_OTHER): Payer: Medicare PPO | Admitting: Radiology

## 2022-03-06 ENCOUNTER — Encounter (HOSPITAL_BASED_OUTPATIENT_CLINIC_OR_DEPARTMENT_OTHER): Payer: Self-pay | Admitting: Obstetrics and Gynecology

## 2022-03-06 ENCOUNTER — Emergency Department (HOSPITAL_BASED_OUTPATIENT_CLINIC_OR_DEPARTMENT_OTHER)
Admission: EM | Admit: 2022-03-06 | Discharge: 2022-03-07 | Disposition: A | Discharge status: Home / Self Care | Payer: Medicare PPO | Attending: Emergency Medicine | Admitting: Emergency Medicine

## 2022-03-06 DIAGNOSIS — S82142A Displaced bicondylar fracture of left tibia, initial encounter for closed fracture: Secondary | ICD-10-CM

## 2022-03-06 DIAGNOSIS — S82125A Nondisplaced fracture of lateral condyle of left tibia, initial encounter for closed fracture: Secondary | ICD-10-CM | POA: Diagnosis not present

## 2022-03-06 DIAGNOSIS — R519 Headache, unspecified: Secondary | ICD-10-CM | POA: Insufficient documentation

## 2022-03-06 DIAGNOSIS — S0990XA Unspecified injury of head, initial encounter: Secondary | ICD-10-CM | POA: Diagnosis not present

## 2022-03-06 DIAGNOSIS — M545 Low back pain, unspecified: Secondary | ICD-10-CM | POA: Insufficient documentation

## 2022-03-06 DIAGNOSIS — E119 Type 2 diabetes mellitus without complications: Secondary | ICD-10-CM | POA: Insufficient documentation

## 2022-03-06 DIAGNOSIS — W01198A Fall on same level from slipping, tripping and stumbling with subsequent striking against other object, initial encounter: Secondary | ICD-10-CM | POA: Insufficient documentation

## 2022-03-06 DIAGNOSIS — I10 Essential (primary) hypertension: Secondary | ICD-10-CM | POA: Insufficient documentation

## 2022-03-06 DIAGNOSIS — M12862 Other specific arthropathies, not elsewhere classified, left knee: Secondary | ICD-10-CM | POA: Diagnosis not present

## 2022-03-06 DIAGNOSIS — Z7984 Long term (current) use of oral hypoglycemic drugs: Secondary | ICD-10-CM | POA: Insufficient documentation

## 2022-03-06 DIAGNOSIS — M4802 Spinal stenosis, cervical region: Secondary | ICD-10-CM | POA: Diagnosis not present

## 2022-03-06 DIAGNOSIS — M160 Bilateral primary osteoarthritis of hip: Secondary | ICD-10-CM | POA: Diagnosis not present

## 2022-03-06 DIAGNOSIS — R202 Paresthesia of skin: Secondary | ICD-10-CM | POA: Diagnosis not present

## 2022-03-06 DIAGNOSIS — M47812 Spondylosis without myelopathy or radiculopathy, cervical region: Secondary | ICD-10-CM | POA: Diagnosis not present

## 2022-03-06 DIAGNOSIS — T1490XA Injury, unspecified, initial encounter: Secondary | ICD-10-CM | POA: Diagnosis not present

## 2022-03-06 DIAGNOSIS — Z79899 Other long term (current) drug therapy: Secondary | ICD-10-CM | POA: Insufficient documentation

## 2022-03-06 DIAGNOSIS — R739 Hyperglycemia, unspecified: Secondary | ICD-10-CM | POA: Diagnosis not present

## 2022-03-06 DIAGNOSIS — W19XXXA Unspecified fall, initial encounter: Secondary | ICD-10-CM | POA: Diagnosis not present

## 2022-03-06 DIAGNOSIS — Z043 Encounter for examination and observation following other accident: Secondary | ICD-10-CM | POA: Diagnosis not present

## 2022-03-06 LAB — BASIC METABOLIC PANEL
Anion gap: 8 (ref 5–15)
BUN: 18 mg/dL (ref 8–23)
CO2: 30 mmol/L (ref 22–32)
Calcium: 10.7 mg/dL — ABNORMAL HIGH (ref 8.9–10.3)
Chloride: 99 mmol/L (ref 98–111)
Creatinine, Ser: 0.8 mg/dL (ref 0.44–1.00)
GFR, Estimated: >60 mL/min (ref >60)
Glucose, Bld: 217 mg/dL — ABNORMAL HIGH (ref 70–99)
Potassium: 3.9 mmol/L (ref 3.5–5.1)
Sodium: 137 mmol/L (ref 135–145)

## 2022-03-06 LAB — CBC WITH DIFFERENTIAL/PLATELET
Abs Immature Granulocytes: 0.03 10*3/uL (ref 0.00–0.07)
Basophils Absolute: 0.1 10*3/uL (ref 0.0–0.1)
Basophils Relative: 1 %
Eosinophils Absolute: 0.2 10*3/uL (ref 0.0–0.5)
Eosinophils Relative: 2 %
HCT: 38.5 % (ref 36.0–46.0)
Hemoglobin: 12.2 g/dL (ref 12.0–15.0)
Immature Granulocytes: 0 %
Lymphocytes Relative: 27 %
Lymphs Abs: 2.3 10*3/uL (ref 0.7–4.0)
MCH: 28.7 pg (ref 26.0–34.0)
MCHC: 31.7 g/dL (ref 30.0–36.0)
MCV: 90.6 fL (ref 80.0–100.0)
Monocytes Absolute: 0.9 10*3/uL (ref 0.1–1.0)
Monocytes Relative: 10 %
Neutro Abs: 5.1 10*3/uL (ref 1.7–7.7)
Neutrophils Relative %: 60 %
Platelets: 295 10*3/uL (ref 150–400)
RBC: 4.25 MIL/uL (ref 3.87–5.11)
RDW: 13.4 % (ref 11.5–15.5)
WBC: 8.5 10*3/uL (ref 4.0–10.5)
nRBC: 0 % (ref 0.0–0.2)

## 2022-03-06 LAB — CBG MONITORING, ED: Glucose-Capillary: 202 mg/dL — ABNORMAL HIGH (ref 70–99)

## 2022-03-06 MED ORDER — HYDROCODONE-ACETAMINOPHEN 5-325 MG PO TABS
1.0000 | ORAL_TABLET | Freq: Once | ORAL | Status: DC
  Filled 2022-03-06: qty 1

## 2022-03-06 MED ORDER — ACETAMINOPHEN 325 MG PO TABS
650.0000 mg | ORAL_TABLET | Freq: Once | ORAL | Status: AC
  Administered 2022-03-06: 650 mg via ORAL
  Filled 2022-03-06: qty 2

## 2022-03-06 NOTE — Discharge Instructions (Addendum)
Please be nonweight bearing to the left lower extremity, utilize a walker for assistance with ambulation, and keep your knee immobilizer in place. Follow-up outpatient with orthopedics. Tylenol and ibuprofen for pain control.

## 2022-03-06 NOTE — ED Triage Notes (Signed)
Patient reports to the ER for a fall. Patient BUB EMS. Patient was at church in the bathroom and fell. Patient reports her left knee struck the ground and she hit her head. Patient reports no neck tenderness. Patient reports she cannot stand on the knee.

## 2022-03-06 NOTE — ED Provider Notes (Signed)
[facility] EMERGENCY DEPT Provider Note   CSN: [id number] Arrival date & time: 03/06/22  2017     History  Chief Complaint  Patient presents with   Marletta Lor    [name] [name] is a 86 y.o. female with history of hypertension, hyperlipidemia, type 2 diabetes, osteoarthritis presents after a ground-level fall while at church today.  The patient states that she was in the bathroom and attempting to stand up when her left knee gave out.  She states that she hit the left side of her head when she fell.  She denies loss of consciousness.  Afterwards she developed pain in her left knee anteriorly. Patient is complaining of a mild left-sided headache and mild bilateral lower back pain in the paraspinal region.  The patient states that her back pain is similar to back pain she has experienced in the past but worse and does not radiate.   Patient denies taking any blood thinners.  The patient denies dizziness, lightheadedness, neck pain, chest pain, palpitations, fever, chills, decreased appetite.  The history is provided by the patient.  Fall Associated symptoms include headaches. Pertinent negatives include no chest pain.       Home Medications Prior to Admission medications   Medication Sig Start Date End Date Taking? Authorizing Provider  albuterol (PROVENTIL HFA) 108 (90 BASE) MCG/ACT inhaler Inhale 2 puffs into the lungs every 6 (six) hours as needed for shortness of breath. 01/12/15   Doneen Poisson, MD  clonazePAM (KLONOPIN) 1 MG tablet Take 1 mg by mouth 2 (two) times daily as needed for anxiety.     [provider]  docusate sodium (STOOL SOFTENER) 100 MG capsule Take 1 capsule (100 mg total) by mouth daily as needed for mild constipation or moderate constipation. 08/04/14   Margarito Liner, MD  DULoxetine (CYMBALTA) 60 MG capsule Take 60 mg by mouth daily. 11/29/19   [provider]  gabapentin (NEURONTIN) 300 MG capsule Take 300 mg by mouth at bedtime.  02/09/15   [provider]  metFORMIN (GLUCOPHAGE) 500 MG tablet Take 500 mg by mouth 2 (two) times daily with a meal.     [provider]  NOVOLOG MIX 70/30 FLEXPEN (70-30) 100 UNIT/ML SUPN Inject 17 Units into the skin daily with supper.  02/20/13   [provider]  olopatadine (PATADAY) 0.1 % ophthalmic solution Place 1 drop into both eyes 2 (two) times daily.    [provider]  simvastatin (ZOCOR) 40 MG tablet Take 40 mg by mouth at bedtime.    [provider]  SYMBICORT 80-4.5 MCG/ACT inhaler Inhale 1 puff into the lungs 2 (two) times daily. 10/12/14   [provider]  valsartan-hydrochlorothiazide (DIOVAN-HCT) 160-12.5 MG per tablet Take 1 tablet by mouth daily.    [provider]  Vitamin D, Ergocalciferol, (DRISDOL) 1.25 MG (50000 UNIT) CAPS capsule Take 50,000 Units by mouth once a week. 10/04/19   [provider]      Allergies    Contrast media [iodinated contrast media], Red dye, Sulfa antibiotics, Aspirin, and Oxycodone-acetaminophen    Review of Systems   Review of Systems  Constitutional:  Negative for appetite change, chills and fever.  Cardiovascular:  Negative for chest pain and palpitations.  Musculoskeletal:  Positive for back pain. Negative for neck pain.       Left knee pain.   Neurological:  Positive for headaches. Negative for dizziness, syncope and light-headedness.    Physical Exam Updated Vital Signs BP (!) 148/71  Pulse 74   Temp 98.7 F (37.1 C) (Oral)   Resp 16   Ht 5\' 5"  (1.651 m)   Wt 81.6 kg   SpO2 97%   BMI 29.95 kg/m  Physical Exam Constitutional:      General: She is not in acute distress. HENT:     Head: Normocephalic.     Comments: No bruising, laceration, or active bleeding.  Cardiovascular:     Rate and Rhythm: Normal rate and regular rhythm.     Heart sounds: No murmur heard.    No friction rub. No gallop.  Pulmonary:     Effort: Pulmonary effort is normal.      Breath sounds: No wheezing, rhonchi or rales.  Musculoskeletal:     Cervical back: Normal range of motion. No tenderness.     Comments: Tenderness to palpation of the left knee at the tibial plateau. No effusion. Normal range of motion of the left knee. No midline spinal tenderness. No tenderness to palpation of the bilateral paraspinal regions.   Skin:    General: Skin is warm and dry.  Neurological:     Mental Status: She is alert.     ED Results / Procedures / Treatments   Labs (all labs ordered are listed, but only abnormal results are displayed) Labs Reviewed - No data to display  EKG None  Radiology DG Knee Complete 4 Views Left  Result Date: 03/06/2022 CLINICAL DATA:  Fall EXAM: LEFT KNEE - COMPLETE 4+ VIEW COMPARISON:  None Available. FINDINGS: No fracture or malalignment. No sizable knee effusion. Mild tricompartment arthritis of the knee worst involving the medial joint space. Joint space calcifications. IMPRESSION: 1. No acute osseous abnormality 2. Arthritis of the knee.  Chondrocalcinosis. Electronically Signed   By: 05/07/2022 M.D.   On: 03/06/2022 21:06   CT Head Wo Contrast  Result Date: 03/06/2022 CLINICAL DATA:  Head trauma EXAM: CT HEAD WITHOUT CONTRAST TECHNIQUE: Contiguous axial images were obtained from the base of the skull through the vertex without intravenous contrast. RADIATION DOSE REDUCTION: This exam was performed according to the departmental dose-optimization program which includes automated exposure control, adjustment of the mA and/or kV according to patient size and/or use of iterative reconstruction technique. COMPARISON:  CT brain 02/23/2020 FINDINGS: Brain: No acute territorial infarction, hemorrhage or intracranial mass. Patchy white matter hypodensity consistent with chronic small vessel ischemic change. Stable ventricle size. Vascular: No hyperdense vessels.  Carotid vascular calcification Skull: Normal. Negative for fracture or focal lesion.  Sinuses/Orbits: No acute finding. Other: None IMPRESSION: 1. No CT evidence for acute intracranial abnormality. 2. Mild chronic small vessel ischemic changes of the white matter. Electronically Signed   By: 02/25/2020 M.D.   On: 03/06/2022 21:05    Procedures Procedures    Medications Ordered in ED Medications  acetaminophen (TYLENOL) tablet 650 mg (650 mg Oral Given 03/06/22 2032)    ED Course/ Medical Decision Making/ A&P                           Medical Decision Making [name] [name] is a 86 y.o. female with history of hypertension, hyperlipidemia, type 2 diabetes, osteoarthritis presents with headache, lower back pain, and left knee pain after a ground-level fall while at church today.  Given that her lower back pain is mild and in the paraspinal region I am less concerned for an acute fracture in this region. Differential diagnosis includes but is not limited to  hematoma, fracture. Will order CT of the head and C-spine to assess for acute abnormalities.  Will order x-ray of the left knee and pelvis to assess for acute abnormalities.  Will order CBC and BMP to assess cell counts, electrolytes, renal function.  Will order Tylenol for pain. Patient discussed with Dr. Karene Fry.   Amount and/or Complexity of Data Reviewed Labs: ordered. Decision-making details documented in ED Course. Radiology: ordered and independent interpretation performed. Decision-making details documented in ED Course.  Risk OTC drugs.   10:26 PM The patient's head CT is negative for acute intracranial abnormality.  The x-ray of the patient's left knee is negative for acute abnormalities.  Will order CT of the left knee given tenderness at the tibial plateau.  Awaiting results of the CT C-spine, CBC, BMP.  11:18 PM  Care transferred to the night team.         Final Clinical Impression(s) / ED Diagnoses Final diagnoses:  None    Rx / DC Orders ED Discharge Orders     None         Delvon Chipps,  Gaylyn Cheers, MD 03/06/22 8546    Ernie Avena, MD 03/06/22 2356

## 2022-03-07 DIAGNOSIS — M79662 Pain in left lower leg: Secondary | ICD-10-CM | POA: Diagnosis not present

## 2022-03-07 DIAGNOSIS — S82115A Nondisplaced fracture of left tibial spine, initial encounter for closed fracture: Secondary | ICD-10-CM | POA: Diagnosis not present

## 2022-03-07 MED ORDER — OXYCODONE-ACETAMINOPHEN 5-325 MG PO TABS: 1.0000 | ORAL_TABLET | Freq: Four times a day (QID) | ORAL | 0 refills | Status: AC | PRN

## 2022-03-07 MED ORDER — OXYCODONE-ACETAMINOPHEN 5-325 MG PO TABS
1.0000 | ORAL_TABLET | Freq: Once | ORAL | Status: AC
  Administered 2022-03-07: 1 via ORAL
  Filled 2022-03-07: qty 1

## 2022-03-21 DIAGNOSIS — M25562 Pain in left knee: Secondary | ICD-10-CM | POA: Diagnosis not present

## 2022-04-11 DIAGNOSIS — J441 Chronic obstructive pulmonary disease with (acute) exacerbation: Secondary | ICD-10-CM | POA: Diagnosis not present

## 2022-04-11 DIAGNOSIS — R0789 Other chest pain: Secondary | ICD-10-CM | POA: Diagnosis not present

## 2022-04-11 DIAGNOSIS — J45909 Unspecified asthma, uncomplicated: Secondary | ICD-10-CM | POA: Diagnosis not present

## 2022-04-11 DIAGNOSIS — K219 Gastro-esophageal reflux disease without esophagitis: Secondary | ICD-10-CM | POA: Diagnosis not present

## 2022-04-11 DIAGNOSIS — Z1152 Encounter for screening for COVID-19: Secondary | ICD-10-CM | POA: Diagnosis not present

## 2022-04-11 DIAGNOSIS — K5901 Slow transit constipation: Secondary | ICD-10-CM | POA: Diagnosis not present

## 2022-04-11 DIAGNOSIS — I129 Hypertensive chronic kidney disease with stage 1 through stage 4 chronic kidney disease, or unspecified chronic kidney disease: Secondary | ICD-10-CM | POA: Diagnosis not present

## 2022-04-16 DIAGNOSIS — M25562 Pain in left knee: Secondary | ICD-10-CM | POA: Diagnosis not present

## 2022-05-02 DIAGNOSIS — M1712 Unilateral primary osteoarthritis, left knee: Secondary | ICD-10-CM | POA: Diagnosis not present

## 2022-05-09 DIAGNOSIS — M1712 Unilateral primary osteoarthritis, left knee: Secondary | ICD-10-CM | POA: Diagnosis not present

## 2022-05-16 DIAGNOSIS — M1712 Unilateral primary osteoarthritis, left knee: Secondary | ICD-10-CM | POA: Diagnosis not present

## 2022-05-29 DIAGNOSIS — E119 Type 2 diabetes mellitus without complications: Secondary | ICD-10-CM | POA: Diagnosis not present

## 2022-05-29 DIAGNOSIS — H1045 Other chronic allergic conjunctivitis: Secondary | ICD-10-CM | POA: Diagnosis not present

## 2022-05-29 DIAGNOSIS — H40013 Open angle with borderline findings, low risk, bilateral: Secondary | ICD-10-CM | POA: Diagnosis not present

## 2022-05-29 DIAGNOSIS — Z961 Presence of intraocular lens: Secondary | ICD-10-CM | POA: Diagnosis not present

## 2022-05-29 DIAGNOSIS — H04123 Dry eye syndrome of bilateral lacrimal glands: Secondary | ICD-10-CM | POA: Diagnosis not present

## 2022-07-04 DIAGNOSIS — Z23 Encounter for immunization: Secondary | ICD-10-CM | POA: Diagnosis not present

## 2022-07-04 DIAGNOSIS — I679 Cerebrovascular disease, unspecified: Secondary | ICD-10-CM | POA: Diagnosis not present

## 2022-07-04 DIAGNOSIS — E785 Hyperlipidemia, unspecified: Secondary | ICD-10-CM | POA: Diagnosis not present

## 2022-07-04 DIAGNOSIS — K219 Gastro-esophageal reflux disease without esophagitis: Secondary | ICD-10-CM | POA: Diagnosis not present

## 2022-07-04 DIAGNOSIS — M48061 Spinal stenosis, lumbar region without neurogenic claudication: Secondary | ICD-10-CM | POA: Diagnosis not present

## 2022-07-04 DIAGNOSIS — F330 Major depressive disorder, recurrent, mild: Secondary | ICD-10-CM | POA: Diagnosis not present

## 2022-07-04 DIAGNOSIS — G4700 Insomnia, unspecified: Secondary | ICD-10-CM | POA: Diagnosis not present

## 2022-07-04 DIAGNOSIS — I129 Hypertensive chronic kidney disease with stage 1 through stage 4 chronic kidney disease, or unspecified chronic kidney disease: Secondary | ICD-10-CM | POA: Diagnosis not present

## 2022-07-04 DIAGNOSIS — E1140 Type 2 diabetes mellitus with diabetic neuropathy, unspecified: Secondary | ICD-10-CM | POA: Diagnosis not present

## 2022-07-04 DIAGNOSIS — J441 Chronic obstructive pulmonary disease with (acute) exacerbation: Secondary | ICD-10-CM | POA: Diagnosis not present

## 2022-08-17 DIAGNOSIS — M25519 Pain in unspecified shoulder: Secondary | ICD-10-CM | POA: Diagnosis not present

## 2022-08-17 DIAGNOSIS — E785 Hyperlipidemia, unspecified: Secondary | ICD-10-CM | POA: Diagnosis not present

## 2022-08-17 DIAGNOSIS — I10 Essential (primary) hypertension: Secondary | ICD-10-CM | POA: Diagnosis not present

## 2022-08-17 DIAGNOSIS — E663 Overweight: Secondary | ICD-10-CM | POA: Diagnosis not present

## 2022-08-17 DIAGNOSIS — K219 Gastro-esophageal reflux disease without esophagitis: Secondary | ICD-10-CM | POA: Diagnosis not present

## 2022-08-17 DIAGNOSIS — J4520 Mild intermittent asthma, uncomplicated: Secondary | ICD-10-CM | POA: Diagnosis not present

## 2022-08-17 DIAGNOSIS — M1990 Unspecified osteoarthritis, unspecified site: Secondary | ICD-10-CM | POA: Diagnosis not present

## 2022-08-17 DIAGNOSIS — E119 Type 2 diabetes mellitus without complications: Secondary | ICD-10-CM | POA: Diagnosis not present

## 2022-08-17 DIAGNOSIS — Z79899 Other long term (current) drug therapy: Secondary | ICD-10-CM | POA: Diagnosis not present

## 2023-02-17 ENCOUNTER — Emergency Department (HOSPITAL_COMMUNITY)
Admission: EM | Admit: 2023-02-17 | Discharge: 2023-02-17 | Disposition: A | Discharge status: Home / Self Care | Payer: Medicare PPO | Attending: Emergency Medicine | Admitting: Emergency Medicine

## 2023-02-17 ENCOUNTER — Emergency Department (HOSPITAL_COMMUNITY): Payer: Medicare PPO

## 2023-02-17 ENCOUNTER — Encounter (HOSPITAL_COMMUNITY): Payer: Self-pay | Admitting: Emergency Medicine

## 2023-02-17 DIAGNOSIS — M25511 Pain in right shoulder: Secondary | ICD-10-CM | POA: Diagnosis not present

## 2023-02-17 DIAGNOSIS — M545 Low back pain, unspecified: Secondary | ICD-10-CM | POA: Diagnosis not present

## 2023-02-17 DIAGNOSIS — W19XXXA Unspecified fall, initial encounter: Secondary | ICD-10-CM

## 2023-02-17 DIAGNOSIS — W01190A Fall on same level from slipping, tripping and stumbling with subsequent striking against furniture, initial encounter: Secondary | ICD-10-CM | POA: Insufficient documentation

## 2023-02-17 DIAGNOSIS — R5383 Other fatigue: Secondary | ICD-10-CM | POA: Insufficient documentation

## 2023-02-17 DIAGNOSIS — M79604 Pain in right leg: Secondary | ICD-10-CM | POA: Diagnosis not present

## 2023-02-17 DIAGNOSIS — R4182 Altered mental status, unspecified: Secondary | ICD-10-CM | POA: Insufficient documentation

## 2023-02-17 DIAGNOSIS — M79605 Pain in left leg: Secondary | ICD-10-CM | POA: Insufficient documentation

## 2023-02-17 LAB — BASIC METABOLIC PANEL
Anion gap: 9 (ref 5–15)
BUN: 15 mg/dL (ref 8–23)
CO2: 30 mmol/L (ref 22–32)
Calcium: 9.9 mg/dL (ref 8.9–10.3)
Chloride: 96 mmol/L — ABNORMAL LOW (ref 98–111)
Creatinine, Ser: 0.98 mg/dL (ref 0.44–1.00)
GFR, Estimated: 57 mL/min — ABNORMAL LOW (ref >60)
Glucose, Bld: 202 mg/dL — ABNORMAL HIGH (ref 70–99)
Potassium: 3.9 mmol/L (ref 3.5–5.1)
Sodium: 135 mmol/L (ref 135–145)

## 2023-02-17 LAB — CBC WITH DIFFERENTIAL/PLATELET
Abs Immature Granulocytes: 0.02 10*3/uL (ref 0.00–0.07)
Basophils Absolute: 0.1 10*3/uL (ref 0.0–0.1)
Basophils Relative: 1 %
Eosinophils Absolute: 0.2 10*3/uL (ref 0.0–0.5)
Eosinophils Relative: 2 %
HCT: 41.9 % (ref 36.0–46.0)
Hemoglobin: 13 g/dL (ref 12.0–15.0)
Immature Granulocytes: 0 %
Lymphocytes Relative: 17 %
Lymphs Abs: 1.8 10*3/uL (ref 0.7–4.0)
MCH: 28.2 pg (ref 26.0–34.0)
MCHC: 31 g/dL (ref 30.0–36.0)
MCV: 90.9 fL (ref 80.0–100.0)
Monocytes Absolute: 0.9 10*3/uL (ref 0.1–1.0)
Monocytes Relative: 9 %
Neutro Abs: 7.2 10*3/uL (ref 1.7–7.7)
Neutrophils Relative %: 71 %
Platelets: 307 10*3/uL (ref 150–400)
RBC: 4.61 MIL/uL (ref 3.87–5.11)
RDW: 13.8 % (ref 11.5–15.5)
WBC: 10.1 10*3/uL (ref 4.0–10.5)
nRBC: 0 % (ref 0.0–0.2)

## 2023-02-17 LAB — URINALYSIS, ROUTINE W REFLEX MICROSCOPIC
Bilirubin Urine: NEGATIVE
Glucose, UA: NEGATIVE mg/dL
Hgb urine dipstick: NEGATIVE
Ketones, ur: NEGATIVE mg/dL
Leukocytes,Ua: NEGATIVE
Nitrite: NEGATIVE
Protein, ur: NEGATIVE mg/dL
Specific Gravity, Urine: 1.009 (ref 1.005–1.030)
pH: 6 (ref 5.0–8.0)

## 2023-02-17 LAB — CBG MONITORING, ED: Glucose-Capillary: 217 mg/dL — ABNORMAL HIGH (ref 70–99)

## 2023-02-17 LAB — TROPONIN I (HIGH SENSITIVITY): Troponin I (High Sensitivity): 4 ng/L (ref <18)

## 2023-02-17 MED ORDER — ACETAMINOPHEN 500 MG PO TABS
1000.0000 mg | ORAL_TABLET | Freq: Once | ORAL | Status: AC
  Administered 2023-02-17: 1000 mg via ORAL
  Filled 2023-02-17: qty 2

## 2023-02-17 MED ORDER — LACTATED RINGERS IV BOLUS
1000.0000 mL | Freq: Once | INTRAVENOUS | Status: AC
  Administered 2023-02-17: 1000 mL via INTRAVENOUS

## 2023-02-17 NOTE — ED Provider Notes (Signed)
[place] EMERGENCY DEPARTMENT AT [facility] Provider Note   CSN: [id number] Arrival date & time: 02/17/23  1351     History Chief Complaint  Patient presents with   Fall    HPI [name] [name] is a 87 y.o. female presenting for chief complaint ground-level fall x 2.  States that last night she was moving laundry when she felt her legs give out under her she lost her balance falling over backwards hitting her head on the door.  She waited approximately 15 minutes tried to get up before falling forward at that time.  Sat on the floor for a few minutes once again and was able to get up and make it to the recliner where she stayed overnight.  She is feeling substantially better this morning has been able to ambulate but has been too weak to make it to the kitchen to make food so has not eaten today.  Feels fatigue malaise and weakness.  Denies fevers chills nausea vomiting syncope or shortness of breath.  Otherwise in no acute distress denies any other symptoms leading into this..  Patient's recorded medical, surgical, social, medication list and allergies were reviewed in the Snapshot window as part of the initial history.   Review of Systems   Review of Systems  Constitutional:  Positive for fatigue. Negative for chills and fever.  HENT:  Negative for ear pain and sore throat.   Eyes:  Negative for pain and visual disturbance.  Respiratory:  Negative for cough and shortness of breath.   Cardiovascular:  Negative for chest pain and palpitations.  Gastrointestinal:  Negative for abdominal pain and vomiting.  Genitourinary:  Negative for dysuria and hematuria.  Musculoskeletal:  Negative for arthralgias and back pain.  Skin:  Negative for color change and rash.  Neurological:  Positive for weakness and headaches. Negative for seizures and syncope.  All other systems reviewed and are negative.   Physical Exam Updated Vital Signs BP (!) 148/80   Pulse 73   Temp 98.3 F  (36.8 C) (Oral)   Resp 16   Ht 5\' 5"  (1.651 m)   Wt 81 kg   SpO2 95%   BMI 29.72 kg/m  Physical Exam Vitals and nursing note reviewed.  Constitutional:      General: She is not in acute distress.    Appearance: She is well-developed.  HENT:     Head: Normocephalic and atraumatic.  Eyes:     Conjunctiva/sclera: Conjunctivae normal.  Cardiovascular:     Rate and Rhythm: Normal rate and regular rhythm.     Heart sounds: No murmur heard. Pulmonary:     Effort: Pulmonary effort is normal. No respiratory distress.     Breath sounds: Normal breath sounds.  Abdominal:     General: There is no distension.     Palpations: Abdomen is soft.     Tenderness: There is no abdominal tenderness. There is no right CVA tenderness or left CVA tenderness.  Musculoskeletal:        General: No swelling or tenderness. Normal range of motion.     Cervical back: Neck supple.  Skin:    General: Skin is warm and dry.  Neurological:     General: No focal deficit present.     Mental Status: She is alert and oriented to person, place, and time. Mental status is at baseline.     Cranial Nerves: No cranial nerve deficit.      ED Course/ Medical Decision Making/ A&P  Clinical Course as of 02/17/23 1843  Wynelle Link Feb 17, 2023  1843 Troponin I (High Sensitivity): 4 [CC]    Clinical Course User Index [CC] Glyn Ade, MD    Procedures Procedures   Medications Ordered in ED Medications  lactated ringers bolus 1,000 mL (0 mLs Intravenous Stopped 02/17/23 1707)  acetaminophen (TYLENOL) tablet 1,000 mg (1,000 mg Oral Given 02/17/23 1453)   Medical Decision Making:   [name] [name] is a 87 y.o. female who presented to the ED today with altered mental status detailed above.    Patient placed on continuous vitals and telemetry monitoring while in ED which was reviewed periodically.  Complete initial physical exam performed, notably the patient  was HDS in NAD.    Reviewed and confirmed nursing  documentation for past medical history, family history, social history.    Initial Assessment:   With the patient's presentation of altered mental status, most likely diagnosis is delerium 2/2 infectious etiology (UTI/CAP/URI) vs metabolic abnormality (Na/K/Mg/Ca) vs nonspecific etiology. Other diagnoses were considered including (but not limited to) CVA, ICH, intracranial mass, critical dehydration, heptatic dysfunction, uremia, hypercarbia, intoxication, endrocrine abnormality, toxidrome. These are considered less likely due to history of present illness and physical exam findings.   This is most consistent with an acute life/limb threatening illness complicated by underlying chronic conditions.  Initial Plan:  CTH to evaluate for intracranial etiology of patient's symptoms  Screening labs including CBC and Metabolic panel to evaluate for infectious or metabolic etiology of disease.  Urinalysis with reflex culture ordered to evaluate for UTI or relevant urologic/nephrologic pathology.  CXR to evaluate for structural/infectious intrathoracic pathology.  Troponin/EKG to evaluate for cardiac pathology Objective evaluation as below reviewed   Initial Study Results:   Laboratory  All laboratory results reviewed without evidence of clinically relevant pathology.    EKG EKG was reviewed independently. Rate, rhythm, axis, intervals all examined and without medically relevant abnormality. ST segments without concerns for elevations.    Radiology:  All images reviewed independently. Agree with radiology report at this time.   DG Chest Portable 1 View  Result Date: 02/17/2023 CLINICAL DATA:  Fall. EXAM: PORTABLE CHEST 1 VIEW COMPARISON:  March 03, 2015. FINDINGS: The heart size and mediastinal contours are within normal limits. Both lungs are clear. The visualized skeletal structures are unremarkable. IMPRESSION: No active disease. Electronically Signed   By: Lupita Raider M.D.   On: 02/17/2023 14:52    CT HEAD WO CONTRAST ( )  Result Date: 02/17/2023 CLINICAL DATA:  Head trauma, minor (Age >= 65y) EXAM: CT HEAD WITHOUT CONTRAST TECHNIQUE: Contiguous axial images were obtained from the base of the skull through the vertex without intravenous contrast. RADIATION DOSE REDUCTION: This exam was performed according to the departmental dose-optimization program which includes automated exposure control, adjustment of the mA and/or kV according to patient size and/or use of iterative reconstruction technique. COMPARISON:  03/06/2022 FINDINGS: Brain: No evidence of acute infarction, hemorrhage, hydrocephalus, extra-axial collection or mass lesion/mass effect. Scattered low-density changes within the periventricular and subcortical white matter most compatible with chronic microvascular ischemic change. Mild diffuse cerebral volume loss. Vascular: Atherosclerotic calcifications involving the large vessels of the skull base. No unexpected hyperdense vessel. Skull: Normal. Negative for fracture or focal lesion. Sinuses/Orbits: No acute finding. Other: None. IMPRESSION: 1. No acute intracranial findings. 2. Chronic microvascular ischemic change and cerebral volume loss. Electronically Signed   By: Duanne Guess D.O.   On: 02/17/2023 14:52    Final Assessment and Plan:  Patient's history present on his physical exam findings are not consistent with any acute pathology.  Reevaluated patient at bedside and she has had no interval symptoms. No evidence of severe traumatic injury no evidence of infectious pathology.  Likely dehydration and poor p.o. intake.  Recommended patient follow-up with her PCP for outpatient care and management. Disposition:   Based on the above findings, I believe this patient is stable for admission.    Patient/family educated about specific findings on our evaluation and explained exact reasons for admission.  Patient/family educated about clinical situation and time was allowed to  answer questions.   Admission team communicated with and agreed with need for admission. Patient admitted. Patient ready to move at this time.     Emergency Department Medication Summary:   Medications  lactated ringers bolus 1,000 mL (0 mLs Intravenous Stopped 02/17/23 1707)  acetaminophen (TYLENOL) tablet 1,000 mg (1,000 mg Oral Given 02/17/23 1453)         Clinical Impression:  1. Fall, initial encounter      Discharge   Final Clinical Impression(s) / ED Diagnoses Final diagnoses:  Fall, initial encounter    Rx / DC Orders ED Discharge Orders     None         Glyn Ade, MD 02/17/23 1844

## 2023-02-17 NOTE — ED Triage Notes (Signed)
Pt reports falling twice last night and on floor for about 45 min. States she was coming out of laundry room and her legs came out from under her. Pt fell when trying to get back up. Endorses bilateral leg pain, right shoulder pain, and lower back pain. Reports hitting back of head on door. Denies blood thinners or LOC.

## 2023-04-15 ENCOUNTER — Emergency Department (HOSPITAL_COMMUNITY)
Admission: EM | Admit: 2023-04-15 | Discharge: 2023-04-15 | Disposition: A | Discharge status: Home / Self Care | Payer: Medicare PPO | Attending: Emergency Medicine | Admitting: Emergency Medicine

## 2023-04-15 ENCOUNTER — Other Ambulatory Visit: Payer: Self-pay

## 2023-04-15 ENCOUNTER — Encounter (HOSPITAL_COMMUNITY): Payer: Self-pay

## 2023-04-15 ENCOUNTER — Emergency Department (HOSPITAL_COMMUNITY): Payer: Medicare PPO

## 2023-04-15 DIAGNOSIS — R519 Headache, unspecified: Secondary | ICD-10-CM

## 2023-04-15 DIAGNOSIS — Z794 Long term (current) use of insulin: Secondary | ICD-10-CM | POA: Diagnosis not present

## 2023-04-15 DIAGNOSIS — U071 COVID-19: Secondary | ICD-10-CM | POA: Diagnosis not present

## 2023-04-15 DIAGNOSIS — R202 Paresthesia of skin: Secondary | ICD-10-CM | POA: Diagnosis not present

## 2023-04-15 LAB — CBC WITH DIFFERENTIAL/PLATELET
Abs Immature Granulocytes: 0.02 10*3/uL (ref 0.00–0.07)
Basophils Absolute: 0 10*3/uL (ref 0.0–0.1)
Basophils Relative: 1 %
Eosinophils Absolute: 0.1 10*3/uL (ref 0.0–0.5)
Eosinophils Relative: 2 %
HCT: 43.4 % (ref 36.0–46.0)
Hemoglobin: 13.4 g/dL (ref 12.0–15.0)
Immature Granulocytes: 0 %
Lymphocytes Relative: 28 %
Lymphs Abs: 1.5 10*3/uL (ref 0.7–4.0)
MCH: 27.9 pg (ref 26.0–34.0)
MCHC: 30.9 g/dL (ref 30.0–36.0)
MCV: 90.4 fL (ref 80.0–100.0)
Monocytes Absolute: 0.5 10*3/uL (ref 0.1–1.0)
Monocytes Relative: 9 %
Neutro Abs: 3.3 10*3/uL (ref 1.7–7.7)
Neutrophils Relative %: 60 %
Platelets: 286 10*3/uL (ref 150–400)
RBC: 4.8 MIL/uL (ref 3.87–5.11)
RDW: 13.3 % (ref 11.5–15.5)
WBC: 5.4 10*3/uL (ref 4.0–10.5)
nRBC: 0 % (ref 0.0–0.2)

## 2023-04-15 LAB — COMPREHENSIVE METABOLIC PANEL
ALT: 16 U/L (ref 0–44)
AST: 13 U/L — ABNORMAL LOW (ref 15–41)
Albumin: 3.3 g/dL — ABNORMAL LOW (ref 3.5–5.0)
Alkaline Phosphatase: 81 U/L (ref 38–126)
Anion gap: 8 (ref 5–15)
BUN: 14 mg/dL (ref 8–23)
CO2: 28 mmol/L (ref 22–32)
Calcium: 9.2 mg/dL (ref 8.9–10.3)
Chloride: 97 mmol/L — ABNORMAL LOW (ref 98–111)
Creatinine, Ser: 0.91 mg/dL (ref 0.44–1.00)
GFR, Estimated: >60 mL/min (ref >60)
Glucose, Bld: 302 mg/dL — ABNORMAL HIGH (ref 70–99)
Potassium: 3.4 mmol/L — ABNORMAL LOW (ref 3.5–5.1)
Sodium: 133 mmol/L — ABNORMAL LOW (ref 135–145)
Total Bilirubin: 0.6 mg/dL (ref 0.3–1.2)
Total Protein: 7.2 g/dL (ref 6.5–8.1)

## 2023-04-15 LAB — RESP PANEL BY RT-PCR (RSV, FLU A&B, COVID)  RVPGX2
Influenza A by PCR: NEGATIVE
Influenza B by PCR: NEGATIVE
Resp Syncytial Virus by PCR: NEGATIVE
SARS Coronavirus 2 by RT PCR: POSITIVE — AB

## 2023-04-15 MED ORDER — LACTATED RINGERS IV BOLUS
500.0000 mL | Freq: Once | INTRAVENOUS | Status: AC
  Administered 2023-04-15: 500 mL via INTRAVENOUS

## 2023-04-15 MED ORDER — ONDANSETRON HCL 4 MG PO TABS: 4.0000 mg | ORAL_TABLET | Freq: Four times a day (QID) | ORAL | 0 refills | Status: AC

## 2023-04-15 MED ORDER — DIPHENHYDRAMINE HCL 50 MG/ML IJ SOLN
25.0000 mg | Freq: Once | INTRAMUSCULAR | Status: AC
Start: 2023-04-15 — End: 2023-04-15
  Administered 2023-04-15: 25 mg via INTRAVENOUS
  Filled 2023-04-15: qty 1

## 2023-04-15 MED ORDER — ACETAMINOPHEN 500 MG PO TABS
1000.0000 mg | ORAL_TABLET | Freq: Once | ORAL | Status: AC
  Administered 2023-04-15: 1000 mg via ORAL
  Filled 2023-04-15: qty 2

## 2023-04-15 NOTE — ED Notes (Signed)
Nurse received pt in bed resting with daughter at bedside. Pt stated she would like her results and to be discharged soon, advised pt we are still waiting for CT results, once in nurse contact physician to follow up with pt. Pt agreed. Call light at bedside.

## 2023-04-15 NOTE — ED Provider Notes (Signed)
Patient seen after prior EDP.  Patient unable to get MRI given presence of metal in her body.  Patient with significant IV contrast allergy.  Noncontrast CT obtained.  No significant abnormal findings found.  Patient reports that her headache is significantly improved after CT resulted.   Patient is COVID-positive.  Majority of symptoms seem likely related to acute COVID infection.  Patient is not a candidate for antiviral medication.  Patient comfortable at time of discharge.  She understands need for close outpatient follow-up.  Strict return precautions given understood.   Wynetta Fines, MD 04/15/23 2026

## 2023-04-15 NOTE — ED Provider Notes (Signed)
[place] EMERGENCY DEPARTMENT AT [facility] Provider Note   CSN: [id number] Arrival date & time: 04/15/23  1327     History  Chief Complaint  Patient presents with   Headache    [name] [name] is a 87 y.o. female.  HPI Patient presents with daughter with with the history.  She presents due to concern for headache and facial numbness. Illness began about 5 days ago, and after 2 days of cough, fatigue, she had a positive COVID test.  Since that time she has developed a headache, right-sided, with facial numbness, but no asymmetry, no vision changes, no speech difficulty, nor extremity weakness.     Home Medications Prior to Admission medications   Medication Sig Start Date End Date Taking? Authorizing Provider  albuterol (PROVENTIL HFA) 108 (90 BASE) MCG/ACT inhaler Inhale 2 puffs into the lungs every 6 (six) hours as needed for shortness of breath. 01/12/15   Doneen Poisson, MD  clonazePAM (KLONOPIN) 1 MG tablet Take 1 mg by mouth 2 (two) times daily as needed for anxiety.     [provider]  docusate sodium (STOOL SOFTENER) 100 MG capsule Take 1 capsule (100 mg total) by mouth daily as needed for mild constipation or moderate constipation. 08/04/14   Margarito Liner, MD  DULoxetine (CYMBALTA) 60 MG capsule Take 60 mg by mouth daily. 11/29/19   [provider]  gabapentin (NEURONTIN) 300 MG capsule Take 300 mg by mouth at bedtime. 02/09/15   [provider]  metFORMIN (GLUCOPHAGE) 500 MG tablet Take 500 mg by mouth 2 (two) times daily with a meal.     [provider]  NOVOLOG MIX 70/30 FLEXPEN (70-30) 100 UNIT/ML SUPN Inject 17 Units into the skin daily with supper.  02/20/13   [provider]  olopatadine (PATADAY) 0.1 % ophthalmic solution Place 1 drop into both eyes 2 (two) times daily.    [provider]  oxyCODONE-acetaminophen (PERCOCET/ROXICET) 5-325 MG tablet Take 1-2 tablets by mouth every 6 (six) hours as  needed for severe pain. 03/07/22   Ernie Avena, MD  simvastatin (ZOCOR) 40 MG tablet Take 40 mg by mouth at bedtime.    [provider]  SYMBICORT 80-4.5 MCG/ACT inhaler Inhale 1 puff into the lungs 2 (two) times daily. 10/12/14   [provider]  valsartan-hydrochlorothiazide (DIOVAN-HCT) 160-12.5 MG per tablet Take 1 tablet by mouth daily.    [provider]  Vitamin D, Ergocalciferol, (DRISDOL) 1.25 MG (50000 UNIT) CAPS capsule Take 50,000 Units by mouth once a week. 10/04/19   [provider]      Allergies    Contrast media [iodinated contrast media], Red dye #40 (allura red), Sulfa antibiotics, Aspirin, and Oxycodone-acetaminophen    Review of Systems   Review of Systems  All other systems reviewed and are negative.   Physical Exam Updated Vital Signs BP (!) 153/89   Pulse 98   Temp 97.9 F (36.6 C) (Oral)   Resp 16   Ht 5\' 5"  (1.651 m)   Wt 81.6 kg   SpO2 100%   BMI 29.95 kg/m  Physical Exam Vitals and nursing note reviewed.  Constitutional:      General: She is not in acute distress.    Appearance: She is well-developed.  HENT:     Head: Normocephalic and atraumatic.  Eyes:     Conjunctiva/sclera: Conjunctivae normal.  Cardiovascular:     Rate and Rhythm: Normal rate and regular rhythm.  Pulmonary:  Effort: Pulmonary effort is normal. No respiratory distress.     Breath sounds: No stridor.  Abdominal:     General: There is no distension.  Skin:    General: Skin is warm and dry.  Neurological:     General: No focal deficit present.     Mental Status: She is alert and oriented to person, place, and time.     Cranial Nerves: No cranial nerve deficit.     Motor: Atrophy present. No tremor or abnormal muscle tone.  Psychiatric:        Mood and Affect: Mood normal.     ED Results / Procedures / Treatments   Labs (all labs ordered are listed, but only abnormal results are displayed) Labs Reviewed  COMPREHENSIVE  METABOLIC PANEL - Abnormal; Notable for the following components:      Result Value   Sodium 133 (*)    Potassium 3.4 (*)    Chloride 97 (*)    Glucose, Bld 302 (*)    Albumin 3.3 (*)    AST 13 (*)    All other components within normal limits  CBC WITH DIFFERENTIAL/PLATELET    EKG None  Radiology No results found.  Procedures Procedures    Medications Ordered in ED Medications  lactated ringers bolus 500 mL (500 mLs Intravenous New Bag/Given 04/15/23 1439)    ED Course/ Medical Decision Making/ A&P                                 Medical Decision Making Elderly female with recent COVID diagnosis presents with new headache, facial numbness.  Patient awake, alert, hemodynamic unremarkable, but given concern for COVID effects, and neuro complaints and CT indicated.  However, patient has contrast allergy and MR was substituted for CT imaging. Labs sent monitoring started. Cardiac 95 sinus normal Pulse ox 100% room air normal   Amount and/or Complexity of Data Reviewed Independent Historian:     Details: Adult child at bedside External Data Reviewed: notes. Labs: ordered. Decision-making details documented in ED Course. Radiology: ordered and independent interpretation performed. Decision-making details documented in ED Course.  Risk Prescription drug management. Decision regarding hospitalization.   Patient in similar condition on repeat exam.  She has mild hyperglycemia otherwise labs unremarkable.  On signout the patient is awaiting MR, repeat evaluation.        Final Clinical Impression(s) / ED Diagnoses Final diagnoses:  Bad headache  Facial numbness    Rx / DC Orders ED Discharge Orders     None         Gerhard Munch, MD 04/15/23 1611

## 2023-04-15 NOTE — ED Triage Notes (Addendum)
Patient tested positive for covid Friday. Today woke up at 9am with a headache, feeling fatigued, right sided facial numbness. No different sensation in right verse left side of face, arms, legs. Feels nauseous.

## 2023-04-15 NOTE — Discharge Instructions (Signed)
Return for any problem.  ?

## 2024-02-04 ENCOUNTER — Other Ambulatory Visit: Payer: Self-pay | Admitting: Student

## 2024-02-04 DIAGNOSIS — N644 Mastodynia: Secondary | ICD-10-CM

## 2024-02-22 ENCOUNTER — Emergency Department (HOSPITAL_COMMUNITY)
Admission: EM | Admit: 2024-02-22 | Discharge: 2024-02-22 | Disposition: A | Discharge status: Home / Self Care | Attending: Student | Admitting: Student

## 2024-02-22 ENCOUNTER — Encounter (HOSPITAL_COMMUNITY): Payer: Self-pay

## 2024-02-22 DIAGNOSIS — H9209 Otalgia, unspecified ear: Secondary | ICD-10-CM | POA: Diagnosis present

## 2024-02-22 DIAGNOSIS — X58XXXA Exposure to other specified factors, initial encounter: Secondary | ICD-10-CM | POA: Insufficient documentation

## 2024-02-22 DIAGNOSIS — Z79899 Other long term (current) drug therapy: Secondary | ICD-10-CM | POA: Insufficient documentation

## 2024-02-22 DIAGNOSIS — Z794 Long term (current) use of insulin: Secondary | ICD-10-CM | POA: Insufficient documentation

## 2024-02-22 DIAGNOSIS — S0500XA Injury of conjunctiva and corneal abrasion without foreign body, unspecified eye, initial encounter: Secondary | ICD-10-CM

## 2024-02-22 DIAGNOSIS — S0502XA Injury of conjunctiva and corneal abrasion without foreign body, left eye, initial encounter: Secondary | ICD-10-CM | POA: Diagnosis not present

## 2024-02-22 DIAGNOSIS — S0501XA Injury of conjunctiva and corneal abrasion without foreign body, right eye, initial encounter: Secondary | ICD-10-CM | POA: Insufficient documentation

## 2024-02-22 DIAGNOSIS — I10 Essential (primary) hypertension: Secondary | ICD-10-CM | POA: Diagnosis not present

## 2024-02-22 DIAGNOSIS — E119 Type 2 diabetes mellitus without complications: Secondary | ICD-10-CM | POA: Insufficient documentation

## 2024-02-22 DIAGNOSIS — Z7984 Long term (current) use of oral hypoglycemic drugs: Secondary | ICD-10-CM | POA: Diagnosis not present

## 2024-02-22 DIAGNOSIS — J45909 Unspecified asthma, uncomplicated: Secondary | ICD-10-CM | POA: Insufficient documentation

## 2024-02-22 DIAGNOSIS — H5789 Other specified disorders of eye and adnexa: Secondary | ICD-10-CM | POA: Diagnosis present

## 2024-02-22 LAB — RESP PANEL BY RT-PCR (RSV, FLU A&B, COVID)  RVPGX2
Influenza A by PCR: NEGATIVE
Influenza B by PCR: NEGATIVE
Resp Syncytial Virus by PCR: NEGATIVE
SARS Coronavirus 2 by RT PCR: NEGATIVE

## 2024-02-22 MED ORDER — OFLOXACIN 0.3 % OP SOLN: OPHTHALMIC | 0 refills | Status: AC

## 2024-02-22 MED ORDER — TETRACAINE HCL 0.5 % OP SOLN
2.0000 [drp] | Freq: Once | OPHTHALMIC | Status: AC
  Administered 2024-02-22: 2 [drp] via OPHTHALMIC
  Filled 2024-02-22: qty 4

## 2024-02-22 MED ORDER — FLUORESCEIN SODIUM 1 MG OP STRP
1.0000 | ORAL_STRIP | Freq: Once | OPHTHALMIC | Status: AC
  Administered 2024-02-22: 1 via OPHTHALMIC
  Filled 2024-02-22: qty 1

## 2024-02-22 NOTE — ED Triage Notes (Signed)
 Pt c/o bilateral eye burning and drainage, bilateral ear pain, and nausea x2 days.  Pain score 7/10.  R medial sclera noted to be red.  Pt reports symptoms are much better today but have not resolved.    Pt was seen at an UC yesterday and prescribed eye drops and nausea medication which she has not picked up.

## 2024-02-22 NOTE — ED Notes (Signed)
 Visual Acuity Screening  Left eye: 20/50  Right eye: 20/30  Both eyes: 20/25

## 2024-02-22 NOTE — Discharge Instructions (Addendum)
 You are seen in the emergency department today with concerns of eye irritation and ear discomfort.  There is no evidence of any ear infection but your eyes did appear to have signs of corneal abrasions.  I have started you on antibiotic treatment to help manage your symptoms.  Please use as prescribed.  Return to the emergency department for any concerns of new or worsening symptoms.  Otherwise please follow-up with your primary care provider and ophthalmologist within 48 hours for further evaluation.

## 2024-02-22 NOTE — ED Provider Notes (Signed)
  EMERGENCY DEPARTMENT AT [facility] Provider Note   CSN: [id number] Arrival date & time: 02/22/24  1358     Patient presents with: Eye Problem and Otalgia   [name] [name] is a 88 y.o. female.  Patient presents to the emergency department with concerns of eye irritation and ear discomfort.  She reports history of hypertension, type 2 diabetes, asthma.  She states that she has been experiencing eye discomfort since yesterday that she describes as a burning feeling with drainage present from bilateral eyes.  Was seen by primary care yesterday and started on Pataday antihistamine drops.    Eye Problem Otalgia      Prior to Admission medications   Medication Sig Start Date End Date Taking? Authorizing Provider  ofloxacin (OCUFLOX) 0.3 % ophthalmic solution Apply two drops to both eyes every 6 hours for the next 5 days. 02/22/24  Yes Bonne Whack A, PA-C  albuterol  (PROVENTIL  HFA) 108 (90 BASE) MCG/ACT inhaler Inhale 2 puffs into the lungs every 6 (six) hours as needed for shortness of breath. 01/12/15   Teofilo Satterfield, MD  clonazePAM  (KLONOPIN ) 1 MG tablet Take 1 mg by mouth 2 (two) times daily as needed for anxiety.     [provider]  docusate sodium  (STOOL SOFTENER) 100 MG capsule Take 1 capsule (100 mg total) by mouth daily as needed for mild constipation or moderate constipation. 08/04/14   Lanis Harder, MD  DULoxetine (CYMBALTA) 60 MG capsule Take 60 mg by mouth daily. 11/29/19   [provider]  gabapentin  (NEURONTIN ) 300 MG capsule Take 300 mg by mouth at bedtime. 02/09/15   [provider]  metFORMIN  (GLUCOPHAGE ) 500 MG tablet Take 500 mg by mouth 2 (two) times daily with a meal.     [provider]  NOVOLOG  MIX 70/30 FLEXPEN (70-30) 100 UNIT/ML SUPN Inject 17 Units into the skin daily with supper.  02/20/13   [provider]  olopatadine (PATADAY) 0.1 % ophthalmic solution Place 1 drop into both eyes 2 (two)  times daily.    [provider]  ondansetron  (ZOFRAN ) 4 MG tablet Take 1 tablet (4 mg total) by mouth every 6 (six) hours. 04/15/23   Laurice Maude BROCKS, MD  oxyCODONE -acetaminophen  (PERCOCET/ROXICET) 5-325 MG tablet Take 1-2 tablets by mouth every 6 (six) hours as needed for severe pain. 03/07/22   Jerrol Agent, MD  simvastatin  (ZOCOR ) 40 MG tablet Take 40 mg by mouth at bedtime.    [provider]  SYMBICORT  80-4.5 MCG/ACT inhaler Inhale 1 puff into the lungs 2 (two) times daily. 10/12/14   [provider]  valsartan -hydrochlorothiazide  (DIOVAN -HCT) 160-12.5 MG per tablet Take 1 tablet by mouth daily.    [provider]  Vitamin D , Ergocalciferol , (DRISDOL) 1.25 MG (50000 UNIT) CAPS capsule Take 50,000 Units by mouth once a week. 10/04/19   [provider]    Allergies: Contrast media [iodinated contrast media], Red dye #40 (allura red), Sulfa antibiotics, Aspirin , and Oxycodone -acetaminophen     Review of Systems  HENT:  Positive for ear pain.   All other systems reviewed and are negative.   Updated Vital Signs BP (!) 175/88   Pulse (!) 55   Temp 98.9 F (37.2 C) (Oral)   Resp 18   Ht 5' 5 (1.651 m)   Wt 81.6 kg   SpO2 100%   BMI 29.95 kg/m   Physical Exam Vitals and nursing note reviewed.  Constitutional:      General: She is not  in acute distress.    Appearance: She is well-developed.  HENT:     Head: Normocephalic and atraumatic.     Comments: No evidence of any swelling, erythema, or drainage from the external auditory canal.  No tragus tenderness.  No mastoid tenderness.    Right Ear: Tympanic membrane and ear canal normal.     Left Ear: Tympanic membrane and ear canal normal.   Eyes:     General:        Right eye: Discharge present.        Left eye: Discharge present.    Conjunctiva/sclera: Conjunctivae normal.     Comments: Clear drainage bilaterally.  Injection/subconjunctival hemorrhage to the medial aspect of the right  sclera.   Cardiovascular:     Rate and Rhythm: Normal rate and regular rhythm.     Heart sounds: No murmur heard. Pulmonary:     Effort: Pulmonary effort is normal. No respiratory distress.     Breath sounds: Normal breath sounds.  Abdominal:     Palpations: Abdomen is soft.     Tenderness: There is no abdominal tenderness.   Musculoskeletal:        General: No swelling.     Cervical back: Neck supple.   Skin:    General: Skin is warm and dry.     Capillary Refill: Capillary refill takes less than 2 seconds.   Neurological:     Mental Status: She is alert.   Psychiatric:        Mood and Affect: Mood normal.     (all labs ordered are listed, but only abnormal results are displayed) Labs Reviewed  RESP PANEL BY RT-PCR (RSV, FLU A&B, COVID)  [id number]    EKG: None  Radiology: No results found.   Procedures   Medications Ordered in the ED  tetracaine (PONTOCAINE) 0.5 % ophthalmic solution 2 drop (2 drops Both Eyes Given by Other 02/22/24 1801)  fluorescein ophthalmic strip 1 strip (1 strip Both Eyes Given by Other 02/22/24 1802)                                    Medical Decision Making Risk Prescription drug management.   This patient presents to the ED for concern of eye drainage, this involves an extensive number of treatment options, and is a complaint that carries with it a high risk of complications and morbidity.  The differential diagnosis includes conjunctivitis, subconjunctival hemorrhage, viral URI, sinusitis   Co morbidities that complicate the patient evaluation  Type 2 diabetes, hypertension, hyperlipidemia, asthma   Consultations Obtained:  I requested consultation with none,  and discussed lab and imaging findings as well as pertinent plan - they recommend: N/A   Problem List / ED Course / Critical interventions / Medication management  Patient with past history significant for diabetes, hypertension, hyperlipidemia, asthma presents to the  emergency department concerns of eye pain and ear pain.  Reports has been ongoing for the last day.  States that she seen her PCP yesterday and started on Pataday antihistamine drops reports has not taken these.  Denies any prescription antibiotic drops or ointment.  No recent sick contacts.  Endorses some ear discomfort but denies any feelings of a sore throat, cough, congestion, facial pain or pressure, or fevers. On exam, there is subconjunctival hemorrhage/scleral injection towards the medial aspect of the right eye.  Left eye is large unremarkable.  Clear drainage present  bilaterally.  In the setting of continued eye rubbing, will evaluate with fluorescein staining after tetracaine used for anesthetic effect.  Suspect possible corneal abrasion. Lower sending shows 2 corneal abrasions with 1 to the left and 1 to the right eyes.  No obvious corneal ulceration seen.  No signs of keratitis or other ophthalmic injury.  Respiratory panel negative for COVID-19, influenza, RSV.  Given this finding on examination, will start patient on antibiotic ophthalmic solution.  Advise close follow-up with ophthalmologist/optometrist for repeat assessment.  Return precautions discussed such as concerns for new or worsening symptoms.  Otherwise stable this time for outpatient follow-up and discharged home. I ordered medication including tetracaine for anesthetic  Reevaluation of the patient after these medicines showed that the patient improved I have reviewed the patients home medicines and have made adjustments as needed  Test / Admission - Considered:  Patient not meeting criteria for admission.  Final diagnoses:  Corneal abrasion, unspecified laterality, initial encounter    ED Discharge Orders          Ordered    ofloxacin (OCUFLOX) 0.3 % ophthalmic solution        02/22/24 1742               Andilynn Delavega A, PA-C 02/22/24 2037    Albertina Dixon, MD 02/23/24 [phone]

## 2024-05-01 ENCOUNTER — Encounter: Payer: Self-pay | Admitting: Student

## 2024-06-02 ENCOUNTER — Encounter: Payer: Self-pay | Admitting: Podiatry

## 2024-06-02 ENCOUNTER — Ambulatory Visit: Admitting: Podiatry

## 2024-06-02 DIAGNOSIS — B351 Tinea unguium: Secondary | ICD-10-CM | POA: Diagnosis not present

## 2024-06-02 DIAGNOSIS — M2041 Other hammer toe(s) (acquired), right foot: Secondary | ICD-10-CM

## 2024-06-02 DIAGNOSIS — M79674 Pain in right toe(s): Secondary | ICD-10-CM

## 2024-06-02 DIAGNOSIS — M2042 Other hammer toe(s) (acquired), left foot: Secondary | ICD-10-CM | POA: Diagnosis not present

## 2024-06-02 DIAGNOSIS — E119 Type 2 diabetes mellitus without complications: Secondary | ICD-10-CM

## 2024-06-02 DIAGNOSIS — Z0189 Encounter for other specified special examinations: Secondary | ICD-10-CM | POA: Diagnosis not present

## 2024-06-02 DIAGNOSIS — E1151 Type 2 diabetes mellitus with diabetic peripheral angiopathy without gangrene: Secondary | ICD-10-CM

## 2024-06-02 DIAGNOSIS — M79675 Pain in left toe(s): Secondary | ICD-10-CM

## 2024-06-02 NOTE — Progress Notes (Signed)
 Subjective: [name] [name] presents today for diabetic foot evaluation. Chief Complaint  Patient presents with   Diabetes    Patient stated that her last A1c was 255, Her PCP name is Damian Muck and she last saw her in September 2025.    Patient denies any h/o foot wounds.  PCP is Campbell Damian, NP.  Past Medical History:  Diagnosis Date   Anxiety    Asthma    Benign paroxysmal positional vertigo 11/05/2012   CAD (coronary artery disease)    Cervical spondylosis 03/26/2012   CHF (congestive heart failure) (HCC)    Complication of anesthesia    AGE 45 APPENDECTOMY WOKE UP, NO TROUBLE SINCE   Diabetes mellitus    Difficult intubation    no mri,  has pins in ears   GERD (gastroesophageal reflux disease)    Hypertension    Obesity    Shortness of breath     Patient Active Problem List   Diagnosis Date Noted   Primary localized osteoarthritis of right knee 09/30/2015   Atypical chest pain    Right knee pain 11/17/2014   Osteoarthritis 09/16/2014   Fall at home 09/16/2014   Acute low back pain 08/30/2014   Skin abnormality 07/30/2013   Hypercalcemia 04/08/2013   Lumbar spondylosis with myelopathy 06/24/2012   Preventative health care 06/24/2012   Cervical spondylosis 03/26/2012   CHRONIC RHINITIS 06/02/2010   Type 2 diabetes mellitus, controlled (HCC) 06/22/2009   Hyperlipidemia 06/22/2009   Essential hypertension 06/22/2009   RESPIRATORY FAILURE, CHRONIC 06/07/2009   GERD 06/07/2009   Asthma 01/07/2008    Past Surgical History:  Procedure Laterality Date   ABDOMINAL HYSTERECTOMY     APPENDECTOMY     CATARACT EXTRACTION W/ INTRAOCULAR LENS  IMPLANT, BILATERAL     CESAREAN SECTION     CHOLECYSTECTOMY     ear pins bilaterally     LUMBAR LAMINECTOMY  2002   ROTATOR CUFF REPAIR  2003   Right    (X2)   TOTAL KNEE ARTHROPLASTY Right 09/30/2015   Procedure: RIGHT TOTAL KNEE ARTHROPLASTY;  Surgeon: Toribio Silos, MD;  Location: [facility] OR;  Service: Orthopedics;   Laterality: Right;    Current Outpatient Medications on File Prior to Visit  Medication Sig Dispense Refill   albuterol  (PROVENTIL  HFA) 108 (90 BASE) MCG/ACT inhaler Inhale 2 puffs into the lungs every 6 (six) hours as needed for shortness of breath. 6 Inhaler 0   clonazePAM  (KLONOPIN ) 1 MG tablet Take 1 mg by mouth 2 (two) times daily as needed for anxiety.      docusate sodium  (STOOL SOFTENER) 100 MG capsule Take 1 capsule (100 mg total) by mouth daily as needed for mild constipation or moderate constipation. 30 capsule 3   DULoxetine (CYMBALTA) 60 MG capsule Take 60 mg by mouth daily.     gabapentin  (NEURONTIN ) 300 MG capsule Take 300 mg by mouth at bedtime.  5   metFORMIN  (GLUCOPHAGE ) 500 MG tablet Take 500 mg by mouth 2 (two) times daily with a meal.      NOVOLOG  MIX 70/30 FLEXPEN (70-30) 100 UNIT/ML SUPN Inject 17 Units into the skin daily with supper.      ofloxacin  (OCUFLOX ) 0.3 % ophthalmic solution Apply two drops to both eyes every 6 hours for the next 5 days. 5 mL 0   olopatadine (PATADAY) 0.1 % ophthalmic solution Place 1 drop into both eyes 2 (two) times daily.     ondansetron  (ZOFRAN ) 4 MG tablet Take 1 tablet (4  mg total) by mouth every 6 (six) hours. 12 tablet 0   oxyCODONE -acetaminophen  (PERCOCET/ROXICET) 5-325 MG tablet Take 1-2 tablets by mouth every 6 (six) hours as needed for severe pain. 15 tablet 0   simvastatin  (ZOCOR ) 40 MG tablet Take 40 mg by mouth at bedtime.     SYMBICORT  80-4.5 MCG/ACT inhaler Inhale 1 puff into the lungs 2 (two) times daily.  3   valsartan -hydrochlorothiazide  (DIOVAN -HCT) 160-12.5 MG per tablet Take 1 tablet by mouth daily.     Vitamin D , Ergocalciferol , (DRISDOL) 1.25 MG (50000 UNIT) CAPS capsule Take 50,000 Units by mouth once a week.     No current facility-administered medications on file prior to visit.     Allergies  Allergen Reactions   Contrast Media [Iodinated Contrast Media] Shortness Of Breath and Rash    13 hour prep   Red Dye  #40 (Allura Red) Hives   Sulfa Antibiotics Swelling   Aspirin  Other (See Comments)     heart palpitations   Oxycodone -Acetaminophen  Rash    Social History   Occupational History   Not on file  Tobacco Use   Smoking status: Never   Smokeless tobacco: Never  Vaping Use   Vaping status: Never Used  Substance and Sexual Activity   Alcohol use: No    Alcohol/week: 0.0 standard drinks of alcohol   Drug use: No   Sexual activity: Not on file    Family History  Problem Relation Age of Onset   Cancer Mother        Question pancreatic   Coronary artery disease Father    Breast cancer Sister 71   Coronary artery disease Sister 32   Breast cancer Cousin    Colon cancer Neg Hx     Immunization History  Administered Date(s) Administered   Influenza Split 06/01/2011, 06/24/2012   Influenza Whole 05/27/2008, 06/21/2009, 05/27/2010   Influenza,inj,Quad PF,6+ Mos 08/05/2013, 04/29/2014, 06/18/2016   PFIZER(Purple Top)SARS-COV-2 Vaccination 11/16/2019, 12/09/2019   Pneumococcal Conjugate-13 11/16/2014   Pneumococcal Polysaccharide-23 08/28/2007   Tdap 08/04/2014    Objective: There were no vitals filed for this visit.  [name] [name] is a pleasant 88 y.o. female in NAD. AAO X 3.   Diabetic foot exam was performed with the following findings:   No deformities, ulcerations, or other skin breakdown Normal sensation of 10g monofilament Vascular Examination: CFT less than 3 seconds. DP pulses palpable b/l. PT pulses nonpalpable b/l. Digital hair absent. Skin temperature gradient warm to warn b/l. No ischemia or gangrene. No cyanosis or clubbing noted b/l. No edema noted b/l LE.   Neurological Examination: Sensation grossly intact b/l with 10 gram monofilament. Vibratory sensation intact b/l.   Dermatological Examination: Pedal skin thin, shiny and atrophic b/l. No open wounds. No interdigital macerations.   Toenails 1-5 b/l thick, discolored, elongated with subungual debris  and pain on dorsal palpation.   No hyperkeratotic nor porokeratotic lesions.  Musculoskeletal Examination: Muscle strength 5/5 to all lower extremity muscle groups bilaterally. No pain, crepitus or joint limitation noted with ROM b/l LE. Hammertoe deformity noted 2-5 b/l.[name] [name] Patient ambulates with cane assistance.  Radiographs: None      Lab Results  Component Value Date   HGBA1C 6.8 (H) 08/15/2015   Assessment: 1. Pain due to onychomycosis of toenails of both feet   2. Acquired hammertoes of both feet   3. Type II diabetes mellitus with peripheral circulatory disorder (HCC)   4. Encounter for diabetic foot exam (HCC)      ADA  Risk Categorization: High Risk:  Patient has one or more of the following: Loss of protective sensation Absent pedal pulses Severe Foot deformity History of foot ulcer  Plan: Diabetic foot examination performed today. All patient's and/or POA's questions/concerns addressed on today's visit. Toenails 1-5 debrided in length and girth without incident. Continue foot and shoe inspections daily. Monitor blood glucose per PCP/Endocrinologist's recommendations. Continue soft, supportive shoe gear daily. Report any pedal injuries to medical professional. -Shoe recommendations given for Skechers with stretchable uppers to avoid dorsal corn formation due to hammertoe deformity. -Patient/POA to call should there be question/concern in the interim.  Return in about 3 months (around 09/02/2024).  Delon LITTIE Merlin, DPM      [place] LOCATION: [address], [place] [address]                   Office [phone]   [facility] LOCATION: [address] [place], [place] [address] Office [phone]

## 2024-06-07 ENCOUNTER — Encounter: Payer: Self-pay | Admitting: Podiatry

## 2024-08-04 ENCOUNTER — Emergency Department (HOSPITAL_COMMUNITY)

## 2024-08-04 ENCOUNTER — Emergency Department (HOSPITAL_COMMUNITY)
Admission: EM | Admit: 2024-08-04 | Discharge: 2024-08-04 | Disposition: A | Discharge status: Home / Self Care | Attending: Emergency Medicine | Admitting: Emergency Medicine

## 2024-08-04 DIAGNOSIS — M5441 Lumbago with sciatica, right side: Secondary | ICD-10-CM

## 2024-08-04 DIAGNOSIS — W19XXXA Unspecified fall, initial encounter: Secondary | ICD-10-CM

## 2024-08-04 LAB — URINALYSIS, ROUTINE W REFLEX MICROSCOPIC
Bilirubin Urine: NEGATIVE
Glucose, UA: 150 mg/dL — AB
Hgb urine dipstick: NEGATIVE
Ketones, ur: NEGATIVE mg/dL
Leukocytes,Ua: NEGATIVE
Nitrite: NEGATIVE
Protein, ur: 100 mg/dL — AB
Specific Gravity, Urine: 1.018 (ref 1.005–1.030)
pH: 5 (ref 5.0–8.0)

## 2024-08-04 LAB — CBC
HCT: 41.7 % (ref 36.0–46.0)
Hemoglobin: 13.2 g/dL (ref 12.0–15.0)
MCH: 27.6 pg (ref 26.0–34.0)
MCHC: 31.7 g/dL (ref 30.0–36.0)
MCV: 87.1 fL (ref 80.0–100.0)
Platelets: 299 K/uL (ref 150–400)
RBC: 4.79 MIL/uL (ref 3.87–5.11)
RDW: 14.3 % (ref 11.5–15.5)
WBC: 8.3 K/uL (ref 4.0–10.5)
nRBC: 0 % (ref 0.0–0.2)

## 2024-08-04 LAB — I-STAT CHEM 8, ED
BUN: 23 mg/dL (ref 8–23)
Calcium, Ion: 0.92 mmol/L — ABNORMAL LOW (ref 1.15–1.40)
Chloride: 105 mmol/L (ref 98–111)
Creatinine, Ser: 0.7 mg/dL (ref 0.44–1.00)
Glucose, Bld: 265 mg/dL — ABNORMAL HIGH (ref 70–99)
HCT: 41 % (ref 36.0–46.0)
Hemoglobin: 13.9 g/dL (ref 12.0–15.0)
Potassium: 3.8 mmol/L (ref 3.5–5.1)
Sodium: 134 mmol/L — ABNORMAL LOW (ref 135–145)
TCO2: 25 mmol/L (ref 22–32)

## 2024-08-04 LAB — ETHANOL: Alcohol, Ethyl (B): <15 mg/dL (ref <15)

## 2024-08-04 LAB — COMPREHENSIVE METABOLIC PANEL WITH GFR
ALT: 12 U/L (ref 0–44)
AST: 11 U/L — ABNORMAL LOW (ref 15–41)
Albumin: 3.2 g/dL — ABNORMAL LOW (ref 3.5–5.0)
Alkaline Phosphatase: 66 U/L (ref 38–126)
Anion gap: 10 (ref 5–15)
BUN: 17 mg/dL (ref 8–23)
CO2: 28 mmol/L (ref 22–32)
Calcium: 10.3 mg/dL (ref 8.9–10.3)
Chloride: 100 mmol/L (ref 98–111)
Creatinine, Ser: 0.87 mg/dL (ref 0.44–1.00)
GFR, Estimated: >60 mL/min (ref >60)
Glucose, Bld: 262 mg/dL — ABNORMAL HIGH (ref 70–99)
Potassium: 3.8 mmol/L (ref 3.5–5.1)
Sodium: 138 mmol/L (ref 135–145)
Total Bilirubin: 1.2 mg/dL (ref 0.0–1.2)
Total Protein: 6.6 g/dL (ref 6.5–8.1)

## 2024-08-04 LAB — TROPONIN I (HIGH SENSITIVITY)
Troponin I (High Sensitivity): 11 ng/L (ref <18)
Troponin I (High Sensitivity): 11 ng/L (ref <18)

## 2024-08-04 LAB — CK: Total CK: 41 U/L (ref 38–234)

## 2024-08-04 LAB — MAGNESIUM: Magnesium: 1.5 mg/dL — ABNORMAL LOW (ref 1.7–2.4)

## 2024-08-04 MED ORDER — MAGNESIUM SULFATE 2 GM/50ML IV SOLN
2.0000 g | Freq: Once | INTRAVENOUS | Status: AC
  Administered 2024-08-04: 2 g via INTRAVENOUS
  Filled 2024-08-04: qty 50

## 2024-08-04 MED ORDER — IRBESARTAN 150 MG PO TABS
150.0000 mg | ORAL_TABLET | Freq: Every day | ORAL | Status: DC
  Administered 2024-08-04: 150 mg via ORAL
  Filled 2024-08-04: qty 1

## 2024-08-04 MED ORDER — PREDNISONE 10 MG (21) PO TBPK: ORAL_TABLET | Freq: Every day | ORAL | 0 refills | Status: AC

## 2024-08-04 MED ORDER — METHOCARBAMOL 500 MG PO TABS: 500.0000 mg | ORAL_TABLET | Freq: Three times a day (TID) | ORAL | 0 refills | Status: AC | PRN

## 2024-08-04 MED ORDER — LACTATED RINGERS IV BOLUS
500.0000 mL | Freq: Once | INTRAVENOUS | Status: AC
  Administered 2024-08-04: 500 mL via INTRAVENOUS

## 2024-08-04 MED ORDER — VALSARTAN-HYDROCHLOROTHIAZIDE 160-12.5 MG PO TABS: 1.0000 | ORAL_TABLET | Freq: Every day | ORAL | Status: DC

## 2024-08-04 MED ORDER — LIDOCAINE 5 % EX PTCH
1.0000 | MEDICATED_PATCH | CUTANEOUS | Status: DC
  Administered 2024-08-04: 1 via TRANSDERMAL
  Filled 2024-08-04: qty 1

## 2024-08-04 MED ORDER — ONDANSETRON HCL 4 MG/2ML IJ SOLN
4.0000 mg | Freq: Once | INTRAMUSCULAR | Status: AC
  Administered 2024-08-04: 4 mg via INTRAVENOUS
  Filled 2024-08-04: qty 2

## 2024-08-04 MED ORDER — FENTANYL CITRATE (PF) 50 MCG/ML IJ SOSY
50.0000 ug | PREFILLED_SYRINGE | Freq: Once | INTRAMUSCULAR | Status: AC
  Administered 2024-08-04: 50 ug via INTRAVENOUS
  Filled 2024-08-04: qty 1

## 2024-08-04 MED ORDER — METHOCARBAMOL 500 MG PO TABS
500.0000 mg | ORAL_TABLET | Freq: Once | ORAL | Status: AC
  Administered 2024-08-04: 500 mg via ORAL
  Filled 2024-08-04: qty 1

## 2024-08-04 MED ORDER — HYDROCODONE-ACETAMINOPHEN 5-325 MG PO TABS: 2.0000 | ORAL_TABLET | Freq: Four times a day (QID) | ORAL | 0 refills | Status: AC | PRN

## 2024-08-04 MED ORDER — LIDOCAINE 5 % EX PTCH: 1.0000 | MEDICATED_PATCH | CUTANEOUS | 0 refills | Status: AC

## 2024-08-04 MED ORDER — HYDROCHLOROTHIAZIDE 12.5 MG PO TABS
12.5000 mg | ORAL_TABLET | Freq: Every day | ORAL | Status: DC
  Administered 2024-08-04: 12.5 mg via ORAL
  Filled 2024-08-04: qty 1

## 2024-08-04 NOTE — ED Provider Notes (Signed)
 [place] EMERGENCY DEPARTMENT AT [facility] Provider Note   CSN: [id number] Arrival date & time: 08/04/24  1429     Patient presents with: No chief complaint on file.   [name] [name] is a 88 y.o. female.   HPI Patient presents for multiple complaints.  Medical history includes DM, CHF, HTN, GERD, CAD, asthma, anxiety, BPPV, HLD, arthritis.  She has reportedly had generalized weakness over the past month.  She has had 2 falls in the last 2 weeks.  Most recently, she fell 6 days ago.  Since her last fall, she has had right-sided low back pain which radiates to right leg.  The baseline, she walks with a walker but walking has been more difficult due to the pain.  She has had intermittent headache pain.  She went to her primary care doctor's office today.  No workup was performed there.  She was sent to the ED for further evaluation.  Patient takes meloxicam at home for pain.  Per chart review, she has also been prescribed gabapentin , and Percocet.  Patient is accompanied by her daughter who assists in history.    Prior to Admission medications   Medication Sig Start Date End Date Taking? Authorizing Provider  HYDROcodone -acetaminophen  (NORCO/VICODIN) 5-325 MG tablet Take 2 tablets by mouth every 6 (six) hours as needed for up to 3 days. 08/04/24 08/07/24 Yes Melvenia Motto, MD  lidocaine  (LIDODERM ) 5 % Place 1 patch onto the skin daily. Remove & Discard patch within 12 hours or as directed by MD 08/04/24  Yes Melvenia Motto, MD  methocarbamol  (ROBAXIN ) 500 MG tablet Take 1 tablet (500 mg total) by mouth every 8 (eight) hours as needed for muscle spasms. 08/04/24  Yes Melvenia Motto, MD  predniSONE  (STERAPRED UNI-PAK 21 TAB) 10 MG (21) TBPK tablet Take by mouth daily. Take 6 tabs by mouth daily  for 2 days, then 5 tabs for 2 days, then 4 tabs for 2 days, then 3 tabs for 2 days, 2 tabs for 2 days, then 1 tab by mouth daily for 2 days 08/04/24  Yes Melvenia Motto, MD  albuterol  (PROVENTIL  HFA)  108 (90 BASE) MCG/ACT inhaler Inhale 2 puffs into the lungs every 6 (six) hours as needed for shortness of breath. 01/12/15   Teofilo Satterfield, MD  clonazePAM  (KLONOPIN ) 1 MG tablet Take 1 mg by mouth 2 (two) times daily as needed for anxiety.     [provider]  docusate sodium  (STOOL SOFTENER) 100 MG capsule Take 1 capsule (100 mg total) by mouth daily as needed for mild constipation or moderate constipation. 08/04/14   Lanis Harder, MD  DULoxetine (CYMBALTA) 60 MG capsule Take 60 mg by mouth daily. 11/29/19   [provider]  gabapentin  (NEURONTIN ) 300 MG capsule Take 300 mg by mouth at bedtime. 02/09/15   [provider]  metFORMIN  (GLUCOPHAGE ) 500 MG tablet Take 500 mg by mouth 2 (two) times daily with a meal.     [provider]  NOVOLOG  MIX 70/30 FLEXPEN (70-30) 100 UNIT/ML SUPN Inject 17 Units into the skin daily with supper.  02/20/13   [provider]  ofloxacin  (OCUFLOX ) 0.3 % ophthalmic solution Apply two drops to both eyes every 6 hours for the next 5 days. 02/22/24   Zelaya, Oscar A, PA-C  olopatadine (PATADAY) 0.1 % ophthalmic solution Place 1 drop into both eyes 2 (two) times daily.    [provider]  ondansetron  (ZOFRAN ) 4 MG tablet Take 1 tablet (4 mg  total) by mouth every 6 (six) hours. 04/15/23   Laurice Maude BROCKS, MD  oxyCODONE -acetaminophen  (PERCOCET/ROXICET) 5-325 MG tablet Take 1-2 tablets by mouth every 6 (six) hours as needed for severe pain. 03/07/22   Jerrol Agent, MD  simvastatin  (ZOCOR ) 40 MG tablet Take 40 mg by mouth at bedtime.    [provider]  SYMBICORT  80-4.5 MCG/ACT inhaler Inhale 1 puff into the lungs 2 (two) times daily. 10/12/14   [provider]  valsartan -hydrochlorothiazide  (DIOVAN -HCT) 160-12.5 MG per tablet Take 1 tablet by mouth daily.    [provider]  Vitamin D , Ergocalciferol , (DRISDOL) 1.25 MG (50000 UNIT) CAPS capsule Take 50,000 Units by mouth once a week. 10/04/19   [provider]    Allergies: Contrast media [iodinated contrast media], Red dye #40 (allura red), Sulfa antibiotics, Aspirin , and Oxycodone -acetaminophen     Review of Systems  Musculoskeletal:  Positive for arthralgias and back pain.  Neurological:  Positive for weakness (Generalized) and headaches.  All other systems reviewed and are negative.   Updated Vital Signs BP (!) 176/82   Pulse 74   Temp 98 F (36.7 C)   Resp 16   SpO2 97%   Physical Exam Vitals and nursing note reviewed.  Constitutional:      General: She is not in acute distress.    Appearance: Normal appearance. She is well-developed. She is not ill-appearing, toxic-appearing or diaphoretic.  HENT:     Head: Normocephalic and atraumatic.     Right Ear: External ear normal.     Left Ear: External ear normal.     Nose: Nose normal.     Mouth/Throat:     Mouth: Mucous membranes are moist.  Eyes:     Extraocular Movements: Extraocular movements intact.     Conjunctiva/sclera: Conjunctivae normal.  Cardiovascular:     Rate and Rhythm: Normal rate and regular rhythm.  Pulmonary:     Effort: Pulmonary effort is normal. No respiratory distress.  Abdominal:     General: There is no distension.     Palpations: Abdomen is soft.     Tenderness: There is no abdominal tenderness.  Musculoskeletal:        General: Tenderness present. No swelling or deformity.     Cervical back: Normal range of motion and neck supple.  Skin:    General: Skin is warm and dry.     Coloration: Skin is not jaundiced or pale.  Neurological:     General: No focal deficit present.     Mental Status: She is alert and oriented to person, place, and time.  Psychiatric:        Mood and Affect: Mood normal.        Behavior: Behavior normal.     (all labs ordered are listed, but only abnormal results are displayed) Labs Reviewed  COMPREHENSIVE METABOLIC PANEL WITH GFR - Abnormal; Notable for the following components:      Result Value    Glucose, Bld 262 (*)    Albumin 3.2 (*)    AST 11 (*)    All other components within normal limits  URINALYSIS, ROUTINE W REFLEX MICROSCOPIC - Abnormal; Notable for the following components:   APPearance HAZY (*)    Glucose, UA 150 (*)    Protein, ur 100 (*)    Bacteria, UA RARE (*)    All other components within normal limits  MAGNESIUM  - Abnormal; Notable for the following components:   Magnesium  1.5 (*)    All other  components within normal limits  I-STAT CHEM 8, ED - Abnormal; Notable for the following components:   Sodium 134 (*)    Glucose, Bld 265 (*)    Calcium, Ion 0.92 (*)    All other components within normal limits  CBC  ETHANOL  CK  TROPONIN I (HIGH SENSITIVITY)  TROPONIN I (HIGH SENSITIVITY)    EKG: EKG Interpretation Date/Time:  Tuesday August 04 2024 15:26:01 EST Ventricular Rate:  94 PR Interval:  218 QRS Duration:  86 QT Interval:  329 QTC Calculation: 412 R Axis:   -43  Text Interpretation: Sinus rhythm Ventricular premature complex Prolonged PR interval Left axis deviation ST elevation, consider inferior injury Artifact in lead(s) I III aVR aVL aVF V2 V6 Confirmed by Melvenia Motto [phone]) on 08/04/2024 4:23:47 PM  Radiology: CT L-SPINE NO CHARGE Result Date: 08/04/2024 CLINICAL DATA:  Trauma. EXAM: CT THORACIC AND LUMBAR SPINE WITHOUT CONTRAST TECHNIQUE: Multidetector CT imaging of the thoracic and lumbar spine was performed without contrast. Multiplanar CT image reconstructions were also generated. RADIATION DOSE REDUCTION: This exam was performed according to the departmental dose-optimization program which includes automated exposure control, adjustment of the mA and/or kV according to patient size and/or use of iterative reconstruction technique. COMPARISON:  Lumbar spine CT dated 06/09/2020. FINDINGS: CT THORACIC SPINE FINDINGS Alignment: No acute subluxation. Vertebrae: No acute fracture. Paraspinal and other soft tissues: Negative. Disc levels: No acute  findings.  Degenerative changes. CT LUMBAR SPINE FINDINGS Segmentation: 5 lumbar type vertebra. Alignment: No acute subluxation. Vertebrae: No acute fracture.  Osteopenia. Paraspinal and other soft tissues: Negative. Disc levels: Multilevel degenerative changes and disc desiccation with vacuum phenomena. IMPRESSION: 1. No acute/traumatic thoracic or lumbar spine pathology. 2. Multilevel degenerative changes. Electronically Signed   By: Vanetta Chou M.D.   On: 08/04/2024 17:48   CT T-SPINE NO CHARGE Result Date: 08/04/2024 CLINICAL DATA:  Trauma. EXAM: CT THORACIC AND LUMBAR SPINE WITHOUT CONTRAST TECHNIQUE: Multidetector CT imaging of the thoracic and lumbar spine was performed without contrast. Multiplanar CT image reconstructions were also generated. RADIATION DOSE REDUCTION: This exam was performed according to the departmental dose-optimization program which includes automated exposure control, adjustment of the mA and/or kV according to patient size and/or use of iterative reconstruction technique. COMPARISON:  Lumbar spine CT dated 06/09/2020. FINDINGS: CT THORACIC SPINE FINDINGS Alignment: No acute subluxation. Vertebrae: No acute fracture. Paraspinal and other soft tissues: Negative. Disc levels: No acute findings.  Degenerative changes. CT LUMBAR SPINE FINDINGS Segmentation: 5 lumbar type vertebra. Alignment: No acute subluxation. Vertebrae: No acute fracture.  Osteopenia. Paraspinal and other soft tissues: Negative. Disc levels: Multilevel degenerative changes and disc desiccation with vacuum phenomena. IMPRESSION: 1. No acute/traumatic thoracic or lumbar spine pathology. 2. Multilevel degenerative changes. Electronically Signed   By: Vanetta Chou M.D.   On: 08/04/2024 17:48   CT CHEST ABDOMEN PELVIS WO CONTRAST Result Date: 08/04/2024 CLINICAL DATA:  Trauma. EXAM: CT CHEST, ABDOMEN AND PELVIS WITHOUT CONTRAST TECHNIQUE: Multidetector CT imaging of the chest, abdomen and pelvis was performed  following the standard protocol without IV contrast. RADIATION DOSE REDUCTION: This exam was performed according to the departmental dose-optimization program which includes automated exposure control, adjustment of the mA and/or kV according to patient size and/or use of iterative reconstruction technique. COMPARISON:  CT abdomen pelvis dated 08/16/2014. FINDINGS: Evaluation of this exam is limited in the absence of intravenous contrast. CT CHEST FINDINGS Cardiovascular: There is no cardiomegaly or pericardial effusion. Three-vessel coronary vascular calcification. Mild atherosclerotic calcification of the  thoracic aorta. No aneurysmal dilatation. The central pulmonary arteries are grossly unremarkable. Mediastinum/Nodes: No hilar or mediastinal adenopathy. The esophagus is grossly unremarkable. No mediastinal fluid collection. Lungs/Pleura: No focal consolidation, pleural effusion, or pneumothorax. The central airways are patent. Musculoskeletal: Degenerative changes of the spine. No acute osseous pathology. CT ABDOMEN PELVIS FINDINGS No intra-abdominal free air or free fluid. Hepatobiliary: The liver is unremarkable. No biliary ductal dilatation. Cholecystectomy. No retained calcified stone noted in the central CBD. Pancreas: Unremarkable. No pancreatic ductal dilatation or surrounding inflammatory changes. Spleen: Normal in size without focal abnormality. Adrenals/Urinary Tract: The adrenal glands are unremarkable there is no hydronephrosis or nephrolithiasis on either side. The visualized ureters and urinary bladder appear unremarkable. Stomach/Bowel: There is moderate sigmoid diverticulosis. There is no bowel obstruction or active inflammation. Appendectomy. Vascular/Lymphatic: Mild aortoiliac atherosclerotic disease. The IVC is unremarkable. No portal venous gas. There is no adenopathy. Reproductive: Hysterectomy.  No suspicious adnexal masses. Other: None Musculoskeletal: Multilevel degenerative changes with  multilevel disc desiccation and vacuum phenomena. No acute osseous pathology. IMPRESSION: 1. No acute/traumatic intrathoracic, abdominal, or pelvic pathology. 2. Sigmoid diverticulosis. No bowel obstruction. 3.  Aortic Atherosclerosis (ICD10-I70.0). Electronically Signed   By: Vanetta Chou M.D.   On: 08/04/2024 17:42   CT CERVICAL SPINE WO CONTRAST Result Date: 08/04/2024 CLINICAL DATA:  Clemens, generalized weakness EXAM: CT CERVICAL SPINE WITHOUT CONTRAST TECHNIQUE: Multidetector CT imaging of the cervical spine was performed without intravenous contrast. Multiplanar CT image reconstructions were also generated. RADIATION DOSE REDUCTION: This exam was performed according to the departmental dose-optimization program which includes automated exposure control, adjustment of the mA and/or kV according to patient size and/or use of iterative reconstruction technique. COMPARISON:  03/06/2022 FINDINGS: Alignment: Stable reversal of cervical lordosis and mild left convex scoliosis, likely due to multilevel degenerative changes and patient positioning. Skull base and vertebrae: No acute fracture. No primary bone lesion or focal pathologic process. Soft tissues and spinal canal: No prevertebral fluid or swelling. No visible canal hematoma. Disc levels: Severe multilevel cervical spondylosis and facet hypertrophy are again identified. Facet hypertrophic changes are most pronounced from C2-3 through C4-5. Disc space narrowing and osteophyte formation are greatest at the C5-6 and C6-7 levels. Mild progression since prior study. Upper chest: Airway is patent.  Lung apices are clear. Other: Reconstructed images demonstrate no additional findings. IMPRESSION: 1. No acute cervical spine fracture. 2. Diffuse multilevel cervical degenerative changes, with slight progression since prior study. Electronically Signed   By: Ozell Daring M.D.   On: 08/04/2024 17:37   CT HEAD WO CONTRAST Result Date: 08/04/2024 CLINICAL DATA:   Clemens, generalized weakness, head trauma EXAM: CT HEAD WITHOUT CONTRAST TECHNIQUE: Contiguous axial images were obtained from the base of the skull through the vertex without intravenous contrast. RADIATION DOSE REDUCTION: This exam was performed according to the departmental dose-optimization program which includes automated exposure control, adjustment of the mA and/or kV according to patient size and/or use of iterative reconstruction technique. COMPARISON:  04/15/2023 FINDINGS: Brain: Scattered hypodensities throughout the periventricular white matter consistent with chronic small vessel ischemic changes. No evidence of acute infarct or hemorrhage. The lateral ventricles and midline structures are unremarkable. No acute extra-axial fluid collections. No mass effect. Vascular: Stable atherosclerosis.  No hyperdense vessel. Skull: Normal. Negative for fracture or focal lesion. Sinuses/Orbits: No acute finding. Other: None. IMPRESSION: 1. No acute intracranial process. Electronically Signed   By: Ozell Daring M.D.   On: 08/04/2024 17:34     Procedures   Medications Ordered in  the ED  lidocaine  (LIDODERM ) 5 % 1 patch (1 patch Transdermal Patch Applied 08/04/24 1658)  irbesartan  (AVAPRO ) tablet 150 mg (150 mg Oral Given 08/04/24 1736)  hydrochlorothiazide  (HYDRODIURIL ) tablet 12.5 mg (12.5 mg Oral Given 08/04/24 1736)  fentaNYL  (SUBLIMAZE ) injection 50 mcg (50 mcg Intravenous Given 08/04/24 1658)  methocarbamol  (ROBAXIN ) tablet 500 mg (500 mg Oral Given 08/04/24 1658)  ondansetron  (ZOFRAN ) injection 4 mg (4 mg Intravenous Given 08/04/24 1658)  lactated ringers  bolus 500 mL (0 mLs Intravenous Stopped 08/04/24 1820)  magnesium  sulfate IVPB 2 g 50 mL (0 g Intravenous Stopped 08/04/24 1820)                                    Medical Decision Making Amount and/or Complexity of Data Reviewed Labs: ordered. Radiology: ordered.  Risk Prescription drug management.   This patient presents to the ED for  concern of fall, this involves an extensive number of treatment options, and is a complaint that carries with it a high risk of complications and morbidity.  The differential diagnosis includes acute injuries   Co morbidities / Chronic conditions that complicate the patient evaluation  DM, CHF, HTN, GERD, CAD, asthma, anxiety, BPPV, HLD, arthritis   Additional history obtained:  Additional history obtained from EMR External records from outside source obtained and reviewed including patient's daughter   Lab Tests:  I Ordered, and personally interpreted labs.  The pertinent results include: Normal hemoglobin, no leukocytosis, normal kidney function, hypomagnesemia with otherwise normal electrolytes, normal troponin.  Glucose mildly elevated with no evidence of DKA.  No evidence of UTI.     Imaging Studies ordered:  I ordered imaging studies including CT of head, cervical spine, chest, abdomen, pelvis, T-spine, L-spine  I independently visualized and interpreted imaging which showed no acute findings  I agree with the radiologist interpretation   Cardiac Monitoring: / EKG:  The patient was maintained on a cardiac monitor.  I personally viewed and interpreted the cardiac monitored which showed an underlying rhythm of: sinus rhythm   Problem List / ED Course / Critical interventions / Medication management  Patient presenting for pain in areas of right lower back, right hip, right proximal lower extremity, headache.  This pain has worsened since her fall 6 days ago.  On arrival in the ED, she is overall well-appearing.  No deformities are noted on exam.  Her range of motion of her right hip is limited due to the pain.  She does have intact distal sensation.  Lower extremity is warm and well-perfused.  Multimodal pain control was ordered.  Workup was initiated.  Lab work was reassuring.  Imaging studies did not show any acute injuries.  Patient's blood pressure improved while in the ED.   She did have improved pain as well.  Patient to be prescribed multimodal pain medication for home in addition to steroid taper given her right sided low back pain with sciatica.  She is stable for discharge. I ordered medication including IV fluids for hydration, magnesium  sulfate for hypomagnesemia lidocaine  patch, Robaxin , fentanyl  for analgesia; Zofran  for nausea; home blood pressure medications for hypertension Reevaluation of the patient after these medicines showed that the patient improved I have reviewed the patients home medicines and have made adjustments as needed  Social Determinants of Health:  Lives at home with family     Final diagnoses:  Fall, initial encounter  Acute right-sided low back pain  with right-sided sciatica    ED Discharge Orders          Ordered    lidocaine  (LIDODERM ) 5 %  Every 24 hours        08/04/24 2103    methocarbamol  (ROBAXIN ) 500 MG tablet  Every 8 hours PRN        08/04/24 2103    HYDROcodone -acetaminophen  (NORCO/VICODIN) 5-325 MG tablet  Every 6 hours PRN        08/04/24 2103    predniSONE  (STERAPRED UNI-PAK 21 TAB) 10 MG (21) TBPK tablet  Daily        08/04/24 2103               Melvenia Motto, MD 08/04/24 2104

## 2024-08-04 NOTE — ED Triage Notes (Signed)
 Pt bib ems from [facility] with reports of generalized weakness and FTT over the last month. Has had 2 falls since thanksgiving. Complaining of headache and leg pain. Hx dementia. Lives with daughter, takes care of her own meds. 180/100 CBG 336

## 2024-08-04 NOTE — Discharge Instructions (Addendum)
 Prescriptions were sent to your pharmacy: -Prednisone  is a steroid to lower inflammation and hopefully improve your back pain.  Take this in tapering dose as prescribed. -Lidocaine  patches can be switched daily.  Avoid having more than 1 patch on your skin. -Methocarbamol  is a muscle relaxer.  Take this as needed. -Hydrocodone  is a narcotic pain medication.  Take this only as needed.  Follow-up with your primary care doctor.  Return to the emergency department for any new or worsening symptoms of concern.

## 2024-09-15 ENCOUNTER — Ambulatory Visit: Admitting: Podiatry

## 2024-09-15 DIAGNOSIS — Z91199 Patient's noncompliance with other medical treatment and regimen due to unspecified reason: Secondary | ICD-10-CM

## 2024-09-15 NOTE — Progress Notes (Signed)
 1. No-show for appointment
